# Patient Record
Sex: Male | Born: 1980 | Race: Black or African American | Hispanic: No | Marital: Single | Smoking: Never smoker
Health system: Southern US, Community
[De-identification: ages and names within clinical notes are randomized; demographics above are authoritative.]

## PROBLEM LIST (undated history)

## (undated) DIAGNOSIS — T8859XA Other complications of anesthesia, initial encounter: Secondary | ICD-10-CM

## (undated) DIAGNOSIS — I10 Essential (primary) hypertension: Secondary | ICD-10-CM

## (undated) DIAGNOSIS — T4145XA Adverse effect of unspecified anesthetic, initial encounter: Secondary | ICD-10-CM

## (undated) DIAGNOSIS — I8390 Asymptomatic varicose veins of unspecified lower extremity: Secondary | ICD-10-CM

## (undated) HISTORY — DX: Asymptomatic varicose veins of unspecified lower extremity: I83.90

## (undated) HISTORY — PX: OTHER SURGICAL HISTORY: SHX169

---

## 2012-09-01 ENCOUNTER — Encounter (HOSPITAL_COMMUNITY): Payer: Self-pay | Admitting: Pharmacy Technician

## 2012-09-01 ENCOUNTER — Encounter (HOSPITAL_COMMUNITY): Payer: Self-pay

## 2012-09-01 ENCOUNTER — Encounter (HOSPITAL_COMMUNITY)
Admission: RE | Admit: 2012-09-01 | Discharge: 2012-09-01 | Disposition: A | Discharge status: Home / Self Care | Payer: Self-pay | Source: Ambulatory Visit | Attending: Orthopaedic Surgery | Admitting: Orthopaedic Surgery

## 2012-09-01 HISTORY — DX: Adverse effect of unspecified anesthetic, initial encounter: T41.45XA

## 2012-09-01 HISTORY — DX: Other complications of anesthesia, initial encounter: T88.59XA

## 2012-09-01 LAB — URINALYSIS, ROUTINE W REFLEX MICROSCOPIC
Ketones, ur: NEGATIVE mg/dL
Leukocytes, UA: NEGATIVE
Nitrite: NEGATIVE
Urobilinogen, UA: 1 mg/dL (ref 0.0–1.0)
pH: 6 (ref 5.0–8.0)

## 2012-09-01 LAB — CBC
MCH: 29 pg (ref 26.0–34.0)
MCV: 89.1 fL (ref 78.0–100.0)
Platelets: 254 10*3/uL (ref 150–400)
RDW: 12.6 % (ref 11.5–15.5)

## 2012-09-01 LAB — COMPREHENSIVE METABOLIC PANEL
AST: 22 U/L (ref 0–37)
Albumin: 3.4 g/dL — ABNORMAL LOW (ref 3.5–5.2)
CO2: 25 mEq/L (ref 19–32)
Calcium: 9.4 mg/dL (ref 8.4–10.5)
Creatinine, Ser: 0.89 mg/dL (ref 0.50–1.35)
GFR calc non Af Amer: >90 mL/min (ref >90)

## 2012-09-01 LAB — PROTIME-INR: INR: 0.98 (ref 0.00–1.49)

## 2012-09-01 NOTE — Progress Notes (Signed)
Pt doesn't have a cardiologist   Denies ever having a stress test/echo/heart cath  [facility] is Medical MD facility and sees Mady Gemma PA  Denies ekg or cxr within past

## 2012-09-01 NOTE — Pre-Procedure Instructions (Signed)
KOLBIE LEPKOWSKI  09/01/2012   Your procedure is scheduled on:  Thurs, Feb 6 @ 1:00 PM  Report to [facility] at 11:00 AM.  Call this number if you have problems the morning of surgery: [phone]   Remember:   Do not eat food or drink liquids after midnight.   Take these medicines the morning of surgery with A SIP OF WATER: Pain Pill(if needed)   Do not wear jewelry  Do not wear lotions, powders, or colognes. You may wear deodorant.  Men may shave face and neck.  Do not bring valuables to the hospital.  Contacts, dentures or bridgework may not be worn into surgery.  Leave suitcase in the car. After surgery it may be brought to your room.  For patients admitted to the hospital, checkout time is 11:00 AM the day of  discharge.   Patients discharged the day of surgery will not be allowed to drive  home.    Special Instructions: Shower using CHG 2 nights before surgery and the night before surgery.  If you shower the day of surgery use CHG.  Use special wash - you have one bottle of CHG for all showers.  You should use approximately 1/3 of the bottle for each shower.   Please read over the following fact sheets that you were given: Pain Booklet, Coughing and Deep Breathing, MRSA Information and Surgical Site Infection Prevention

## 2012-09-01 NOTE — Progress Notes (Signed)
09/01/12 1506  OBSTRUCTIVE SLEEP APNEA  Have you ever been diagnosed with sleep apnea through a sleep study? No  Do you snore loudly (loud enough to be heard through closed doors)?  1  Do you often feel tired, fatigued, or sleepy during the daytime? 0  Has anyone observed you stop breathing during your sleep? 0  Do you have, or are you being treated for high blood pressure? 0  BMI more than 35 kg/m2? 1  Age over 31 years old? 0  Neck circumference greater than 40 cm/18 inches? 1 (20)  Gender: 1  Obstructive Sleep Apnea Score 4   Score 4 or greater  Results sent to PCP

## 2012-09-01 NOTE — Progress Notes (Signed)
Dr.Singer notified of pt hx with anesthesia at age 32;he spoke with pt and no new orders obtained;ok to proceed with planned surgery

## 2012-09-02 ENCOUNTER — Encounter (HOSPITAL_COMMUNITY): Payer: Self-pay | Admitting: Anesthesiology

## 2012-09-02 ENCOUNTER — Encounter (HOSPITAL_COMMUNITY)
Admission: RE | Disposition: A | Discharge status: Home / Self Care | Payer: Self-pay | Source: Ambulatory Visit | Attending: Orthopaedic Surgery

## 2012-09-02 ENCOUNTER — Ambulatory Visit (HOSPITAL_COMMUNITY): Payer: No Typology Code available for payment source | Admitting: Anesthesiology

## 2012-09-02 ENCOUNTER — Ambulatory Visit (HOSPITAL_COMMUNITY)
Admission: RE | Admit: 2012-09-02 | Discharge: 2012-09-02 | Disposition: A | Discharge status: Home / Self Care | Payer: No Typology Code available for payment source | Source: Ambulatory Visit | Attending: Orthopaedic Surgery | Admitting: Orthopaedic Surgery

## 2012-09-02 ENCOUNTER — Encounter (HOSPITAL_COMMUNITY): Payer: Self-pay | Admitting: *Deleted

## 2012-09-02 DIAGNOSIS — S82899A Other fracture of unspecified lower leg, initial encounter for closed fracture: Secondary | ICD-10-CM | POA: Insufficient documentation

## 2012-09-02 DIAGNOSIS — W19XXXA Unspecified fall, initial encounter: Secondary | ICD-10-CM | POA: Insufficient documentation

## 2012-09-02 DIAGNOSIS — Y9229 Other specified public building as the place of occurrence of the external cause: Secondary | ICD-10-CM | POA: Insufficient documentation

## 2012-09-02 DIAGNOSIS — S82843A Displaced bimalleolar fracture of unspecified lower leg, initial encounter for closed fracture: Secondary | ICD-10-CM

## 2012-09-02 DIAGNOSIS — Y998 Other external cause status: Secondary | ICD-10-CM | POA: Insufficient documentation

## 2012-09-02 DIAGNOSIS — Z6841 Body Mass Index (BMI) 40.0 and over, adult: Secondary | ICD-10-CM | POA: Insufficient documentation

## 2012-09-02 HISTORY — PX: ORIF ANKLE FRACTURE: SHX5408

## 2012-09-02 SURGERY — OPEN REDUCTION INTERNAL FIXATION (ORIF) ANKLE FRACTURE
Anesthesia: General | Site: Leg Lower | Laterality: Left | Wound class: Clean

## 2012-09-02 MED ORDER — GLYCOPYRROLATE 0.2 MG/ML IJ SOLN
INTRAMUSCULAR | Status: DC | PRN
  Administered 2012-09-02: .6 mg via INTRAVENOUS

## 2012-09-02 MED ORDER — ROCURONIUM BROMIDE 100 MG/10ML IV SOLN
INTRAVENOUS | Status: DC | PRN
  Administered 2012-09-02: 50 mg via INTRAVENOUS

## 2012-09-02 MED ORDER — PROPOFOL 10 MG/ML IV BOLUS
INTRAVENOUS | Status: DC | PRN
  Administered 2012-09-02: 350 mg via INTRAVENOUS
  Administered 2012-09-02: 50 mg via INTRAVENOUS

## 2012-09-02 MED ORDER — HYDROMORPHONE HCL PF 1 MG/ML IJ SOLN
INTRAMUSCULAR | Status: AC
  Filled 2012-09-02: qty 1

## 2012-09-02 MED ORDER — BUPIVACAINE-EPINEPHRINE 0.5% -1:200000 IJ SOLN
INTRAMUSCULAR | Status: DC | PRN
  Administered 2012-09-02: 25 mL

## 2012-09-02 MED ORDER — ONDANSETRON HCL 4 MG/2ML IJ SOLN: 4.0000 mg | Freq: Once | INTRAMUSCULAR | Status: DC | PRN

## 2012-09-02 MED ORDER — ARTIFICIAL TEARS OP OINT
TOPICAL_OINTMENT | OPHTHALMIC | Status: DC | PRN
  Administered 2012-09-02: 1 via OPHTHALMIC

## 2012-09-02 MED ORDER — ONDANSETRON HCL 4 MG/2ML IJ SOLN
INTRAMUSCULAR | Status: DC | PRN
  Administered 2012-09-02: 4 mg via INTRAVENOUS

## 2012-09-02 MED ORDER — OXYCODONE-ACETAMINOPHEN 5-325 MG PO TABS: 1.0000 | ORAL_TABLET | ORAL | Status: DC | PRN

## 2012-09-02 MED ORDER — OXYCODONE HCL 5 MG PO TABS: 5.0000 mg | ORAL_TABLET | Freq: Once | ORAL | Status: DC | PRN

## 2012-09-02 MED ORDER — MIDAZOLAM HCL 5 MG/5ML IJ SOLN
INTRAMUSCULAR | Status: DC | PRN
  Administered 2012-09-02: 2 mg via INTRAVENOUS

## 2012-09-02 MED ORDER — VANCOMYCIN HCL 10 G IV SOLR
1500.0000 mg | INTRAVENOUS | Status: AC
  Administered 2012-09-02: 1500 mg via INTRAVENOUS
  Filled 2012-09-02: qty 1500

## 2012-09-02 MED ORDER — SUCCINYLCHOLINE CHLORIDE 20 MG/ML IJ SOLN
INTRAMUSCULAR | Status: DC | PRN
  Administered 2012-09-02: 100 mg via INTRAVENOUS

## 2012-09-02 MED ORDER — HYDROMORPHONE HCL PF 1 MG/ML IJ SOLN
0.2500 mg | INTRAMUSCULAR | Status: DC | PRN
  Administered 2012-09-02 (×2): 0.5 mg via INTRAVENOUS

## 2012-09-02 MED ORDER — FENTANYL CITRATE 0.05 MG/ML IJ SOLN
INTRAMUSCULAR | Status: DC | PRN
  Administered 2012-09-02: 100 ug via INTRAVENOUS
  Administered 2012-09-02: 150 ug via INTRAVENOUS
  Administered 2012-09-02: 100 ug via INTRAVENOUS
  Administered 2012-09-02 (×3): 50 ug via INTRAVENOUS

## 2012-09-02 MED ORDER — 0.9 % SODIUM CHLORIDE (POUR BTL) OPTIME
TOPICAL | Status: DC | PRN
  Administered 2012-09-02: 1000 mL

## 2012-09-02 MED ORDER — NEOSTIGMINE METHYLSULFATE 1 MG/ML IJ SOLN
INTRAMUSCULAR | Status: DC | PRN
  Administered 2012-09-02: 5 mg via INTRAVENOUS

## 2012-09-02 MED ORDER — SODIUM CHLORIDE 0.9 % IV SOLN: INTRAVENOUS | Status: DC

## 2012-09-02 MED ORDER — OXYCODONE HCL 5 MG/5ML PO SOLN: 5.0000 mg | Freq: Once | ORAL | Status: DC | PRN

## 2012-09-02 MED ORDER — LACTATED RINGERS IV SOLN
INTRAVENOUS | Status: DC
  Administered 2012-09-02 (×2): via INTRAVENOUS

## 2012-09-02 MED ORDER — SODIUM CHLORIDE 0.9 % IV SOLN
INTRAVENOUS | Status: DC | PRN
  Administered 2012-09-02: 14:00:00 via INTRAVENOUS

## 2012-09-02 MED ORDER — ACETAMINOPHEN 10 MG/ML IV SOLN
INTRAVENOUS | Status: AC
  Filled 2012-09-02: qty 100

## 2012-09-02 MED ORDER — ACETAMINOPHEN 10 MG/ML IV SOLN
1000.0000 mg | Freq: Once | INTRAVENOUS | Status: AC
  Administered 2012-09-02: 1000 mg via INTRAVENOUS
  Filled 2012-09-02: qty 100

## 2012-09-02 MED ORDER — BUPIVACAINE-EPINEPHRINE PF 0.5-1:200000 % IJ SOLN
INTRAMUSCULAR | Status: AC
  Filled 2012-09-02: qty 30

## 2012-09-02 SURGICAL SUPPLY — 79 items
BANDAGE ELASTIC 4 VELCRO ST LF (GAUZE/BANDAGES/DRESSINGS) IMPLANT
BANDAGE ELASTIC 6 VELCRO ST LF (GAUZE/BANDAGES/DRESSINGS) IMPLANT
BANDAGE ESMARK 6X9 LF (GAUZE/BANDAGES/DRESSINGS) ×1 IMPLANT
BANDAGE GAUZE ELAST BULKY 4 IN (GAUZE/BANDAGES/DRESSINGS) ×4 IMPLANT
BIT DRILL 2.5X2.75 QC CALB (BIT) ×1 IMPLANT
BIT DRILL 3.5X5.5 QC CALB (BIT) ×1 IMPLANT
BIT DRILL CALIBRATED 2.7 (BIT) ×1 IMPLANT
BLADE SURG 10 STRL SS (BLADE) ×3 IMPLANT
BLADE SURG 15 STRL LF DISP TIS (BLADE) IMPLANT
BLADE SURG 15 STRL SS (BLADE) ×4
BNDG CMPR 9X6 STRL LF SNTH (GAUZE/BANDAGES/DRESSINGS) ×1
BNDG COHESIVE 6X5 TAN STRL LF (GAUZE/BANDAGES/DRESSINGS) ×1 IMPLANT
BNDG ESMARK 6X9 LF (GAUZE/BANDAGES/DRESSINGS) ×2
CLOTH BEACON ORANGE TIMEOUT ST (SAFETY) ×2 IMPLANT
COVER SURGICAL LIGHT HANDLE (MISCELLANEOUS) ×2 IMPLANT
CUFF TOURNIQUET SINGLE 34IN LL (TOURNIQUET CUFF) IMPLANT
CUFF TOURNIQUET SINGLE 44IN (TOURNIQUET CUFF) ×1 IMPLANT
DRAPE OEC MINIVIEW 54X84 (DRAPES) ×2 IMPLANT
DRAPE ORTHO SPLIT 77X108 STRL (DRAPES) ×2
DRAPE SURG ORHT 6 SPLT 77X108 (DRAPES) IMPLANT
DRSG ADAPTIC 3X8 NADH LF (GAUZE/BANDAGES/DRESSINGS) IMPLANT
DRSG PAD ABDOMINAL 8X10 ST (GAUZE/BANDAGES/DRESSINGS) IMPLANT
DURAPREP 26ML APPLICATOR (WOUND CARE) ×2 IMPLANT
ELECT REM PT RETURN 9FT ADLT (ELECTROSURGICAL) ×2
ELECTRODE REM PT RTRN 9FT ADLT (ELECTROSURGICAL) ×1 IMPLANT
FACESHIELD LNG OPTICON STERILE (SAFETY) ×1 IMPLANT
FIXATION ZIPTIGHT ANKLE SNDSMS (Ankle) IMPLANT
GAUZE XEROFORM 5X9 LF (GAUZE/BANDAGES/DRESSINGS) ×1 IMPLANT
GLOVE BIOGEL PI IND STRL 6.5 (GLOVE) IMPLANT
GLOVE BIOGEL PI IND STRL 7.0 (GLOVE) IMPLANT
GLOVE BIOGEL PI IND STRL 8 (GLOVE) ×1 IMPLANT
GLOVE BIOGEL PI IND STRL 8.5 (GLOVE) IMPLANT
GLOVE BIOGEL PI INDICATOR 6.5 (GLOVE) ×1
GLOVE BIOGEL PI INDICATOR 7.0 (GLOVE) ×1
GLOVE BIOGEL PI INDICATOR 8 (GLOVE) ×1
GLOVE BIOGEL PI INDICATOR 8.5 (GLOVE) ×2
GLOVE ECLIPSE 8.0 STRL XLNG CF (GLOVE) ×2 IMPLANT
GLOVE ECLIPSE 8.5 STRL (GLOVE) ×1 IMPLANT
GOWN PREVENTION PLUS XLARGE (GOWN DISPOSABLE) ×4 IMPLANT
GOWN STRL NON-REIN LRG LVL3 (GOWN DISPOSABLE) ×2 IMPLANT
KIT BASIN OR (CUSTOM PROCEDURE TRAY) ×2 IMPLANT
KIT ROOM TURNOVER OR (KITS) ×2 IMPLANT
MANIFOLD NEPTUNE II (INSTRUMENTS) ×1 IMPLANT
NDL KEITH (NEEDLE) IMPLANT
NEEDLE 22X1 1/2 (OR ONLY) (NEEDLE) IMPLANT
NEEDLE KEITH (NEEDLE) IMPLANT
NS IRRIG 1000ML POUR BTL (IV SOLUTION) ×2 IMPLANT
PACK ORTHO EXTREMITY (CUSTOM PROCEDURE TRAY) ×2 IMPLANT
PAD ARMBOARD 7.5X6 YLW CONV (MISCELLANEOUS) ×4 IMPLANT
PAD CAST 4YDX4 CTTN HI CHSV (CAST SUPPLIES) IMPLANT
PADDING CAST COTTON 4X4 STRL (CAST SUPPLIES) ×4
PLATE LOCK 7H 92 BILAT FIB (Plate) ×1 IMPLANT
SCREW CORTICAL 3.5MM  20MM (Screw) ×1 IMPLANT
SCREW CORTICAL 3.5MM 20MM (Screw) IMPLANT
SCREW LOCK CORT STAR 3.5X14 (Screw) ×1 IMPLANT
SCREW LOW PROFILE 18MMX3.5MM (Screw) ×1 IMPLANT
SPONGE GAUZE 4X4 12PLY (GAUZE/BANDAGES/DRESSINGS) IMPLANT
SPONGE LAP 4X18 X RAY DECT (DISPOSABLE) ×3 IMPLANT
STAPLER VISISTAT 35W (STAPLE) IMPLANT
STRIP CLOSURE SKIN 1/2X4 (GAUZE/BANDAGES/DRESSINGS) IMPLANT
SUCTION FRAZIER TIP 10 FR DISP (SUCTIONS) ×2 IMPLANT
SUT ETHIBOND CT1 BRD 2-0 30IN (SUTURE) IMPLANT
SUT ETHIBOND NAB BRD #0 18IN (SUTURE) IMPLANT
SUT MNCRL AB 3-0 PS2 18 (SUTURE) ×1 IMPLANT
SUT PROLENE 3 0 PS 2 (SUTURE) IMPLANT
SUT VIC AB 0 CT1 18XCR BRD 8 (SUTURE) IMPLANT
SUT VIC AB 0 CT1 8-18 (SUTURE) ×2
SUT VIC AB 2-0 CT1 27 (SUTURE)
SUT VIC AB 2-0 CT1 TAPERPNT 27 (SUTURE) IMPLANT
SUT VIC AB 2-0 CTB1 (SUTURE) IMPLANT
SUT VIC AB 3-0 FS2 27 (SUTURE) IMPLANT
SUT VIC AB 3-0 PS2 18 (SUTURE)
SUT VIC AB 3-0 PS2 18XBRD (SUTURE) IMPLANT
SYR CONTROL 10ML LL (SYRINGE) IMPLANT
TOWEL OR 17X24 6PK STRL BLUE (TOWEL DISPOSABLE) ×2 IMPLANT
TOWEL OR 17X26 10 PK STRL BLUE (TOWEL DISPOSABLE) ×2 IMPLANT
TUBE CONNECTING 12X1/4 (SUCTIONS) ×2 IMPLANT
WATER STERILE IRR 1000ML POUR (IV SOLUTION) ×2 IMPLANT
ZIPTIGHT ANKLE SYNODESMOSS FIX (Ankle) ×2 IMPLANT

## 2012-09-02 NOTE — Op Note (Signed)
NAMECHALMERS, IDDINGS NO.:  [id number]  MEDICAL RECORD NO.:  [id number]  LOCATION:  [facility]                         FACILITY:  [facility]  PHYSICIAN:  Claude Manges. Mckenna Gamm, M.D.DATE OF BIRTH:  07/23/1981  DATE OF PROCEDURE:  09/02/2012 DATE OF DISCHARGE:  09/02/2012                              OPERATIVE REPORT   PREOPERATIVE DIAGNOSIS:  Displaced left distal fibular fracture associated with diastasis with distal tib-fib articulation and morbid obesity with BMI 62.  POSTOPERATIVE DIAGNOSIS:  Displaced left distal fibular fracture associated with diastasis with distal tib-fib articulation and morbid obesity with BMI 62.  PROCEDURE:  Open reduction and internal fixation, left distal tib-fib fracture with fixation of tib-fib diastasis.  SURGEON:  Claude Manges. Cleophas Dunker, M.D.  ASSISTANT:  Arlys John D. Petrarca, P.A.-C.  ANESTHESIA:  General.  COMPLICATIONS:  None.  HISTORY:  This morbidly obese 32 year old young man sustained injury to his left ankle at a convenience store one week ago.  He apparently fell with acute onset of pain, was seen at the [facility] emergency room.  X-rays revealed a displaced distal fibular fracture associated with diastasis.  Because of his size he was referred to our office for treatment at [facility].  He is here for open reduction and internal fixation.  PROCEDURE IN DETAIL:  Mr. Garciagarcia was met in the holding area, identified the left leg as appropriate operative site.  He did have a splint in place.  I removed the splint.  Skin was intact.  The patient was then transported to room #10 and placed under general anesthesia without complication. A bolster was placed beneath his left hip.  We applied a tourniquet to his left leg proximally.  The left leg was then prepped with chlorhexidine scrub and then DuraPrep from the tourniquet to the tips of the toes.  Sterile draping was performed.  The extremity was still elevated,  Esmarch exsanguinated with a proximal tourniquet at 325 mmHg.  Longitudinal incision was made along the distal fibula via sharp dissection, carried down to subcutaneous tissue.  There were multiple small varicosities that were identified and Bovie coagulated.  The deep fascia was incised.  Peroneal tendons were identified and retracted posteriorly.  At that point, the fascia overlying the fibula was identified and carefully incised.  Retractors were placed above the fibula.  Fracture was visualized and reduced under direct visualization, and maintained with a bone clamp.  We elected to use the Ace/DePuy 7-hole titanium side plate, this was affixed to the fibula with the bone clamps.  X-rays reveal excellent position.  Because of the diastasis, we elected to use the very last hole in the plate to fuse the tight rope device.  Three holes were placed proximal to the fracture and 3 distally.  The very first hole of the distal 4 was placed directly over the fracture. We did insert a trans-reduction screw obliquely across the fracture, over drilling the proximal cortex using a 3.5 nonlocking screw.  We checked the position of the plate, thought it was in perfect position.  The 3 holes were then drilled, measured, and filled with screws proximally and then distally.  The 2 holes were drilled, measured,  and filled with the locking screws.  X-rays reveal excellent position of the fracture.  The 1 screw hole was left open at the level of the fracture.  The very distal screw hole was used for tight rope device.  The tight rope guide pin was then inserted through the distal hole across the fibula across the tibia and checked with image intensification, was in perfect position parallel to the ankle joint.  We then delivered the tight rope device through the fibula and the tibia.  We made a small hole medially to view the distal tibia and place the tightrope device on the periosteum and the foot  was dorsiflexed and everted.  We rechecked x-rays and thought that the diastasis was perfectly reduced and then we tightened the tight rope in place over the screw hole.  At that point, we again checked the films in excellent position.  Fracture was stable.  Wound was irrigated with saline solution.  I closed the small half-inch medial incision with 3-0 Monocryl and 2 clips.  We closed the lateral incision anatomically with 0 Vicryl and deep fascia with 3-0 Monocryl, and then skin clips.  The tourniquet was deflated prior to wound closure.  The small bleeders were Bovie coagulated.  There was immediate capillary refill to the toes.  I infiltrated the wounds with 2.5% Marcaine with epinephrine.  A sterile bulky dressing was applied followed by fluff dressing and then U-splints and posterior splint.  The patient tolerated the procedure without complications.  If he is stable in the recovery room, I will plan to send him home on oxycodone for pain.  He has crutches and we will plan to see him in the office in 4-5 days.     Claude Manges. Cleophas Dunker, M.D.     PWW/MEDQ  D:  09/02/2012  T:  09/02/2012  Job:  [id number]

## 2012-09-02 NOTE — Op Note (Signed)
PATIENT ID:      Bryan [name]  MRN:     [id number] DOB/AGE:    May 23, 1981 / 31 y.o.       OPERATIVE REPORT    DATE OF PROCEDURE:  09/02/2012       PREOPERATIVE DIAGNOSIS:   displaced left distal tibia/fibula with diastasis distal tib-fib                                                        Morbid obesity with BMI 62                                                       There is no height or weight on file to calculate BMI.     POSTOPERATIVE DIAGNOSIS:   displaced left distal tibia/fibula with diastasis                                                                      There is no height or weight on file to calculate BMI.                                                            Same   PROCEDURE:  Procedure(s): OPEN REDUCTION INTERNAL FIXATION (ORIF) ANKLE FRACTURE DISTAL FIBULA FRACTURE WITH FIXATION OF TIB-FIB DIASTASIS     SURGEON:  Norlene Campbell, MD    ASSISTANT:   Jacqualine Code, PA-C   (Present and scrubbed throughout the case, critical for assistance with exposure, retraction, instrumentation, and closure.)          ANESTHESIA: general     DRAINS: none :      TOURNIQUET TIME:  Total Tourniquet Time Documented: Calf (Left) - 71 minutes    COMPLICATIONS:  None   CONDITION:  stable  PROCEDURE IN DETAIL: [id number]   Bryan [name] 09/02/2012, 3:44 PM

## 2012-09-02 NOTE — Transfer of Care (Signed)
Immediate Anesthesia Transfer of Care Note  Patient: Bryan [name]  Procedure(s) Performed: Procedure(s) (LRB) with comments: OPEN REDUCTION INTERNAL FIXATION (ORIF) ANKLE FRACTURE (Left) - OPEN REDUCTION INTERNAL FIXATION LEFT DISTAL FIBULA FRACTURE WITH REDUCTION FIXATION DISTAL TIBIA/FIBULAR DIASTASIS   Patient Location: PACU  Anesthesia Type:General  Level of Consciousness: awake, alert , oriented and patient cooperative  Airway & Oxygen Therapy: Patient Spontanous Breathing and Patient connected to face mask oxygen  Post-op Assessment: Report given to PACU RN and Post -op Vital signs reviewed and stable  Post vital signs: Reviewed and stable  Complications: No apparent anesthesia complications

## 2012-09-02 NOTE — Anesthesia Preprocedure Evaluation (Addendum)
Anesthesia Evaluation  Patient identified by MRN, date of birth, ID band Patient awake    Reviewed: Allergy & Precautions, H&P , NPO status , Patient's Chart, lab work & pertinent test results  Airway Mallampati: I TM Distance: >3 FB     Dental  (+) Teeth Intact and Dental Advisory Given   Pulmonary neg pulmonary ROS,  breath sounds clear to auscultation        Cardiovascular negative cardio ROS  Rhythm:Regular Rate:Normal     Neuro/Psych negative neurological ROS  negative psych ROS   GI/Hepatic negative GI ROS, Neg liver ROS,   Endo/Other  Morbid obesity  Renal/GU negative Renal ROS     Musculoskeletal negative musculoskeletal ROS (+)   Abdominal   Peds  Hematology negative hematology ROS (+)   Anesthesia Other Findings   Reproductive/Obstetrics                          Anesthesia Physical Anesthesia Plan  ASA: III  Anesthesia Plan: General   Post-op Pain Management:    Induction: Intravenous  Airway Management Planned: Oral ETT and Video Laryngoscope Planned  Additional Equipment:   Intra-op Plan:   Post-operative Plan: Extubation in OR  Informed Consent: I have reviewed the patients History and Physical, chart, labs and discussed the procedure including the risks, benefits and alternatives for the proposed anesthesia with the patient or authorized representative who has indicated his/her understanding and acceptance.   Dental advisory given  Plan Discussed with: CRNA, Anesthesiologist and Surgeon  Anesthesia Plan Comments:         Anesthesia Quick Evaluation

## 2012-09-02 NOTE — Anesthesia Postprocedure Evaluation (Signed)
Anesthesia Post Note  Patient: Bryan [name]  Procedure(s) Performed: Procedure(s) (LRB): OPEN REDUCTION INTERNAL FIXATION (ORIF) ANKLE FRACTURE (Left)  Anesthesia type: General  Patient location: PACU  Post pain: Pain level controlled  Post assessment: Patient's Cardiovascular Status Stable  Last Vitals:  Filed Vitals:   09/02/12 1630  BP: 180/81  Pulse: 97  Temp:   Resp:     Post vital signs: Reviewed and stable  Level of consciousness: alert  Complications: No apparent anesthesia complications

## 2012-09-02 NOTE — Anesthesia Procedure Notes (Signed)
Procedure Name: Intubation Date/Time: 09/02/2012 1:53 PM Performed by: Leona Singleton A Pre-anesthesia Checklist: Patient identified Patient Re-evaluated:Patient Re-evaluated prior to inductionOxygen Delivery Method: Circle system utilized Preoxygenation: Pre-oxygenation with 100% oxygen Intubation Type: IV induction and Rapid sequence Tube type: Oral Tube size: 8.0 mm Number of attempts: 1 Airway Equipment and Method: Patient positioned with wedge pillow,  Stylet and Video-laryngoscopy Placement Confirmation: ETT inserted through vocal cords under direct vision,  positive ETCO2,  CO2 detector and breath sounds checked- equal and bilateral Secured at: 24 cm Tube secured with: Tape Dental Injury: Teeth and Oropharynx as per pre-operative assessment  Difficulty Due To: Difficulty was anticipated, Difficult Airway- due to large tongue and Difficult Airway- due to reduced neck mobility Future Recommendations: Recommend- induction with short-acting agent, and alternative techniques readily available

## 2012-09-02 NOTE — H&P (Signed)
Norlene Campbell, MD   Jacqualine Code, PA-C [address], [place], [place]  [address]                             [phone]   ORTHOPAEDIC HISTORY & PHYSICAL  Bryan [name] MRN:  [id number] DOB/SEX:  Apr 04, 1981/male  CHIEF COMPLAINT:  Painful left ankle  HISTORY: Patient is a 32 y.o. male presented with a history of pain in the left ankle since last Thursday. Was at a convenience store and apparently fell and injured his left ankle and was noted to have a distal 1/3 fibula fracture with syndesmosis injury.  Was seen by another Orthopedist but the hospital would not provide anesthesia because of his obesity.  Now scheduled for ORIF left distal fibula and possible tight rope placement.  PAST MEDICAL HISTORY: There are no active problems to display for this patient.  Past Medical History  Diagnosis Date  . Complication of anesthesia     mom states they almost lost him but not sure if he was given to much medication   Past Surgical History  Procedure Date  . Lipo around heart at age 53    pt states its bc they could hear his  heart beat     MEDICATIONS:   Prescriptions prior to admission  Medication Sig Dispense Refill  . oxyCODONE-acetaminophen (PERCOCET/ROXICET) 5-325 MG per tablet Take 1-2 tablets by mouth every 4 (four) hours as needed. For pain        ALLERGIES:   Allergies  Allergen Reactions  . Penicillins Swelling    REVIEW OF SYSTEMS:  A comprehensive review of systems was negative.   FAMILY HISTORY:  History reviewed. No pertinent family history.  SOCIAL HISTORY:   History  Substance Use Topics  . Smoking status: Never Smoker   . Smokeless tobacco: Current User    Types: Chew  . Alcohol Use: Yes     Comment: couple of times a week      EXAMINATION: Vital signs in last 24 hours: Temp:  [98 F (36.7 C)-98.5 F (36.9 C)] 98 F (36.7 C) (02/06 1048) Pulse Rate:  [88-92] 88  (02/06 1048) Resp:  [18-20] 18  (02/06 1048) BP:  (151-165)/(98-112) 160/98 mmHg (02/06 1052) SpO2:  [96 %] 96 % (02/06 1048) Weight:  [213.191 kg (470 lb)] 213.191 kg (470 lb) (02/05 1457) BMI 62.0  Head is normocephalic.   Eyes:  Pupils equal, round and reactive to light and accommodation.  Extraocular intact. ENT: Ears, nose, and throat were benign.   Neck: supple, no bruits were noted.   Chest: good expansion.   Lungs: essentially clear.   Cardiac: regular rhythm and rate, normal S1, S2.  No murmurs appreciated. Pulses :  1+ bilateral and symmetric in lower extremities. Abdomen is scaphoid, soft, nontender, no masses palpable, normal bowel sounds                  present. CNS:  He is oriented x3 and cranial nerves II-XII grossly intact. Breast, rectal, and genital exams: not performed and not indicated for an orthopedic evaluation. Musculoskeletal: Tender distal fibula and medial malleolus.  EHL, FHL intact.  Sensate to Light Touch   Imaging Review See HPI  ASSESSMENT: Distal 1/3 fibula fracture with probable syndesmosis injury Past Medical History  Diagnosis Date  . Complication of anesthesia     mom states they almost lost him but not sure if he was  given to much medication    PLAN: Plan for left ORIF distal fibula fracture with possible tight rope placement  The procedure,  risks, and benefits of total knee arthroplasty were presented and reviewed. The risks including but not limited to infection, blood clots, vascular and nerve injury, stiffness,  among others were discussed. The patient acknowledged the explanation, agreed to proceed.   PETRARCA,BRIAN 09/02/2012, 11:00 AM

## 2012-09-02 NOTE — Preoperative (Signed)
Beta Blockers   Reason not to administer Beta Blockers:Not Applicable 

## 2012-09-02 NOTE — Progress Notes (Signed)
Patient ID: Bryan [name], male   DOB: September 01, 1980, 32 y.o.   MRN: [id number] The recent History & Physical has been reviewed. I have personally examined the patient today. There is no interval change to the documented History & Physical. The patient would like to proceed with the procedure.  Kenyata Napier W 09/02/2012,  1:10 PM

## 2012-09-06 ENCOUNTER — Encounter (HOSPITAL_COMMUNITY): Payer: Self-pay | Admitting: Orthopaedic Surgery

## 2015-01-04 ENCOUNTER — Other Ambulatory Visit: Payer: Self-pay | Admitting: *Deleted

## 2015-01-04 DIAGNOSIS — I83893 Varicose veins of bilateral lower extremities with other complications: Secondary | ICD-10-CM

## 2015-01-09 ENCOUNTER — Encounter: Payer: Self-pay | Admitting: Surgery

## 2015-01-12 ENCOUNTER — Ambulatory Visit (INDEPENDENT_AMBULATORY_CARE_PROVIDER_SITE_OTHER): Payer: 59 | Admitting: Surgery

## 2015-01-12 ENCOUNTER — Encounter: Payer: Self-pay | Admitting: Surgery

## 2015-01-12 ENCOUNTER — Ambulatory Visit (HOSPITAL_COMMUNITY)
Admission: RE | Admit: 2015-01-12 | Discharge: 2015-01-12 | Disposition: A | Discharge status: Home / Self Care | Payer: 59 | Source: Ambulatory Visit | Attending: Surgery | Admitting: Surgery

## 2015-01-12 VITALS — BP 136/88 | HR 72 | Resp 18 | Ht 69.5 in | Wt >= 6400 oz

## 2015-01-12 DIAGNOSIS — I872 Venous insufficiency (chronic) (peripheral): Secondary | ICD-10-CM | POA: Diagnosis not present

## 2015-01-12 DIAGNOSIS — I83893 Varicose veins of bilateral lower extremities with other complications: Secondary | ICD-10-CM | POA: Insufficient documentation

## 2015-01-12 NOTE — Progress Notes (Signed)
Patient name: Bryan [name] MRN: [id number] DOB: 1981-04-01 Sex: male   Referred by: ER  Reason for referral:  Chief Complaint  Patient presents with  . Varicose Veins    c/o bleeding episodes (second episode 10 days ago right ankle seen at [facility] ER, stitches placed,  first episode 10 days prior to second episode left ankle, treated at [facility] ER, stitches placed)   . New Evaluation    HISTORY OF PRESENT ILLNESS: This is a 34 year old gentleman who comes in today for evaluation of bleeding varicose veins.  The patient has been in the emergency department most recently 1 week ago for bleeding from a right-sided varicose vein which required a suture.  He also has a history of bleeding in the left leg from a varicosity approximately 2 months ago.  This also required an ER visit.  The patient suffers from morbid obesity.  Past Medical History  Diagnosis Date  . Complication of anesthesia     mom states they almost lost him but not sure if he was given to much medication  . Varicose veins     mutilple bleeding episodes, bilateral lower extremities)    Past Surgical History  Procedure Laterality Date  . Lipo around heart  at age 8    pt states its bc they could hear his  heart beat  . Orif ankle fracture Left 09/02/2012    Procedure: OPEN REDUCTION INTERNAL FIXATION (ORIF) ANKLE FRACTURE;  Surgeon: Valeria Batman, MD;  Location: [facility] OR;  Service: Orthopedics;  Laterality: Left;  OPEN REDUCTION INTERNAL FIXATION LEFT DISTAL FIBULA FRACTURE WITH REDUCTION FIXATION DISTAL TIBIA/FIBULAR DIASTASIS     History   Social History  . Marital Status: Single    Spouse Name: N/A  . Number of Children: N/A  . Years of Education: N/A   Occupational History  . Not on file.   Social History Main Topics  . Smoking status: Never Smoker   . Smokeless tobacco: Current User    Types: Chew  . Alcohol Use: Yes     Comment: couple of times a week  . Drug Use: Not on  file  . Sexual Activity: No   Other Topics Concern  . Not on file   Social History Narrative    History reviewed. No pertinent family history.  Allergies as of 01/12/2015 - Review Complete 01/12/2015  Allergen Reaction Noted  . Penicillins Swelling 09/01/2012    Current Outpatient Prescriptions on File Prior to Visit  Medication Sig Dispense Refill  . oxyCODONE-acetaminophen (PERCOCET/ROXICET) 5-325 MG per tablet Take 1-2 tablets by mouth every 4 (four) hours as needed. For pain    . oxyCODONE-acetaminophen (PERCOCET/ROXICET) 5-325 MG per tablet Take 1-2 tablets by mouth every 4 (four) hours as needed. (Patient not taking: Reported on 01/12/2015) 60 tablet 0   No current facility-administered medications on file prior to visit.     REVIEW OF SYSTEMS: The history of present illness, otherwise negative   PHYSICAL EXAMINATION:  Filed Vitals:   01/12/15 1131  BP: 136/88  Pulse: 72  Resp: 18  Height: 5' 9.5" (1.765 m)  Weight: 431 lb 4.8 oz (195.636 kg)   Body mass index is 62.8 kg/(m^2). General: The patient appears their stated age.   HEENT:  No gross abnormalities Pulmonary: Respirations are non-labored Musculoskeletal: There are no major deformities.   Neurologic: No focal weakness or paresthesias are detected, Skin: There are no ulcer or rashes noted. Psychiatric: The patient has  normal affect. Cardiovascular: Pitting edema bilaterally.  There are numerous bulging varicosities which are extremely superficial embedded within the epidermis.  He has hyperpigmentation bilaterally around ankles.  Diagnostic Studies: Venous reflux evaluation has been performed.  On the right there is no deep vein reflux.  There is reflux in the right great saphenous vein near the saphenofemoral junction with a maximum diameter 1.03 cm.  There is also reflux within the small saphenous vein with maximum diameter of 0.69 cm.  On the left there is reflux within the common femoral vein as well  as a portion of the great saphenous vein in the midportion of the leg with maximum diameter of 0.95 cm.  Assessment:  Bilateral venous insufficiency with spontaneous bleeding from varicosities Plan: After examining the patient's legs, I feel that he has a very high risk for a recurrent bleed, given how superficial these veins are.  He does have evidence of bilateral superficial venous insufficiency.  Because of his body habitus, I am not sure how well he will do with compression stockings however, I did give him 20-30 thigh-high compression stocking.  He is going to follow up in the immediate future to discuss laser ablation of the incompetent superficial venous system as well as stab phlebectomy and sclerotherapy of the prominent varix on bilateral lower extremities.     Jorge Ny, M.D. [facility] Office: [phone] Pager:  [phone]

## 2015-01-15 ENCOUNTER — Encounter: Payer: Self-pay | Admitting: Vascular Surgery

## 2015-01-15 ENCOUNTER — Ambulatory Visit (INDEPENDENT_AMBULATORY_CARE_PROVIDER_SITE_OTHER): Payer: 59 | Admitting: Vascular Surgery

## 2015-01-15 VITALS — BP 154/97 | HR 80 | Resp 18 | Ht 70.0 in | Wt >= 6400 oz

## 2015-01-15 DIAGNOSIS — I83893 Varicose veins of bilateral lower extremities with other complications: Secondary | ICD-10-CM

## 2015-01-15 NOTE — Progress Notes (Signed)
Subjective:     Patient ID: Bryan [name], male   DOB: 12-Nov-1980, 34 y.o.   MRN: [id number]  HPI This 34 year old morbidly obese male was seen by Dr. Myra Gianotti last week. The patient has had bleeding varicosities in both legs over the last 3 weeks requiring trips to the emergency department with sutures of the bleeding sites. He has a long history of bulging varicosities and chronic edema with aching throbbing and burning discomfort and progressive swelling as the day progresses. Because of his size-460 pounds today jhe is unable to wear last compression stockings.  Past Medical History  Diagnosis Date  . Complication of anesthesia     mom states they almost lost him but not sure if he was given to much medication  . Varicose veins     mutilple bleeding episodes, bilateral lower extremities)    History  Substance Use Topics  . Smoking status: Never Smoker   . Smokeless tobacco: Current User    Types: Chew  . Alcohol Use: Yes     Comment: couple of times a week    History reviewed. No pertinent family history.  Allergies  Allergen Reactions  . Penicillins Swelling     Current outpatient prescriptions:  .  oxyCODONE-acetaminophen (PERCOCET/ROXICET) 5-325 MG per tablet, Take 1-2 tablets by mouth every 4 (four) hours as needed. For pain, Disp: , Rfl:  .  oxyCODONE-acetaminophen (PERCOCET/ROXICET) 5-325 MG per tablet, Take 1-2 tablets by mouth every 4 (four) hours as needed. (Patient not taking: Reported on 01/12/2015), Disp: 60 tablet, Rfl: 0  Filed Vitals:   01/15/15 1443 01/15/15 1444  BP: 150/104 154/97  Pulse: 87 80  Resp: 18 18  Height: 5\' 10"  (1.778 m)   Weight: 438 lb (198.675 kg)     Body mass index is 62.85 kg/(m^2).           Review of Systems  Denies active chest pain, dyspnea on exertion, PND, orthopnea.     Objective:   Physical Exam BP 154/97 mmHg  Pulse 80  Resp 18  Ht 5\' 10"  (1.778 m)  Wt 438 lb (198.675 kg)  BMI 62.85 kg/m2   general morbidly  obese male in no apparent stress alert and oriented 3 Lungs rhonchi or wheezing  Right leg with extensive bulging varicosities in the anterior thigh medial calf and posterior calf area with area of bleeding near medial malleolus and 1+ chronic edema with hyperpigmentation. 3+ dorsalis pedis pulse palpable. Left leg with bulging varicositiesmedial calf lateral thigh and bleeding site and lateral distal pretibial region with 1+ chronic edema.    formal duplex scan performed last week revealed gross reflux right small saphenous vein with this supplying the area where the bleeding occurred in the lower right leg   also demonstrated was gross reflux and a short segment of the right great saphenous vein and in certain areas of the distal left great saphenous vein.     Assessment:      history of bleeding varicosities both legs over the past 3 weeks requiring trips to the emergency department and suturing of bleeding sites. Patient has gross reflux right small saphenous vein with painful varicosities and history of bleeding and also has history of bleeding left leg. Patient cannot wear long leg elastic compression stockings because of size of his legs and needs to be done soon because of too recent bleeding sites and trips to emergency department     Plan:      patient needs number-one laser  ablation right small saphenous vein plus greater than 20 stab phlebectomy of painful varicosities and sclerotherapy of bleeding site followed by #2 sclerotherapy of bleeding site left leg  We'll procee with precertification perform this in the near future

## 2015-02-15 ENCOUNTER — Encounter: Payer: Self-pay | Admitting: Vascular Surgery

## 2015-02-19 ENCOUNTER — Ambulatory Visit (INDEPENDENT_AMBULATORY_CARE_PROVIDER_SITE_OTHER): Payer: 59 | Admitting: Vascular Surgery

## 2015-02-19 ENCOUNTER — Encounter: Payer: Self-pay | Admitting: Vascular Surgery

## 2015-02-19 VITALS — BP 146/92 | HR 97 | Temp 97.9°F | Resp 16 | Ht 72.0 in | Wt >= 6400 oz

## 2015-02-19 DIAGNOSIS — I83893 Varicose veins of bilateral lower extremities with other complications: Secondary | ICD-10-CM

## 2015-02-19 NOTE — Progress Notes (Signed)
Subjective:     Patient ID: Bryan [name], male   DOB: Apr 26, 1981, 34 y.o.   MRN: [id number]  HPI this 34 year old male had laser ablation of the right small saphenous vein from mid calf to near the saphenous popliteal junction +10-20 stab phlebectomy of painful varicosities all performed under local tumescent anesthesia. He had a history of a bleeding varix in the right ankle area recently.   Review of Systems     Objective:   Physical Exam BP 146/92 mmHg  Pulse 97  Temp(Src) 97.9 F (36.6 C)  Resp 16  Ht 6' (1.829 m)  Wt 438 lb (198.675 kg)  BMI 59.39 kg/m2  SpO2 100%       Assessment:     Laser ablation right small saphenous vein +10-20 stab phlebectomy of painful varicosities performed under local tumescent anesthesia    Plan:     Return in 1 week for venous duplex exam of right small saphenous vein confirmed closure Patient will also need sclerotherapy of bleeding site in right ankle and in left leg.

## 2015-02-19 NOTE — Progress Notes (Signed)
Laser Ablation Procedure    Date: 02/19/2015   Bryan [name] DOB:[birth year]  Consent signed: Yes    Surgeon:  Dr. Quita Skye. Hart [place]  Procedure: Laser Ablation: right Small Saphenous Vein  BP 146/92 mmHg  Pulse 97  Temp(Src) 97.9 F (36.6 C)  Resp 16  Ht 6' (1.829 m)  Wt 438 lb (198.675 kg)  BMI 59.39 kg/m2  SpO2 100%  Tumescent Anesthesia: 300 cc 0.9% NaCl with 50 cc Lidocaine HCL with 1% Epi and 15 cc 8.4% NaHCO3  Local Anesthesia: 7 cc Lidocaine HCL and NaHCO3 (ratio 2:1)  Pulsed Mode: 15 watts, delay, 1.0 duration  Total Energy: 913.15             Total Pulses:  61              Total Time: 1:01    Stab Phlebectomy: 10-20 Sites: Calf  Patient tolerated procedure well  Notes: Took 30 minutes for bleeding to stop from one of the stab sites.  Description of Procedure:  After marking the course of the secondary varicosities, the patient was placed on the operating table in the prone position, and the right leg was prepped and draped in sterile fashion.   Local anesthetic was administered and under ultrasound guidance the saphenous vein was accessed with a micro needle and guide wire; then the mirco puncture sheath was placed.  A guide wire was inserted saphenopopliteal junction , followed by a 5 french sheath.  The position of the sheath and then the laser fiber below the junction was confirmed using the ultrasound.  Tumescent anesthesia was administered along the course of the saphenous vein using ultrasound guidance. The patient was placed in Trendelenburg position and protective laser glasses were placed on patient and staff, and the laser was fired at 15 watts continuous mode advancing 1-75mm/second for a total of 913.15 joules.   For stab phlebectomies, local anesthetic was administered at the previously marked varicosities, and tumescent anesthesia was administered around the vessels.  Ten to 20 stab wounds were made using the tip of an 11 blade. And using the vein hook,  the phlebectomies were performed using a hemostat to avulse the varicosities.  Adequate hemostasis was achieved.   Decided to wait on doing sclerotherapy  Steri strips were applied to the stab wounds and ABD pads and thigh high compression stockings were applied.  Ace wrap bandages were applied over the phlebectomy sites and at the top of the saphenopopliteal junction. Blood loss was less than 15 cc.  The patient ambulated out of the operating room having tolerated the procedure well.

## 2015-02-20 ENCOUNTER — Telehealth: Payer: Self-pay | Admitting: *Deleted

## 2015-02-20 NOTE — Telephone Encounter (Signed)
Left a vm asking him to call me today to update me on how he is doing.

## 2015-02-21 ENCOUNTER — Other Ambulatory Visit: Payer: Self-pay | Admitting: *Deleted

## 2015-02-21 ENCOUNTER — Encounter: Payer: Self-pay | Admitting: Vascular Surgery

## 2015-02-21 DIAGNOSIS — I83893 Varicose veins of bilateral lower extremities with other complications: Secondary | ICD-10-CM

## 2015-02-26 ENCOUNTER — Ambulatory Visit (INDEPENDENT_AMBULATORY_CARE_PROVIDER_SITE_OTHER): Payer: Self-pay | Admitting: Vascular Surgery

## 2015-02-26 ENCOUNTER — Encounter: Payer: Self-pay | Admitting: Vascular Surgery

## 2015-02-26 ENCOUNTER — Ambulatory Visit (HOSPITAL_COMMUNITY)
Admission: RE | Admit: 2015-02-26 | Discharge: 2015-02-26 | Disposition: A | Discharge status: Home / Self Care | Payer: 59 | Source: Ambulatory Visit | Attending: Vascular Surgery | Admitting: Vascular Surgery

## 2015-02-26 VITALS — BP 140/88 | HR 74 | Temp 97.0°F | Resp 18 | Ht 73.0 in | Wt >= 6400 oz

## 2015-02-26 DIAGNOSIS — I83893 Varicose veins of bilateral lower extremities with other complications: Secondary | ICD-10-CM | POA: Diagnosis not present

## 2015-02-26 NOTE — Progress Notes (Signed)
Subjective:     Patient ID: Bryan [name], male   DOB: 03-07-1981, 34 y.o.   MRN: [id number]  HPI this 34 year old male had laser ablation of the right small saphenous vein plus multiple scope stab phlebectomy of painful varicosities and sclerotherapy of a bleeding site in the right ankle performed last week. He is in the office today for initial follow-up. He has had no chest pain, dyspnea on exertion, PND, orthopnea, or hemoptysis. He has had some mild to moderate discomfort in the posterior calf which is resolving. He has had no change in his distal edema. He did not take the ibuprofen as instructed.   Review of Systems     Objective:   Physical Exam BP 140/88 mmHg  Pulse 74  Temp(Src) 97 F (36.1 C)  Resp 18  Ht  (1.854 m)  Wt 438 lb (198.675 kg)  BMI 57.80 kg/m2  SpO2 99%  General obese male in no apparent distress alert and oriented 3 Lungs no rhonchi or wheezing Right leg with mild tenderness to palpation over small saphenous vein. Stab phlebectomy sites healing well. 1+ chronic edema distally unchanged.  Today I ordered a venous duplex exam of the right leg which I reviewed and interpreted. The small saphenous vein is closed up to the junction of the popliteal vein. There is no DVT. There is some reflux in the right great saphenous vein as previously noted.     Assessment:     Successful laser ablation right small saphenous vein with multiple stab phlebectomy performed under local tumescent anesthesia plus sclerotherapy of bleeding site    Plan:     Patient to return in the near future for sclerotherapy of bleeding site and contralateral left leg and this will complete his treatment regimen

## 2015-03-13 ENCOUNTER — Encounter: Payer: Self-pay | Admitting: *Deleted

## 2015-03-14 ENCOUNTER — Ambulatory Visit (INDEPENDENT_AMBULATORY_CARE_PROVIDER_SITE_OTHER): Payer: 59 | Admitting: *Deleted

## 2015-03-14 ENCOUNTER — Encounter: Payer: Self-pay | Admitting: Vascular Surgery

## 2015-03-14 DIAGNOSIS — I83893 Varicose veins of bilateral lower extremities with other complications: Secondary | ICD-10-CM

## 2015-03-14 NOTE — Progress Notes (Signed)
X=.5% Sotradecol administered with a 27g butterfly.  Patient received a total of 18cc.  Treated the majority of his veins. Did not do the thigh vessels on the side since the stockings won't cover those areas. Tol well. Follow prn. This was his last ins covered sclero tx.  Photos: No.  Compression stockings applied: Yes.  and aces.

## 2016-12-01 ENCOUNTER — Encounter (HOSPITAL_COMMUNITY): Payer: Self-pay

## 2016-12-01 ENCOUNTER — Emergency Department (HOSPITAL_COMMUNITY): Payer: BLUE CROSS/BLUE SHIELD

## 2016-12-01 ENCOUNTER — Inpatient Hospital Stay (HOSPITAL_COMMUNITY)
Admission: EM | Admit: 2016-12-01 | Discharge: 2016-12-26 | DRG: 003 | Disposition: A | Discharge status: Rehabilitation Facility | Payer: BLUE CROSS/BLUE SHIELD | Attending: Neurosurgery | Admitting: Neurosurgery

## 2016-12-01 DIAGNOSIS — J9811 Atelectasis: Secondary | ICD-10-CM | POA: Diagnosis not present

## 2016-12-01 DIAGNOSIS — T85598D Other mechanical complication of other gastrointestinal prosthetic devices, implants and grafts, subsequent encounter: Secondary | ICD-10-CM

## 2016-12-01 DIAGNOSIS — E876 Hypokalemia: Secondary | ICD-10-CM | POA: Diagnosis not present

## 2016-12-01 DIAGNOSIS — T83511A Infection and inflammatory reaction due to indwelling urethral catheter, initial encounter: Secondary | ICD-10-CM | POA: Diagnosis not present

## 2016-12-01 DIAGNOSIS — Z4659 Encounter for fitting and adjustment of other gastrointestinal appliance and device: Secondary | ICD-10-CM | POA: Diagnosis not present

## 2016-12-01 DIAGNOSIS — Z9289 Personal history of other medical treatment: Secondary | ICD-10-CM

## 2016-12-01 DIAGNOSIS — I6032 Nontraumatic subarachnoid hemorrhage from left posterior communicating artery: Principal | ICD-10-CM | POA: Diagnosis present

## 2016-12-01 DIAGNOSIS — I6789 Other cerebrovascular disease: Secondary | ICD-10-CM | POA: Diagnosis not present

## 2016-12-01 DIAGNOSIS — J9601 Acute respiratory failure with hypoxia: Secondary | ICD-10-CM

## 2016-12-01 DIAGNOSIS — R51 Headache: Secondary | ICD-10-CM

## 2016-12-01 DIAGNOSIS — G92 Toxic encephalopathy: Secondary | ICD-10-CM | POA: Diagnosis not present

## 2016-12-01 DIAGNOSIS — Z9911 Dependence on respirator [ventilator] status: Secondary | ICD-10-CM

## 2016-12-01 DIAGNOSIS — I10 Essential (primary) hypertension: Secondary | ICD-10-CM | POA: Diagnosis not present

## 2016-12-01 DIAGNOSIS — N390 Urinary tract infection, site not specified: Secondary | ICD-10-CM | POA: Diagnosis not present

## 2016-12-01 DIAGNOSIS — R519 Headache, unspecified: Secondary | ICD-10-CM

## 2016-12-01 DIAGNOSIS — R739 Hyperglycemia, unspecified: Secondary | ICD-10-CM | POA: Diagnosis present

## 2016-12-01 DIAGNOSIS — I82442 Acute embolism and thrombosis of left tibial vein: Secondary | ICD-10-CM | POA: Diagnosis not present

## 2016-12-01 DIAGNOSIS — Y846 Urinary catheterization as the cause of abnormal reaction of the patient, or of later complication, without mention of misadventure at the time of the procedure: Secondary | ICD-10-CM | POA: Diagnosis not present

## 2016-12-01 DIAGNOSIS — Y92230 Patient room in hospital as the place of occurrence of the external cause: Secondary | ICD-10-CM | POA: Diagnosis not present

## 2016-12-01 DIAGNOSIS — R509 Fever, unspecified: Secondary | ICD-10-CM

## 2016-12-01 DIAGNOSIS — L89893 Pressure ulcer of other site, stage 3: Secondary | ICD-10-CM | POA: Diagnosis not present

## 2016-12-01 DIAGNOSIS — E870 Hyperosmolality and hypernatremia: Secondary | ICD-10-CM | POA: Diagnosis not present

## 2016-12-01 DIAGNOSIS — R1312 Dysphagia, oropharyngeal phase: Secondary | ICD-10-CM | POA: Diagnosis not present

## 2016-12-01 DIAGNOSIS — Z0189 Encounter for other specified special examinations: Secondary | ICD-10-CM

## 2016-12-01 DIAGNOSIS — N179 Acute kidney failure, unspecified: Secondary | ICD-10-CM | POA: Diagnosis present

## 2016-12-01 DIAGNOSIS — Z95828 Presence of other vascular implants and grafts: Secondary | ICD-10-CM

## 2016-12-01 DIAGNOSIS — O2230 Deep phlebothrombosis in pregnancy, unspecified trimester: Secondary | ICD-10-CM

## 2016-12-01 DIAGNOSIS — Z720 Tobacco use: Secondary | ICD-10-CM

## 2016-12-01 DIAGNOSIS — I824Y9 Acute embolism and thrombosis of unspecified deep veins of unspecified proximal lower extremity: Secondary | ICD-10-CM | POA: Diagnosis not present

## 2016-12-01 DIAGNOSIS — L8990 Pressure ulcer of unspecified site, unspecified stage: Secondary | ICD-10-CM | POA: Insufficient documentation

## 2016-12-01 DIAGNOSIS — J398 Other specified diseases of upper respiratory tract: Secondary | ICD-10-CM

## 2016-12-01 DIAGNOSIS — Z978 Presence of other specified devices: Secondary | ICD-10-CM | POA: Diagnosis not present

## 2016-12-01 DIAGNOSIS — Z6841 Body Mass Index (BMI) 40.0 and over, adult: Secondary | ICD-10-CM | POA: Diagnosis not present

## 2016-12-01 DIAGNOSIS — T4275XA Adverse effect of unspecified antiepileptic and sedative-hypnotic drugs, initial encounter: Secondary | ICD-10-CM | POA: Diagnosis not present

## 2016-12-01 DIAGNOSIS — I5033 Acute on chronic diastolic (congestive) heart failure: Secondary | ICD-10-CM | POA: Diagnosis not present

## 2016-12-01 DIAGNOSIS — R55 Syncope and collapse: Secondary | ICD-10-CM

## 2016-12-01 DIAGNOSIS — Z880 Allergy status to penicillin: Secondary | ICD-10-CM

## 2016-12-01 DIAGNOSIS — I110 Hypertensive heart disease with heart failure: Secondary | ICD-10-CM | POA: Diagnosis present

## 2016-12-01 DIAGNOSIS — F419 Anxiety disorder, unspecified: Secondary | ICD-10-CM | POA: Diagnosis not present

## 2016-12-01 DIAGNOSIS — J810 Acute pulmonary edema: Secondary | ICD-10-CM | POA: Diagnosis not present

## 2016-12-01 DIAGNOSIS — J9600 Acute respiratory failure, unspecified whether with hypoxia or hypercapnia: Secondary | ICD-10-CM

## 2016-12-01 DIAGNOSIS — Z930 Tracheostomy status: Secondary | ICD-10-CM | POA: Diagnosis not present

## 2016-12-01 DIAGNOSIS — I609 Nontraumatic subarachnoid hemorrhage, unspecified: Secondary | ICD-10-CM

## 2016-12-01 DIAGNOSIS — I82432 Acute embolism and thrombosis of left popliteal vein: Secondary | ICD-10-CM | POA: Diagnosis not present

## 2016-12-01 DIAGNOSIS — I959 Hypotension, unspecified: Secondary | ICD-10-CM | POA: Diagnosis not present

## 2016-12-01 DIAGNOSIS — J9691 Respiratory failure, unspecified with hypoxia: Secondary | ICD-10-CM

## 2016-12-01 DIAGNOSIS — R269 Unspecified abnormalities of gait and mobility: Secondary | ICD-10-CM | POA: Diagnosis not present

## 2016-12-01 DIAGNOSIS — G9340 Encephalopathy, unspecified: Secondary | ICD-10-CM | POA: Diagnosis not present

## 2016-12-01 MED ORDER — NIMODIPINE 60 MG/20ML PO SOLN
60.0000 mg | ORAL | Status: AC
  Administered 2016-12-02 – 2016-12-22 (×118): 60 mg
  Filled 2016-12-01 (×130): qty 20

## 2016-12-01 MED ORDER — NIMODIPINE 30 MG PO CAPS
60.0000 mg | ORAL_CAPSULE | ORAL | Status: AC
  Administered 2016-12-20 – 2016-12-21 (×2): 60 mg via ORAL
  Filled 2016-12-01 (×12): qty 2

## 2016-12-01 MED ORDER — ACETAMINOPHEN 160 MG/5ML PO SOLN
650.0000 mg | ORAL | Status: DC | PRN
  Administered 2016-12-04 – 2016-12-18 (×23): 650 mg
  Filled 2016-12-01 (×24): qty 20.3

## 2016-12-01 MED ORDER — ACETAMINOPHEN 650 MG RE SUPP: 650.0000 mg | RECTAL | Status: DC | PRN

## 2016-12-01 MED ORDER — SODIUM CHLORIDE 0.9 % IV SOLN
INTRAVENOUS | Status: DC
  Administered 2016-12-02 – 2016-12-03 (×4): via INTRAVENOUS

## 2016-12-01 MED ORDER — ACETAMINOPHEN 325 MG PO TABS
650.0000 mg | ORAL_TABLET | ORAL | Status: DC | PRN
  Administered 2016-12-16 – 2016-12-23 (×3): 650 mg via ORAL
  Filled 2016-12-01 (×3): qty 2

## 2016-12-01 MED ORDER — PANTOPRAZOLE SODIUM 40 MG PO PACK
40.0000 mg | PACK | Freq: Every day | ORAL | Status: DC
  Administered 2016-12-03 – 2016-12-26 (×22): 40 mg
  Filled 2016-12-01 (×22): qty 20

## 2016-12-01 MED ORDER — IOPAMIDOL (ISOVUE-370) INJECTION 76%
INTRAVENOUS | Status: AC
  Filled 2016-12-01: qty 100

## 2016-12-01 MED ORDER — HYDROMORPHONE HCL 1 MG/ML IJ SOLN
0.5000 mg | INTRAMUSCULAR | Status: DC | PRN
  Administered 2016-12-02 (×2): 0.5 mg via INTRAVENOUS
  Filled 2016-12-01 (×2): qty 1

## 2016-12-01 MED ORDER — ONDANSETRON HCL 4 MG/2ML IJ SOLN
4.0000 mg | Freq: Four times a day (QID) | INTRAMUSCULAR | Status: DC | PRN
  Filled 2016-12-01: qty 2

## 2016-12-01 MED ORDER — IOPAMIDOL (ISOVUE-370) INJECTION 76%
100.0000 mL | Freq: Once | INTRAVENOUS | Status: AC | PRN
  Administered 2016-12-01: 100 mL via INTRAVENOUS

## 2016-12-01 MED ORDER — PANTOPRAZOLE SODIUM 40 MG PO TBEC: 40.0000 mg | DELAYED_RELEASE_TABLET | Freq: Every day | ORAL | Status: DC

## 2016-12-01 MED ORDER — LABETALOL HCL 5 MG/ML IV SOLN
20.0000 mg | Freq: Once | INTRAVENOUS | Status: DC
  Filled 2016-12-01: qty 4

## 2016-12-01 MED ORDER — ONDANSETRON 4 MG PO TBDP
4.0000 mg | ORAL_TABLET | Freq: Four times a day (QID) | ORAL | Status: DC | PRN
  Filled 2016-12-01: qty 1

## 2016-12-01 MED ORDER — STROKE: EARLY STAGES OF RECOVERY BOOK
Freq: Once | Status: AC
  Administered 2016-12-01: 22:00:00
  Filled 2016-12-01: qty 1

## 2016-12-01 MED ORDER — ACETAMINOPHEN-CODEINE #3 300-30 MG PO TABS: 1.0000 | ORAL_TABLET | ORAL | Status: DC | PRN

## 2016-12-01 MED ORDER — CLEVIDIPINE BUTYRATE 0.5 MG/ML IV EMUL
0.0000 mg/h | INTRAVENOUS | Status: DC
  Administered 2016-12-01: 1 mg/h via INTRAVENOUS
  Filled 2016-12-01: qty 50

## 2016-12-01 MED ORDER — NICARDIPINE HCL IN NACL 20-0.86 MG/200ML-% IV SOLN
0.0000 mg/h | INTRAVENOUS | Status: DC
  Administered 2016-12-01: 5 mg/h via INTRAVENOUS
  Administered 2016-12-02: 12.5 mg/h via INTRAVENOUS
  Administered 2016-12-02: 15 mg/h via INTRAVENOUS
  Administered 2016-12-02: 5 mg/h via INTRAVENOUS
  Administered 2016-12-02: 10 mg/h via INTRAVENOUS
  Administered 2016-12-02: 15 mg/h via INTRAVENOUS
  Administered 2016-12-02: 10 mg/h via INTRAVENOUS
  Administered 2016-12-02: 12.5 mg/h via INTRAVENOUS
  Administered 2016-12-02 (×3): 15 mg/h via INTRAVENOUS
  Administered 2016-12-02: 7 mg/h via INTRAVENOUS
  Administered 2016-12-03: 13 mg/h via INTRAVENOUS
  Administered 2016-12-03 (×2): 7.5 mg/h via INTRAVENOUS
  Administered 2016-12-03 (×2): 5 mg/h via INTRAVENOUS
  Administered 2016-12-03: 15 mg/h via INTRAVENOUS
  Administered 2016-12-03: 7.5 mg/h via INTRAVENOUS
  Administered 2016-12-03: 15 mg/h via INTRAVENOUS
  Administered 2016-12-03: 10 mg/h via INTRAVENOUS
  Administered 2016-12-04: 15 mg/h via INTRAVENOUS
  Administered 2016-12-04: 10 mg/h via INTRAVENOUS
  Administered 2016-12-04: 12 mg/h via INTRAVENOUS
  Administered 2016-12-04: 12.5 mg/h via INTRAVENOUS
  Filled 2016-12-01 (×6): qty 200
  Filled 2016-12-01: qty 400
  Filled 2016-12-01 (×7): qty 200
  Filled 2016-12-01: qty 400
  Filled 2016-12-01 (×7): qty 200
  Filled 2016-12-01: qty 400
  Filled 2016-12-01: qty 200

## 2016-12-01 MED ORDER — DOCUSATE SODIUM 100 MG PO CAPS: 100.0000 mg | ORAL_CAPSULE | Freq: Two times a day (BID) | ORAL | Status: DC

## 2016-12-01 NOTE — ED Provider Notes (Signed)
[facility]-EMERGENCY DEPT Provider Note   CSN: [id number] Arrival date & time: 12/01/16  1731     History   Chief Complaint Chief Complaint  Patient presents with  . Headache    HPI Bryan [name] is a 36 y.o. male.  HPI   Pt hx hx morbid obesity p/w frontal headache and hypertension, sent from [facility] for [facility] head.  Family reports patient was fine this morning, took his mother to work around 6:15am, then went back to bed.  He later woke up around 11:00am and then started having a headache.  Headache is frontal, constant. He complained to his step-father he wasn't feeling well and was walking down the hall when he passed out and fell.   Pt denies fevers, recent illness, CP, SOB, lightheadedness/dizziness, leg swelling, leg pain.     Pt was given labetolol, fentanyl, zofran at prior ED.  Transferred for CT due to weight restrictions of machine.       Past Medical History:  Diagnosis Date  . Complication of anesthesia    mom states they almost lost him but not sure if he was given to much medication  . Varicose veins    mutilple bleeding episodes, bilateral lower extremities)    There are no active problems to display for this patient.   Past Surgical History:  Procedure Laterality Date  . lipo around heart  at age 36   pt states its bc they could hear his  heart beat  . ORIF ANKLE FRACTURE Left 09/02/2012   Procedure: OPEN REDUCTION INTERNAL FIXATION (ORIF) ANKLE FRACTURE;  Surgeon: Valeria BatmanPeter W Whitfield, MD;  Location: [facility] OR;  Service: Orthopedics;  Laterality: Left;  OPEN REDUCTION INTERNAL FIXATION LEFT DISTAL FIBULA FRACTURE WITH REDUCTION FIXATION DISTAL TIBIA/FIBULAR DIASTASIS        Home Medications    Prior to Admission medications   Medication Sig Start Date End Date Taking? Authorizing Provider  oxyCODONE-acetaminophen (PERCOCET/ROXICET) 5-325 MG per tablet Take 1-2 tablets by mouth every 4 (four) hours as needed. For pain    [provider]   oxyCODONE-acetaminophen (PERCOCET/ROXICET) 5-325 MG per tablet Take 1-2 tablets by mouth every 4 (four) hours as needed. Patient not taking: Reported on 01/12/2015 09/02/12   Jetty PeeksPetrarca, Brian D, PA-C    Family History No family history on file.  Social History Social History  Substance Use Topics  . Smoking status: Never Smoker  . Smokeless tobacco: Current User    Types: Chew  . Alcohol use 0.0 oz/week     Comment: couple of times a week     Allergies   Penicillins   Review of Systems Review of Systems  All other systems reviewed and are negative.    Physical Exam Updated Vital Signs BP (!) 171/110 (BP Location: Right Arm)   Pulse 71   Temp 98.3 F (36.8 C) (Oral)   Resp 13   SpO2 97%   Physical Exam  Constitutional: He is oriented to person, place, and time. He appears well-developed and well-nourished. No distress.  Morbidly obese.  Resting with eyes closed.  Wakes up with verbal stimulation only.    HENT:  Head: Normocephalic and atraumatic.  Neck: Neck supple.  Cardiovascular: Normal rate and regular rhythm.   Pulmonary/Chest: Effort normal and breath sounds normal. No respiratory distress. He has no wheezes. He has no rales.  Abdominal: Soft. He exhibits no distension and no mass. There is no tenderness. There is no rebound and no guarding.  Musculoskeletal:  Large lower extremities are symmetric.  No tenderness of calves.    Neurological: He is alert and oriented to person, place, and time. No cranial nerve deficit. He exhibits normal muscle tone.  Lifts all extremities off stretcher without difficulty, sensation and strength is symmetric.    Skin: He is not diaphoretic.  Nursing note and vitals reviewed.    ED Treatments / Results  Labs (all labs ordered are listed, but only abnormal results are displayed) Labs Reviewed - No data to display  EKG  EKG Interpretation None       Radiology No results found.  Procedures Procedures (including  critical care time)  Medications Ordered in ED Medications  labetalol (NORMODYNE,TRANDATE) injection 20 mg (not administered)  iopamidol (ISOVUE-370) 76 % injection (not administered)  iopamidol (ISOVUE-370) 76 % injection 100 mL (100 mLs Intravenous Contrast Given 12/01/16 1950)     Initial Impression / Assessment and Plan / ED Course  I have reviewed the triage vital signs and the nursing notes.  Pertinent labs & imaging results that were available during my care of the patient were reviewed by me and considered in my medical decision making (see chart for details).  Clinical Course as of Dec 02 2003  Mon Dec 01, 2016  1810 CBC, BMP performed 1326hrs at prior hospital CBC WBC 6.6, HGB 11.6, Plt 280 NA 137, K 3.7, Chl 100, CO2 25.5, Glu 130, BUN 14, Creat 0.89, Ca 8.5   [EW]  1927 171/110 currently.  Pt sleeping.   [EW]    Clinical Course User Index [EW] Trixie Dredge, [place]    Patient with hx morbid obesity sent from outside hospital for headache, syncope, hypertension.  CT and CT angio pending at change of shift.  Full labs drawn earlier this afternoon, partially written in ED course- available in patient's folder in his room.  Pt also discussed with and seen by Dr Clayborne Dana.  Labetolol ordered but not yet given as pt at CT.  Signed out to Sabino Dick, NP, at change of shift pending CT and further treatment, reassessment.    Final Clinical Impressions(s) / ED Diagnoses   Final diagnoses:  Hypertension, unspecified type  Bad headache  Syncope, unspecified syncope type    New Prescriptions New Prescriptions   No medications on file     Trixie Dredge, Cordelia Poche 12/01/16 2005    Jacqulyne Gladue, Cordelia Poche 12/01/16 2007    Mesner, Barbara Cower, MD 12/02/16 [phone]

## 2016-12-01 NOTE — ED Triage Notes (Signed)
Pt presents via [facility] from [facility] for CT scan of head. States pt needed transfer due to not fitting in scanner. Pt reports complaint of headache and HTN x 1 day.

## 2016-12-01 NOTE — H&P (Signed)
Bryan KaysDarren D [name] is an 36 y.o. male.   Chief Complaint: Headache HPI: 36 year old male with sudden onset of headache earlier today around 10 AM. No precipitating trauma or other event. Patient reports the sudden onset headache. No prior history of headache. No history of seizure. No history of numbness, paresthesias or weakness. No known family history of aneurysm.  Past Medical History:  Diagnosis Date  . Complication of anesthesia    mom states they almost lost him but not sure if he was given to much medication  . Varicose veins    mutilple bleeding episodes, bilateral lower extremities)    Past Surgical History:  Procedure Laterality Date  . lipo around heart  at age 36   pt states its bc they could hear his  heart beat  . ORIF ANKLE FRACTURE Left 09/02/2012   Procedure: OPEN REDUCTION INTERNAL FIXATION (ORIF) ANKLE FRACTURE;  Surgeon: Valeria BatmanPeter W Whitfield, MD;  Location: [facility] OR;  Service: Orthopedics;  Laterality: Left;  OPEN REDUCTION INTERNAL FIXATION LEFT DISTAL FIBULA FRACTURE WITH REDUCTION FIXATION DISTAL TIBIA/FIBULAR DIASTASIS     No family history on file. Social History:  reports that he has never smoked. His smokeless tobacco use includes Chew. He reports that he drinks alcohol. He reports that he does not use drugs.  Allergies:  Allergies  Allergen Reactions  . Penicillins Swelling     (Not in a hospital admission)  No results found for this or any previous visit (from the past 48 hour(s)). Ct Angio Head W Or Wo Contrast  Result Date: 12/01/2016 CLINICAL DATA:  Hypertension, syncope and headache. EXAM: CT ANGIOGRAPHY HEAD TECHNIQUE: Multidetector CT imaging of the head was performed using the standard protocol during bolus administration of intravenous contrast. Multiplanar CT image reconstructions and MIPs were obtained to evaluate the vascular anatomy. CONTRAST:  100 mL Isovue 370 COMPARISON:  None. FINDINGS: CT HEAD Brain: There is diffuse subarachnoid hemorrhage over  both convexities and within the sylvian fissures. Additionally, there is blood within the interpeduncular cistern and both quadrigeminal plate cisterns, as well as within the lateral ventricles. The basal cisterns are largely effaced. There is no cerebellar tonsillar herniation. No midline shift. Skull: Normal visualized skull base, calvarium and extracranial soft tissues. Sinuses/Orbits: No sinus fluid levels or advanced mucosal thickening. No mastoid effusion. Normal orbits. CTA HEAD FINDINGS Contrast bolus timing is suboptimal because the aortic arch could not be used for bolus tracking because of patient body habitus. Anterior circulation: There is an inferiorly and laterally directed outpouching from the communicating segment of the left internal carotid artery, at the origin of the left p-comm, that measures 3 mm at its neck and 5 mm base to apex. This is best seen on series 22, image 91 and series 23 images 116 and 117. The anterior circulation is patent without high-grade stenosis. Posterior circulation: There is poor opacification of the basilar artery, which may be congenital variation due to the presence of bilateral posterior communicating arteries. This also could be an artifact bolus timing. The posterior circulation is otherwise normal. Venous sinuses: As permitted by contrast timing, patent. Anatomic variants: None Delayed phase: No parenchymal contrast enhancement. Review of the MIP images confirms the above findings IMPRESSION: 1. Diffuse subarachnoid hemorrhage over both convexities and extending into the basal cisterns, which are markedly narrowed. Critical Value/emergent results were called by telephone at the time of interpretation on 12/01/2016 at 8:05 pm to Dr. Erin HearingMESSNER, who verbally acknowledged these results. 2. CTA images are degraded by poor bolus  timing, as patient body habitus prevented use of the aortic arch for bolus tracking. 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 Critical Value/emergent results were called by telephone at the time of interpretation on 12/01/2016 at 8:47 pm to Manus Rudd, NP who verbally acknowledged these results. 3. 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 Electronically Signed   By: Deatra Robinson M.D.   On: 12/01/2016 20:49   Ct Cervical Spine Wo Contrast  Result Date: 12/01/2016 CLINICAL DATA:  Hypertension and syncope EXAM: CT CERVICAL SPINE WITHOUT CONTRAST TECHNIQUE: Multidetector CT imaging of the cervical spine was performed without intravenous contrast. Multiplanar CT image reconstructions were also generated. COMPARISON:  None. FINDINGS: Assessment of the cervical spine is severely degraded by beam attenuation secondary to patient body habitus. Within that limitation, the alignment of the cervical spine is normal. There is no visible fracture. There is an azygos lobe on the right, that extends above the thoracic inlet. IMPRESSION: 1. Markedly degraded examination of the cervical spine due to beam attenuation. Within that limitation, no acute fracture or static subluxation is visualized. 2. 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 This is likely normal anatomic variation. Correlation with chest radiographs could be considered. Electronically Signed   By: Deatra Robinson M.D.   On: 12/01/2016 20:31    Pertinent items noted in HPI and remainder of comprehensive ROS otherwise negative.  Blood pressure (!) 171/79, pulse 70, temperature 98.3 F (36.8 C), temperature source Oral, resp. rate 19, SpO2 98 %.  Patient is morbidly obese. He is minimally somnolent but will awaken easily. He is oriented 4. Speech is  fluent. Judgment and insight appear intact. Cranial nerve function finds his pupils to be 3 mm and reactive bilaterally. Gaze is conjugate. Extraocular movements are full. Facial movement and sensation normal bilaterally. Tongue protrudes in midline. Palate elevates to midline. Motor examination of his extremities reveal 5/5 strength in both upper and lower extremities. No pronator drift. No sensory abnormalities. Reflexes hypoactive. Examination head ears eyes nose and throat is unremarkable. Chest and abdomen are benign. Extremities are free from injury or deformity. Neck minimally stiff. Assessment/Plan Grade 2 subarachnoid hemorrhage. 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 Plan admit to ICU for observation and hypertension control. Plan for arteriogram in morning for considerations of endovascular versus open treatment. Patient will remain nothing by mouth.  Cynthia Cogle A 12/01/2016, 9:24 PM

## 2016-12-01 NOTE — ED Provider Notes (Signed)
Medical screening examination/treatment/procedure(s) were conducted as a shared visit with non-physician practitioner(s) and myself.  I personally evaluated the patient during the encounter.  [age] year old male with acute onset of worsening headache of his life earlier today associated with syncope and hypertension. [facility] in [place] but could not fit on their CT scanner so sent here for further evaluation. On my evaluation patient is sleepy and has some disconjugate gaze but corrects itself easily. His blood pressure still 170 systolic over 110 diastolic. He is moving all extremities and is on his way to CT scan this point. The highest level concern is for subarachnoid hemorrhage versus other type of intracranial bleed. We'll give a dose of labetalol pending CT results. CT with diffuse subarachnoid hemorrhage. Has not received the labetalol yet, we'll start clevidipine as neurology usually prefers this. Neurosurgery consult put in.  Dr. Jordan LikesPool evaluated and will admit.    CRITICAL CARE Performed by: Marily MemosMesner, Ramiz Turpin Total critical care time: 35 minutes Critical care time was exclusive of separately billable procedures and treating other patients. Critical care was necessary to treat or prevent imminent or life-threatening deterioration. Critical care was time spent personally by me on the following activities: development of treatment plan with patient and/or surrogate as well as nursing, discussions with consultants, evaluation of patient's response to treatment, examination of patient, obtaining history from patient or surrogate, ordering and performing treatments and interventions, ordering and review of laboratory studies, ordering and review of radiographic studies, pulse oximetry and re-evaluation of patient's condition.    Juanito Gonyer, Barbara CowerJason, MD 12/02/16 [phone]

## 2016-12-01 NOTE — ED Notes (Signed)
Patient transported to CT 

## 2016-12-01 NOTE — ED Notes (Signed)
Attempted to place a second PIV x2. Unsuccessful due to rolling veins and adipose tissue.

## 2016-12-02 ENCOUNTER — Encounter (HOSPITAL_COMMUNITY)
Admission: EM | Disposition: A | Discharge status: Rehabilitation Facility | Payer: Self-pay | Source: Home / Self Care | Attending: Neurosurgery

## 2016-12-02 ENCOUNTER — Inpatient Hospital Stay (HOSPITAL_COMMUNITY): Payer: BLUE CROSS/BLUE SHIELD

## 2016-12-02 ENCOUNTER — Inpatient Hospital Stay (HOSPITAL_COMMUNITY): Payer: BLUE CROSS/BLUE SHIELD | Admitting: Certified Registered Nurse Anesthetist

## 2016-12-02 ENCOUNTER — Inpatient Hospital Stay (HOSPITAL_COMMUNITY)
Admission: EM | Disposition: A | Discharge status: Rehabilitation Facility | Payer: Self-pay | Source: Home / Self Care | Attending: Neurosurgery

## 2016-12-02 DIAGNOSIS — I10 Essential (primary) hypertension: Secondary | ICD-10-CM

## 2016-12-02 DIAGNOSIS — Z978 Presence of other specified devices: Secondary | ICD-10-CM

## 2016-12-02 DIAGNOSIS — J9691 Respiratory failure, unspecified with hypoxia: Secondary | ICD-10-CM

## 2016-12-02 HISTORY — PX: IR US GUIDE VASC ACCESS RIGHT: IMG2390

## 2016-12-02 HISTORY — PX: RADIOLOGY WITH ANESTHESIA: SHX6223

## 2016-12-02 HISTORY — PX: IR FLUORO RM 30-60 MIN: IMG2384

## 2016-12-02 HISTORY — PX: CRANIOTOMY: SHX93

## 2016-12-02 LAB — PROTIME-INR
INR: 1.02
PROTHROMBIN TIME: 13.4 s (ref 11.4–15.2)

## 2016-12-02 LAB — POCT I-STAT 7, (LYTES, BLD GAS, ICA,H+H)
ACID-BASE DEFICIT: 1 mmol/L (ref 0.0–2.0)
Bicarbonate: 25.7 mmol/L (ref 20.0–28.0)
CALCIUM ION: 1.13 mmol/L — AB (ref 1.15–1.40)
HCT: 30 % — ABNORMAL LOW (ref 39.0–52.0)
HEMOGLOBIN: 10.2 g/dL — AB (ref 13.0–17.0)
O2 Saturation: 99 %
PH ART: 7.344 — AB (ref 7.350–7.450)
Potassium: 3.8 mmol/L (ref 3.5–5.1)
SODIUM: 137 mmol/L (ref 135–145)
TCO2: 27 mmol/L (ref 0–100)
pCO2 arterial: 46.6 mmHg (ref 32.0–48.0)
pO2, Arterial: 175 mmHg — ABNORMAL HIGH (ref 83.0–108.0)

## 2016-12-02 LAB — POCT I-STAT 3, VENOUS BLOOD GAS (G3P V)
ACID-BASE DEFICIT: 3 mmol/L — AB (ref 0.0–2.0)
BICARBONATE: 21.7 mmol/L (ref 20.0–28.0)
O2 SAT: 75 %
PCO2 VEN: 34.2 mmHg — AB (ref 44.0–60.0)
PH VEN: 7.408 (ref 7.250–7.430)
PO2 VEN: 38 mmHg (ref 32.0–45.0)
Patient temperature: 97.9
TCO2: 23 mmol/L (ref 0–100)

## 2016-12-02 LAB — PREPARE RBC (CROSSMATCH)

## 2016-12-02 LAB — RAPID URINE DRUG SCREEN, HOSP PERFORMED
Amphetamines: NOT DETECTED
Barbiturates: NOT DETECTED
Benzodiazepines: POSITIVE — AB
Cocaine: NOT DETECTED
Opiates: NOT DETECTED
Tetrahydrocannabinol: NOT DETECTED

## 2016-12-02 LAB — TRIGLYCERIDES: Triglycerides: 117 mg/dL (ref <150)

## 2016-12-02 LAB — CBC
HEMATOCRIT: 39.4 % (ref 39.0–52.0)
HEMOGLOBIN: 12.3 g/dL — AB (ref 13.0–17.0)
MCH: 26.3 pg (ref 26.0–34.0)
MCHC: 31.2 g/dL (ref 30.0–36.0)
MCV: 84.2 fL (ref 78.0–100.0)
Platelets: 280 10*3/uL (ref 150–400)
RBC: 4.68 MIL/uL (ref 4.22–5.81)
RDW: 13.7 % (ref 11.5–15.5)
WBC: 7.9 10*3/uL (ref 4.0–10.5)

## 2016-12-02 LAB — POCT I-STAT 3, ART BLOOD GAS (G3+)
Acid-base deficit: 5 mmol/L — ABNORMAL HIGH (ref 0.0–2.0)
Bicarbonate: 24.1 mmol/L (ref 20.0–28.0)
O2 Saturation: 86 %
PCO2 ART: 61 mmHg — AB (ref 32.0–48.0)
PH ART: 7.206 — AB (ref 7.350–7.450)
Patient temperature: 98.9
TCO2: 26 mmol/L (ref 0–100)
pO2, Arterial: 65 mmHg — ABNORMAL LOW (ref 83.0–108.0)

## 2016-12-02 LAB — ABO/RH: ABO/RH(D): O POS

## 2016-12-02 LAB — GLUCOSE, CAPILLARY
GLUCOSE-CAPILLARY: 127 mg/dL — AB (ref 65–99)
GLUCOSE-CAPILLARY: 132 mg/dL — AB (ref 65–99)

## 2016-12-02 LAB — HIV ANTIBODY (ROUTINE TESTING W REFLEX): HIV SCREEN 4TH GENERATION: NONREACTIVE

## 2016-12-02 LAB — APTT: aPTT: 29 s (ref 24–36)

## 2016-12-02 SURGERY — CRANIOTOMY INTRACRANIAL ANEURYSM FOR CAROTID
Anesthesia: General | Site: Head | Laterality: Left

## 2016-12-02 SURGERY — Surgical Case
Anesthesia: *Unknown

## 2016-12-02 SURGERY — RADIOLOGY WITH ANESTHESIA
Anesthesia: Monitor Anesthesia Care

## 2016-12-02 MED ORDER — FENTANYL CITRATE (PF) 100 MCG/2ML IJ SOLN
100.0000 ug | INTRAMUSCULAR | Status: DC | PRN
  Administered 2016-12-02: 100 ug via INTRAVENOUS

## 2016-12-02 MED ORDER — CHLORHEXIDINE GLUCONATE 0.12% ORAL RINSE (MEDLINE KIT)
15.0000 mL | Freq: Two times a day (BID) | OROMUCOSAL | Status: DC
  Administered 2016-12-02 – 2016-12-15 (×28): 15 mL via OROMUCOSAL

## 2016-12-02 MED ORDER — BACITRACIN ZINC 500 UNIT/GM EX OINT
TOPICAL_OINTMENT | CUTANEOUS | Status: AC
  Filled 2016-12-02: qty 28.35

## 2016-12-02 MED ORDER — INDOCYANINE GREEN 25 MG IV SOLR
25.0000 mg | Freq: Once | INTRAVENOUS | Status: DC
  Filled 2016-12-02: qty 25

## 2016-12-02 MED ORDER — FENTANYL CITRATE (PF) 250 MCG/5ML IJ SOLN
INTRAMUSCULAR | Status: DC | PRN
  Administered 2016-12-02: 100 ug via INTRAVENOUS
  Administered 2016-12-02: 150 ug via INTRAVENOUS
  Administered 2016-12-02 (×2): 100 ug via INTRAVENOUS
  Administered 2016-12-02 (×2): 50 ug via INTRAVENOUS
  Administered 2016-12-02: 100 ug via INTRAVENOUS
  Administered 2016-12-02: 50 ug via INTRAVENOUS
  Administered 2016-12-02: 100 ug via INTRAVENOUS

## 2016-12-02 MED ORDER — BACITRACIN ZINC 500 UNIT/GM EX OINT
TOPICAL_OINTMENT | CUTANEOUS | Status: DC | PRN
  Administered 2016-12-02 (×2): 1 via TOPICAL

## 2016-12-02 MED ORDER — LIDOCAINE 2% (20 MG/ML) 5 ML SYRINGE
INTRAMUSCULAR | Status: AC
  Filled 2016-12-02: qty 5

## 2016-12-02 MED ORDER — MIDAZOLAM HCL 2 MG/2ML IJ SOLN
2.0000 mg | Freq: Once | INTRAMUSCULAR | Status: AC
  Administered 2016-12-02: 2 mg via INTRAVENOUS

## 2016-12-02 MED ORDER — HEMOSTATIC AGENTS (NO CHARGE) OPTIME
TOPICAL | Status: DC | PRN
  Administered 2016-12-02: 1 via TOPICAL

## 2016-12-02 MED ORDER — FENTANYL CITRATE (PF) 250 MCG/5ML IJ SOLN
INTRAMUSCULAR | Status: AC
  Filled 2016-12-02: qty 5

## 2016-12-02 MED ORDER — THROMBIN 5000 UNITS EX SOLR
CUTANEOUS | Status: AC
  Filled 2016-12-02: qty 15000

## 2016-12-02 MED ORDER — DEXAMETHASONE SODIUM PHOSPHATE 10 MG/ML IJ SOLN
INTRAMUSCULAR | Status: DC | PRN
  Administered 2016-12-02: 10 mg via INTRAVENOUS

## 2016-12-02 MED ORDER — BUPIVACAINE HCL 0.5 % IJ SOLN
INTRAMUSCULAR | Status: DC | PRN
  Administered 2016-12-02: 5 mL

## 2016-12-02 MED ORDER — ROCURONIUM BROMIDE 10 MG/ML (PF) SYRINGE
PREFILLED_SYRINGE | INTRAVENOUS | Status: AC
  Filled 2016-12-02: qty 15

## 2016-12-02 MED ORDER — 0.9 % SODIUM CHLORIDE (POUR BTL) OPTIME
TOPICAL | Status: DC | PRN
  Administered 2016-12-02 (×3): 1000 mL

## 2016-12-02 MED ORDER — VANCOMYCIN HCL IN DEXTROSE 1-5 GM/200ML-% IV SOLN
INTRAVENOUS | Status: AC
  Filled 2016-12-02: qty 200

## 2016-12-02 MED ORDER — THROMBIN 20000 UNITS EX SOLR
CUTANEOUS | Status: DC | PRN
  Administered 2016-12-02: 17:00:00 via TOPICAL

## 2016-12-02 MED ORDER — PHENYLEPHRINE 40 MCG/ML (10ML) SYRINGE FOR IV PUSH (FOR BLOOD PRESSURE SUPPORT)
PREFILLED_SYRINGE | INTRAVENOUS | Status: AC
  Filled 2016-12-02: qty 10

## 2016-12-02 MED ORDER — SODIUM CHLORIDE 0.9 % IV SOLN
INTRAVENOUS | Status: DC | PRN
  Administered 2016-12-02: 1000 mg via INTRAVENOUS

## 2016-12-02 MED ORDER — FENTANYL CITRATE (PF) 100 MCG/2ML IJ SOLN
INTRAMUSCULAR | Status: AC
  Filled 2016-12-02: qty 2

## 2016-12-02 MED ORDER — ROCURONIUM BROMIDE 100 MG/10ML IV SOLN
INTRAVENOUS | Status: DC | PRN
  Administered 2016-12-02: 50 mg via INTRAVENOUS
  Administered 2016-12-02: 20 mg via INTRAVENOUS
  Administered 2016-12-02 (×2): 50 mg via INTRAVENOUS
  Administered 2016-12-02: 60 mg via INTRAVENOUS

## 2016-12-02 MED ORDER — ROCURONIUM BROMIDE 50 MG/5ML IV SOLN
50.0000 mg | Freq: Once | INTRAVENOUS | Status: AC
  Administered 2016-12-02: 50 mg via INTRAVENOUS

## 2016-12-02 MED ORDER — PROPOFOL 1000 MG/100ML IV EMUL
INTRAVENOUS | Status: AC
  Administered 2016-12-02: 10:00:00
  Filled 2016-12-02: qty 100

## 2016-12-02 MED ORDER — PROPOFOL 10 MG/ML IV BOLUS
INTRAVENOUS | Status: DC | PRN
  Administered 2016-12-02: 80 mg via INTRAVENOUS

## 2016-12-02 MED ORDER — BUPIVACAINE HCL (PF) 0.5 % IJ SOLN
INTRAMUSCULAR | Status: AC
  Filled 2016-12-02: qty 30

## 2016-12-02 MED ORDER — LIDOCAINE HCL (PF) 1 % IJ SOLN
INTRAMUSCULAR | Status: AC
  Filled 2016-12-02: qty 30

## 2016-12-02 MED ORDER — DEXAMETHASONE SODIUM PHOSPHATE 10 MG/ML IJ SOLN
INTRAMUSCULAR | Status: AC
  Filled 2016-12-02: qty 1

## 2016-12-02 MED ORDER — MIDAZOLAM HCL 2 MG/2ML IJ SOLN
INTRAMUSCULAR | Status: AC
  Filled 2016-12-02: qty 2

## 2016-12-02 MED ORDER — VANCOMYCIN HCL 1000 MG IV SOLR
INTRAVENOUS | Status: DC | PRN
  Administered 2016-12-02: 1000 mg via INTRAVENOUS

## 2016-12-02 MED ORDER — PHENYLEPHRINE HCL 10 MG/ML IJ SOLN
INTRAVENOUS | Status: DC | PRN
  Administered 2016-12-02: 25 ug/min via INTRAVENOUS

## 2016-12-02 MED ORDER — THROMBIN 20000 UNITS EX SOLR
CUTANEOUS | Status: AC
  Filled 2016-12-02: qty 20000

## 2016-12-02 MED ORDER — SODIUM CHLORIDE 0.9 % IR SOLN
Status: DC | PRN
  Administered 2016-12-02: 17:00:00

## 2016-12-02 MED ORDER — FENTANYL 2500MCG IN NS 250ML (10MCG/ML) PREMIX INFUSION
25.0000 ug/h | INTRAVENOUS | Status: DC
  Administered 2016-12-02: 300 ug/h via INTRAVENOUS
  Administered 2016-12-03: 400 ug/h via INTRAVENOUS
  Administered 2016-12-03: 125 ug/h via INTRAVENOUS
  Administered 2016-12-04: 250 ug/h via INTRAVENOUS
  Administered 2016-12-04 (×3): 400 ug/h via INTRAVENOUS
  Administered 2016-12-05: 300 ug/h via INTRAVENOUS
  Administered 2016-12-05 (×2): 400 ug/h via INTRAVENOUS
  Administered 2016-12-05: 300 ug/h via INTRAVENOUS
  Administered 2016-12-06 – 2016-12-07 (×5): 400 ug/h via INTRAVENOUS
  Administered 2016-12-07: 350 ug/h via INTRAVENOUS
  Administered 2016-12-08: 400 ug/h via INTRAVENOUS
  Administered 2016-12-08 (×2): 300 ug/h via INTRAVENOUS
  Administered 2016-12-09 – 2016-12-10 (×3): 400 ug/h via INTRAVENOUS
  Administered 2016-12-13: 50 ug/h via INTRAVENOUS
  Administered 2016-12-13: 100 ug/h via INTRAVENOUS
  Administered 2016-12-14: 125 ug/h via INTRAVENOUS
  Administered 2016-12-15: 100 ug/h via INTRAVENOUS
  Filled 2016-12-02 (×33): qty 250

## 2016-12-02 MED ORDER — FENTANYL CITRATE (PF) 100 MCG/2ML IJ SOLN
100.0000 ug | Freq: Once | INTRAMUSCULAR | Status: AC
  Administered 2016-12-02: 100 ug via INTRAVENOUS

## 2016-12-02 MED ORDER — THROMBIN 5000 UNITS EX SOLR
OROMUCOSAL | Status: DC | PRN
  Administered 2016-12-02 (×2): via TOPICAL

## 2016-12-02 MED ORDER — ORAL CARE MOUTH RINSE
15.0000 mL | OROMUCOSAL | Status: DC
  Administered 2016-12-02 – 2016-12-15 (×130): 15 mL via OROMUCOSAL

## 2016-12-02 MED ORDER — PROPOFOL 10 MG/ML IV BOLUS
INTRAVENOUS | Status: AC
  Filled 2016-12-02: qty 20

## 2016-12-02 MED ORDER — PROPOFOL 1000 MG/100ML IV EMUL
5.0000 ug/kg/min | INTRAVENOUS | Status: DC
  Administered 2016-12-02: 50 ug/kg/min via INTRAVENOUS
  Administered 2016-12-02: 20 ug/kg/min via INTRAVENOUS
  Administered 2016-12-02: 35 ug/kg/min via INTRAVENOUS
  Administered 2016-12-02: 40 ug/kg/min via INTRAVENOUS
  Administered 2016-12-03: 35 ug/kg/min via INTRAVENOUS
  Administered 2016-12-03: 40 ug/kg/min via INTRAVENOUS
  Administered 2016-12-03: 30 ug/kg/min via INTRAVENOUS
  Filled 2016-12-02 (×8): qty 100

## 2016-12-02 MED ORDER — LIDOCAINE HCL (PF) 1 % IJ SOLN
INTRAMUSCULAR | Status: DC | PRN
  Administered 2016-12-02: 5 mL

## 2016-12-02 MED ORDER — ORAL CARE MOUTH RINSE
15.0000 mL | Freq: Two times a day (BID) | OROMUCOSAL | Status: DC
  Administered 2016-12-02: 15 mL via OROMUCOSAL

## 2016-12-02 MED ORDER — IOPAMIDOL (ISOVUE-300) INJECTION 61%
INTRAVENOUS | Status: AC
  Filled 2016-12-02: qty 150

## 2016-12-02 MED ORDER — MANNITOL 20 % IV SOLN
50.0000 g | INTRAVENOUS | Status: AC
  Administered 2016-12-02: 250 mL via INTRAVENOUS
  Filled 2016-12-02: qty 250

## 2016-12-02 MED ORDER — FENTANYL CITRATE (PF) 100 MCG/2ML IJ SOLN
100.0000 ug | INTRAMUSCULAR | Status: DC | PRN
  Administered 2016-12-11 – 2016-12-16 (×4): 100 ug via INTRAVENOUS
  Filled 2016-12-02 (×7): qty 2

## 2016-12-02 MED ORDER — SODIUM CHLORIDE 0.9 % IV SOLN
1000.0000 mg | INTRAVENOUS | Status: AC
  Filled 2016-12-02: qty 10

## 2016-12-02 MED ORDER — ALBUTEROL SULFATE (2.5 MG/3ML) 0.083% IN NEBU
2.5000 mg | INHALATION_SOLUTION | RESPIRATORY_TRACT | Status: DC
  Administered 2016-12-02 – 2016-12-06 (×26): 2.5 mg via RESPIRATORY_TRACT
  Filled 2016-12-02 (×28): qty 3

## 2016-12-02 MED ORDER — ETOMIDATE 2 MG/ML IV SOLN
20.0000 mg | Freq: Once | INTRAVENOUS | Status: AC
  Administered 2016-12-02: 20 mg via INTRAVENOUS

## 2016-12-02 MED ORDER — LABETALOL HCL 5 MG/ML IV SOLN
INTRAVENOUS | Status: DC | PRN
  Administered 2016-12-02 (×2): 10 mg via INTRAVENOUS
  Administered 2016-12-02 (×2): 5 mg via INTRAVENOUS

## 2016-12-02 MED ORDER — FUROSEMIDE 10 MG/ML IJ SOLN
20.0000 mg | Freq: Once | INTRAMUSCULAR | Status: DC
  Filled 2016-12-02: qty 2

## 2016-12-02 MED ORDER — FUROSEMIDE 10 MG/ML IJ SOLN
20.0000 mg | Freq: Once | INTRAMUSCULAR | Status: AC
  Administered 2016-12-02: 20 mg via INTRAVENOUS
  Filled 2016-12-02: qty 2

## 2016-12-02 MED ORDER — LABETALOL HCL 5 MG/ML IV SOLN
INTRAVENOUS | Status: AC
Start: 2016-12-02 — End: 2016-12-02
  Filled 2016-12-02: qty 8

## 2016-12-02 MED ORDER — SODIUM CHLORIDE 0.9 % IV SOLN: 10.0000 mL/h | Freq: Once | INTRAVENOUS | Status: DC

## 2016-12-02 SURGICAL SUPPLY — 102 items
APL SKNCLS STERI-STRIP NONHPOA (GAUZE/BANDAGES/DRESSINGS) ×1
BAG DECANTER FOR FLEXI CONT (MISCELLANEOUS) ×3 IMPLANT
BANDAGE GAUZE 4  KLING STR (GAUZE/BANDAGES/DRESSINGS) ×2 IMPLANT
BATTERY IQ STERILE (MISCELLANEOUS) ×2 IMPLANT
BENZOIN TINCTURE PRP APPL 2/3 (GAUZE/BANDAGES/DRESSINGS) ×2 IMPLANT
BIT DRILL WIRE PASS 1.3MM (BIT) IMPLANT
BLADE SAW GIGLI 16 STRL (MISCELLANEOUS) IMPLANT
BLADE SURG 15 STRL LF DISP TIS (BLADE) ×1 IMPLANT
BLADE SURG 15 STRL SS (BLADE)
BLADE ULTRA TIP 2M (BLADE) IMPLANT
BNDG GAUZE ELAST 4 BULKY (GAUZE/BANDAGES/DRESSINGS) ×4 IMPLANT
BTRY SRG DRVR 1.5 IQ (MISCELLANEOUS) ×1
BUR ACORN 6.0 PRECISION (BURR) ×1 IMPLANT
BUR ACORN 6.0MM PRECISION (BURR) ×1
BUR MATCHSTICK NEURO 3.0 LAGG (BURR) IMPLANT
BUR ROUND FLUTED 4 SOFT TCH (BURR) ×1 IMPLANT
BUR ROUND FLUTED 4MM SOFT TCH (BURR) ×1
BUR SPIRAL ROUTER 2.3 (BUR) IMPLANT
BUR SPIRAL ROUTER 2.3MM (BUR)
BUR TAPERED ROUTER 3.0 (BURR) ×2 IMPLANT
CANISTER SUCT 3000ML PPV (MISCELLANEOUS) ×7 IMPLANT
CARTRIDGE OIL MAESTRO DRILL (MISCELLANEOUS) ×1 IMPLANT
CLIP ANEURY TI PERM MINI 6.6M (Clip) ×2 IMPLANT
CLIP TI MEDIUM 6 (CLIP) IMPLANT
COVER MAYO STAND STRL (DRAPES) IMPLANT
DECANTER SPIKE VIAL GLASS SM (MISCELLANEOUS) ×3 IMPLANT
DIFFUSER DRILL AIR PNEUMATIC (MISCELLANEOUS) ×3 IMPLANT
DRAPE MICROSCOPE LEICA (MISCELLANEOUS) ×3 IMPLANT
DRAPE NEUROLOGICAL W/INCISE (DRAPES) ×3 IMPLANT
DRAPE WARM FLUID 44X44 (DRAPE) ×3 IMPLANT
DRILL WIRE PASS 1.3MM (BIT)
DRSG ADAPTIC 3X8 NADH LF (GAUZE/BANDAGES/DRESSINGS) ×3 IMPLANT
DRSG TELFA 3X8 NADH (GAUZE/BANDAGES/DRESSINGS) ×3 IMPLANT
DURAMATRIX ONLAY 3X3 (Plate) ×2 IMPLANT
DURAPREP 6ML APPLICATOR 50/CS (WOUND CARE) ×3 IMPLANT
ELECT REM PT RETURN 9FT ADLT (ELECTROSURGICAL) ×3
ELECTRODE REM PT RTRN 9FT ADLT (ELECTROSURGICAL) ×1 IMPLANT
EVACUATOR SILICONE 100CC (DRAIN) IMPLANT
FORCEPS BIPOLAR SPETZLER 8 1.0 (NEUROSURGERY SUPPLIES) ×2 IMPLANT
GAUZE SPONGE 4X4 12PLY STRL (GAUZE/BANDAGES/DRESSINGS) IMPLANT
GAUZE SPONGE 4X4 16PLY XRAY LF (GAUZE/BANDAGES/DRESSINGS) ×6 IMPLANT
GLOVE BIO SURGEON STRL SZ7 (GLOVE) IMPLANT
GLOVE BIOGEL PI IND STRL 7.0 (GLOVE) IMPLANT
GLOVE BIOGEL PI IND STRL 7.5 (GLOVE) ×1 IMPLANT
GLOVE BIOGEL PI INDICATOR 7.0 (GLOVE)
GLOVE BIOGEL PI INDICATOR 7.5 (GLOVE) ×6
GLOVE ECLIPSE 7.0 STRL STRAW (GLOVE) ×6 IMPLANT
GLOVE SURG SS PI 7.0 STRL IVOR (GLOVE) ×8 IMPLANT
GOWN STRL REUS W/ TWL LRG LVL3 (GOWN DISPOSABLE) ×2 IMPLANT
GOWN STRL REUS W/ TWL XL LVL3 (GOWN DISPOSABLE) IMPLANT
GOWN STRL REUS W/TWL 2XL LVL3 (GOWN DISPOSABLE) IMPLANT
GOWN STRL REUS W/TWL LRG LVL3 (GOWN DISPOSABLE) ×9
GOWN STRL REUS W/TWL XL LVL3 (GOWN DISPOSABLE)
HEMOSTAT POWDER KIT SURGIFOAM (HEMOSTASIS) ×4 IMPLANT
HEMOSTAT SURGICEL 2X14 (HEMOSTASIS) ×3 IMPLANT
HOOK DURA (MISCELLANEOUS) ×1 IMPLANT
HOOK DURA 1/2IN (MISCELLANEOUS) ×3 IMPLANT
KIT BASIN OR (CUSTOM PROCEDURE TRAY) ×3 IMPLANT
KIT DRAIN CSF ACCUDRAIN (MISCELLANEOUS) IMPLANT
KIT ROOM TURNOVER OR (KITS) ×3 IMPLANT
KNIFE ARACHNOID DISP AM-24-S (MISCELLANEOUS) ×2 IMPLANT
NDL HYPO 25GX1X1/2 BEV (NEEDLE) IMPLANT
NDL HYPO 25X1 1.5 SAFETY (NEEDLE) ×1 IMPLANT
NDL SPNL 25GX3.5 QUINCKE BL (NEEDLE) IMPLANT
NEEDLE HYPO 25GX1X1/2 BEV (NEEDLE) IMPLANT
NEEDLE HYPO 25X1 1.5 SAFETY (NEEDLE) ×3 IMPLANT
NEEDLE SPNL 25GX3.5 QUINCKE BL (NEEDLE) IMPLANT
NS IRRIG 1000ML POUR BTL (IV SOLUTION) ×7 IMPLANT
OIL CARTRIDGE MAESTRO DRILL (MISCELLANEOUS) ×3
PACK CRANIOTOMY (CUSTOM PROCEDURE TRAY) ×3 IMPLANT
PAD ARMBOARD 7.5X6 YLW CONV (MISCELLANEOUS) ×7 IMPLANT
PAD DRESSING TELFA 3X8 NADH (GAUZE/BANDAGES/DRESSINGS) ×1 IMPLANT
PATTIES SURGICAL .25X.25 (GAUZE/BANDAGES/DRESSINGS) IMPLANT
PATTIES SURGICAL .5 X.5 (GAUZE/BANDAGES/DRESSINGS) ×2 IMPLANT
PATTIES SURGICAL .5 X3 (DISPOSABLE) ×2 IMPLANT
PATTIES SURGICAL 1/4 X 3 (GAUZE/BANDAGES/DRESSINGS) IMPLANT
PATTIES SURGICAL 1X1 (DISPOSABLE) IMPLANT
PERFORATOR LRG  14-11MM (BIT)
PERFORATOR LRG 14-11MM (BIT) IMPLANT
PIN MAYFIELD SKULL DISP (PIN) ×2 IMPLANT
PLATE 1.5  2HOLE LNG NEURO (Plate) ×4 IMPLANT
PLATE 1.5 2HOLE LNG NEURO (Plate) IMPLANT
PLATE 1.5 4HOLE LONG STRAIGHT (Plate) ×2 IMPLANT
RUBBERBAND STERILE (MISCELLANEOUS) ×6 IMPLANT
SCREW SELF DRILL HT 1.5/4MM (Screw) ×12 IMPLANT
SPONGE NEURO XRAY DETECT 1X3 (DISPOSABLE) IMPLANT
SPONGE SURGIFOAM ABS GEL 100 (HEMOSTASIS) ×3 IMPLANT
STAPLER VISISTAT 35W (STAPLE) ×5 IMPLANT
STOCKINETTE 6  STRL (DRAPES) ×2
STOCKINETTE 6 STRL (DRAPES) ×1 IMPLANT
SUT ETHILON 3 0 FSL (SUTURE) IMPLANT
SUT NURALON 4 0 TR CR/8 (SUTURE) ×6 IMPLANT
SUT VIC AB 0 CT1 18XCR BRD8 (SUTURE) ×2 IMPLANT
SUT VIC AB 0 CT1 8-18 (SUTURE) ×6
SUT VIC AB 3-0 SH 8-18 (SUTURE) ×3 IMPLANT
TIP NONSTICK .5MMX23CM (INSTRUMENTS)
TIP NONSTICK .5X23 (INSTRUMENTS) IMPLANT
TOWEL GREEN STERILE (TOWEL DISPOSABLE) ×3 IMPLANT
TOWEL GREEN STERILE FF (TOWEL DISPOSABLE) ×2 IMPLANT
TRAY FOLEY W/METER SILVER 16FR (SET/KITS/TRAYS/PACK) IMPLANT
UNDERPAD 30X30 (UNDERPADS AND DIAPERS) IMPLANT
WATER STERILE IRR 1000ML POUR (IV SOLUTION) ×3 IMPLANT

## 2016-12-02 NOTE — Anesthesia Postprocedure Evaluation (Signed)
Anesthesia Post Note  Patient: Bryan [name]  Procedure(s) Performed: Procedure(s) (LRB): CRANIOTOMY FOR CLIPPING OF INTRACRANIAL ANEURYSM (Left)  Patient location during evaluation: ICU Anesthesia Type: General Level of consciousness: patient remains intubated per anesthesia plan Vital Signs Assessment: post-procedure vital signs reviewed and stable Respiratory status: patient remains intubated per anesthesia plan Cardiovascular status: stable Anesthetic complications: no       Last Vitals:  Vitals:   12/02/16 2030 12/02/16 2100  BP: (!) 146/57 (!) 138/57  Pulse: 73 72  Resp: (!) 28 (!) 28  Temp:      Last Pain:  Vitals:   12/02/16 2000  TempSrc: Axillary                 Bryan [name]

## 2016-12-02 NOTE — Op Note (Signed)
PREOP DIAGNOSIS:  1. Subarachnoid Hemorrhage 2. Left Pcom aneurysm  POSTOP DIAGNOSIS:  Same  PROCEDURE: 1. Left pterional craniotomy for clipping of posterior communicating artery aneurysm 2. Use of operating microscope for microdissection   SURGEON: Dr. Lisbeth RenshawNeelesh Grayton Lobo, MD  ASSISTANT: Dr. Donalee CitrinGary Cram, MD  ANESTHESIA: General Endotracheal  EBL: 100cc  SPECIMENS: None  DRAINS: None  COMPLICATIONS: None immediate  CONDITION: Hemodynamically stable to ICU  HISTORY: Kem KaysDarren D Lucado is a 36 y.o. male presenting to the ED yesterday with sudden onset severe headache. CT scan did demonstrate diffuse subarachnoid hemorrhage. 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 The patient started to develop some shortness of breath and difficulty breathing likely due to body habitus and required preoperative intubation. He subsequently underwent attempt at diagnostic cerebral angiogram however his Oddi habitus precluded standard placement of right femoral arterial sheath. He therefore was brought to the operating room for surgical clip ligation of the previously diagnosed left posterior creating artery aneurysm by CTA. The risks and benefits of the surgical procedure were explained in detail to both the patient and his family prior to intubation. After all questions were answered, informed consent was obtained.  PROCEDURE IN DETAIL: The patient was positioned on the operative table in the supine position in the [place] head holder. All pressure points were meticulously padded. Skin incision was then marked out and prepped and draped in the usual sterile fashion.  After time-out was conducted, skin incision was made sharply and Bovie electrocautery was used to dissect the subcutaneous tissue and galea. Raney clips were then used to secure hemostasis on the skin edges. The superficial temporal artery was dissected free and retracted with the skin flap. Bovie  electrocautery was used to dissect through the pericranium as well as the temporalis fascia and muscle. A combination of electrocautery and periosteal elevators was used to elevate a frontotemporal flap, after the temporalis fascia was elevated in the interfascial plane. Fishhooks were then used for retraction. Temporalis muscle was then incised and reflected inferiorly. Bur holes were then created in the pterion, above the root of the zygoma, and the superior temporal line. These are then connected with the craniotome and a standard pterional craniotomy flap was elevated. Hemostasis was achieved on the bone edges, and a high-speed drill was used to drill down the lesser wing of the sphenoid.  The dura was then opened in curvilinear fashion and good hemostasis was achieved on the dural edges. At this point the microscope was draped and brought into the field and the remainder of the case was done under the microscope using microdissection.  The brain was noted to be significantly edematous and red with diffuse subarachnoid blood. Mannitol was given intravenously with Lasix in an attempt to reduce brain swelling. The optic nerve and the carotid artery were identified, as was the oculomotor nerve. The optical carotid cistern was opened sharply for egress of CSF. Due to significant tension of the brain, retractor was placed to elevate the frontal lobe to allow exposure of the internal carotid artery. Dissection along the lateral edge of the internal carotid artery in the medial edge of the oculomotor nerve appeared to identify the neck of the aneurysm just distal to the tip of the anterior clinoid process. Dissection was then carried out to allow placement of proximal and distal clip blades. The posterior communicating artery was identified. A mini curved Yasargil titanium clip was selected and placed across the neck of the aneurysm. Further dissection was carried out, and  the carotid artery appeared widely patent  as was the posterior communicating artery. I was unable to fully mobilize the internal carotid artery due to the proximal nature of the aneurysm. I did not feel that anterior clinoidectomy would be warranted in order to further mobilize the carotid, and visualize the aneurysm neck and dome.  At this point the wound is irrigated with copious amounts of normal saline irrigation. Good hemostasis was confirmed on the brain surface. The dura was then covered with dura matrix. The bone flap was replaced and plated with standard titanium plates and screws. The temporalis muscle and fascia was reapproximated with interrupted 0 Vicryl stitches, and the galea was closed with interrupted 0 and 3-0 Vicryl stitches. Skin was closed with standard surgical skin staples. Sterile dressing was then applied, and headwrap was applied.  At the end of the case all sponge, needle, cottonoid, and instrument counts were correct. The patient was then transferred to the bed, and taken to the intensive care unit in stable hemodynamic condition.

## 2016-12-02 NOTE — Progress Notes (Signed)
PT Cancellation Note  Patient Details Name: Bryan [name] MRN: [id number] DOB: [birth year]   Cancelled Treatment:    Reason Eval/Treat Not Completed: Patient not medically ready Pt on bedrest. Will await increase in activity orders prior to PT evaluation.   Blake DivineShauna A Steele Ledonne 12/02/2016, 8:26 AM Mylo RedShauna Gilda Abboud, PT, DPT [phone]

## 2016-12-02 NOTE — Procedures (Signed)
Intubation Procedure Note Bryan [name] [id number] May 03, 1981  Procedure: Intubation Indications: Airway protection and maintenance  Procedure Details Consent: Risks of procedure as well as the alternatives and risks of each were explained to the (patient/caregiver).  Consent for procedure obtained. Time Out: Verified patient identification, verified procedure, site/side was marked, verified correct patient position, special equipment/implants available, medications/allergies/relevent history reviewed, required imaging and test results available.  Performed  Drugs:  100 mcg Fentanyl, 2 mg Versed, 20 mg Etomidate, 50 mg Rocuronium. VL x 1 with # 4 blade. Grade 2 view. 7.5 tube visualized passing through vocal cords. Following intubation:  positive color change on ETCO2, condensation seen in endotracheal tube, equal breath sounds bilaterally.  Evaluation Hemodynamic Status: BP stable throughout; O2 sats: stable throughout Patient's Current Condition: stable Complications: No apparent complications Patient did tolerate procedure well. Chest X-ray ordered to verify placement.  CXR: pending.  I was present and supervised the entire procedure.  Alyson ReedyWesam G. Yacoub, M.D. [facility]. Pager: [phone]. After hours pager: [phone]. Rutherford Guysahul Desai, [place] - [facility] Pager: [phone]  or [phone] 12/02/2016, 10:25 AM

## 2016-12-02 NOTE — Transfer of Care (Signed)
Immediate Anesthesia Transfer of Care Note  Patient: Bryan KaysDarren D Emminger  Procedure(s) Performed: Procedure(s): CRANIOTOMY FOR CLIPPING OF INTRACRANIAL ANEURYSM (Left)  Patient Location: PACU and NICU  Anesthesia Type:General  Level of Consciousness: sedated, unresponsive and Patient remains intubated per anesthesia plan  Airway & Oxygen Therapy: Patient remains intubated per anesthesia plan and Patient placed on Ventilator (see vital sign flow sheet for setting)  Post-op Assessment: Report given to RN and Post -op Vital signs reviewed and stable  Post vital signs: Reviewed and stable  Last Vitals:  Vitals:   12/02/16 1330 12/02/16 1350  BP: 121/70 126/67  Pulse: 84 91  Resp: (!) 28 (!) 25  Temp:      Last Pain:  Vitals:   12/02/16 1200  TempSrc: Axillary         Complications: No apparent anesthesia complications

## 2016-12-02 NOTE — Progress Notes (Signed)
Transported from IR to OR without complication.

## 2016-12-02 NOTE — Anesthesia Postprocedure Evaluation (Signed)
Anesthesia Post Note  Patient: Bryan [name]  Procedure(s) Performed: Procedure(s) (LRB): RADIOLOGY WITH ANESTHESIA (N/A)  Patient location during evaluation: Other Anesthesia Type: MAC Level of consciousness: patient remains intubated per anesthesia plan Pain management: pain level controlled Vital Signs Assessment: post-procedure vital signs reviewed and stable Respiratory status: respiratory function stable and patient remains intubated per anesthesia plan Cardiovascular status: stable and blood pressure returned to baseline Anesthetic complications: no Comments: To OR for surgery       Last Vitals:  Vitals:   12/02/16 2015 12/02/16 2030  BP: 118/68 (!) 146/57  Pulse: 76 73  Resp: (!) 21 (!) 28  Temp:      Last Pain:  Vitals:   12/02/16 2000  TempSrc: Axillary                 Ahmar Pickrell EDWARD

## 2016-12-02 NOTE — Care Management Note (Signed)
Case Management Note  Patient Details  Name: Kem KaysDarren D Blayney MRN: [id number] Date of Birth: 10/01/1980  Subjective/Objective:  Pt admitted on 12/01/16 with Grade 2 subarachnoid hemorrhage, possible small Lt PCOM segment aneurysm.  PTA, pt independent, lives with mother and step-father.                    Action/Plan: Pt currently intubated; will follow for discharge planning as pt progresses.   Expected Discharge Date:                  Expected Discharge Plan:  IP Rehab Facility  In-House Referral:     Discharge planning Services  CM Consult  Post Acute Care Choice:    Choice offered to:     DME Arranged:    DME Agency:     HH Arranged:    HH Agency:     Status of Service:  In process, will continue to follow  If discussed at Long Length of Stay Meetings, dates discussed:    Additional Comments:  Quintella BatonJulie W. Jaeceon Michelin, RN, BSN  Trauma/Neuro ICU Case Manager [phone]

## 2016-12-02 NOTE — Procedures (Signed)
OGT Placement By MD  Under direct laryngoscopy, OGT placed and advanced, confirmed via auscultation.  CXR ordered and pending.  Alyson ReedyWesam G. Yacoub, M.D. [facility]. Pager: [phone]. After hours pager: [phone].

## 2016-12-02 NOTE — Anesthesia Preprocedure Evaluation (Signed)
Anesthesia Evaluation  Patient identified by MRN, date of birth, ID band Patient awake    Reviewed: Allergy & Precautions, H&P , Patient's Chart, lab work & pertinent test results, reviewed documented beta blocker date and time   Airway Mallampati: Intubated  TM Distance: >3 FB Neck ROM: full    Dental no notable dental hx.    Pulmonary    breath sounds clear to auscultation   + intubated    Cardiovascular  Rhythm:regular Rate:Normal     Neuro/Psych    GI/Hepatic   Endo/Other    Renal/GU      Musculoskeletal   Abdominal   Peds  Hematology   Anesthesia Other Findings   Reproductive/Obstetrics                             Anesthesia Physical Anesthesia Plan  ASA: III  Anesthesia Plan: MAC   Post-op Pain Management:    Induction:   Airway Management Planned: Oral ETT  Additional Equipment:   Intra-op Plan: Delibrate Circulatory arrest per surgeon request  Post-operative Plan: Post-operative intubation/ventilation  Informed Consent: I have reviewed the patients History and Physical, chart, labs and discussed the procedure including the risks, benefits and alternatives for the proposed anesthesia with the patient or authorized representative who has indicated his/her understanding and acceptance.   Dental Advisory Given  Plan Discussed with: CRNA and Surgeon  Anesthesia Plan Comments:         Anesthesia Quick Evaluation

## 2016-12-02 NOTE — Progress Notes (Signed)
OT Cancellation Note  Patient Details Name: [name] MRN: [id number] DOB: 02/03/1981   Cancelled Treatment:    Reason Eval/Treat Not Completed: Patient not medically ready. Pt on bedrest. Please update activity orders when appropriate for therapy. Thanks.  [facility]  Aidah Forquer, OT/L  [phone] 12/02/2016 12/02/2016, 6:47 AM

## 2016-12-02 NOTE — Progress Notes (Signed)
Initial Nutrition Assessment  DOCUMENTATION CODES:   Morbid obesity  INTERVENTION:   If patient remains intubated recommend initiating the Adult Tube Feeding Protocol.    NUTRITION DIAGNOSIS:   Inadequate oral intake related to inability to eat as evidenced by NPO status.  GOAL:   Provide needs based on ASPEN/SCCM guidelines  MONITOR:   Vent status, I & O's  REASON FOR ASSESSMENT:   Ventilator    ASSESSMENT:   Pt with PMH of morbid obesity and varicose veins admitted with sudden HA. CTA demonstrated diffuse SAH over both convexities and extending into the basal cisterns. 5/8 pt with worsening hypoxemia and increased WOB and intubated.    Pt discussed during ICU rounds and with RN.  Intubated this am, plan for angiogram today.  Patient is currently intubated on ventilator support Propofol: 89.2 ml/hr (70 mcg) provides 2354 kcal per day   Diet Order:  Diet NPO time specified  Skin:  Reviewed, no issues  Last BM:  unknown  Height:   Ht Readings from Last 1 Encounters:  12/02/16 5\' 11"  (1.803 m)    Weight:   Wt Readings from Last 1 Encounters:  12/02/16 (!) 468 lb (212.3 kg)    Ideal Body Weight:  78.1 kg  BMI:  Body mass index is 65.27 kg/m.  Estimated Nutritional Needs:   Kcal:  [id number]  Protein:  195 grams  Fluid:  >1.7 L/day  EDUCATION NEEDS:   No education needs identified at this time  Kendell BaneHeather Lukisha Procida RD, LDN, CNSC [phone] Pager [phone] After Hours Pager

## 2016-12-02 NOTE — Progress Notes (Addendum)
Pt was tenuously positioned on the angio table and secured with multiple layers of tape. Due to his size, we could not use the lateral plane fluoro. 018 wire access to the femoral artery was ultimately obtained with ultrasound guidance and a 20G spinal needle which required depression of the subcutaneous tissue and was hubbed. Despite multiple attempts in a similar fashion with varying transitional dilators, I was unable to obtain 035 wire access without kinking the sheath. At this point given the difficulty with access, lack of biplane fluoro, and his body habitus, I did not feel that endovascular coiling of the aneurysm would be feasible. The question therefore became whether there was a need for diagnostic images which would require more aggressive attempts at access such as femoral cut-down or transradial access, or whether the CTA previously done provided enough information to proceed with clipping. After review of the CTA, I believe there is sufficient quality to proceed with surgery and I therefore elected to abort further attempts at angiography. I updated the patient's family regarding the above and the need to proceed with surgical clipping. Risks of surgery were again reviewed. All questions were answered.

## 2016-12-02 NOTE — OR Nursing (Signed)
Consent form for Craniotomy reviewed during time-out, due to emergent nature of case, consent obtained by Dr. Conchita ParisNundkumar and Mother of patient.

## 2016-12-02 NOTE — Progress Notes (Signed)
Dr. Jordan LikesPool contacted in regards to patient's blood pressure and respiratory status. Notified MD that patient's blood pressure is above 140 while at maximum rate of Cardene. Per MD maintain blood pressure less than 160 systolic. MD aware of tachypnea, snoring respirations, increased drowsiness, and oxygen saturation in low 90s. No new orders at this time per MD. Will continue to monitor patient.

## 2016-12-02 NOTE — Progress Notes (Signed)
SLP Cancellation Note  Patient Details Name: Bryan KaysDarren D Herrada MRN: [id number] DOB: March 08, 1981   Cancelled treatment:       Reason Eval/Treat Not Completed: Medical issues which prohibited therapy. Pt now intubated - will f/u for cognitive evaluation as able.   Maxcine Hamaiewonsky, Shanikka Wonders 12/02/2016, 1:28 PM  Maxcine HamLaura Paiewonsky, M.A. CCC-SLP [phone]

## 2016-12-02 NOTE — Consult Note (Signed)
PULMONARY / CRITICAL CARE MEDICINE   Name: Bryan [name] MRN: [id number] DOB: [birth year]    ADMISSION DATE:  12/01/2016 CONSULTATION DATE:  12/02/16  REFERRING MD:  Patric Dykes  CHIEF COMPLAINT:  Headache  HISTORY OF PRESENT ILLNESS:  Pt is encephalopathic; therefore, this HPI is obtained from chart review. Bryan [name] is a 36 y.o. male with PMH of morbid obesity, varicose veins who presented to [facility] ED 12/01/16 with sudden onset of headache that started earlier that morning.  He had CTA that demonstrated diffuse SAH over both convexities and extending into the basal cisterns.  He was admitted by neurosurgery with plans for angiogram 5/8.  That morning, he had worsening hypoxemia and increased WOB; therefore, PCCM was asked to see.  Given body habitus and high potential for clinical worsening, decision was made to intubate now versus wait till emergency situation.  PAST MEDICAL HISTORY :  He  has a past medical history of Complication of anesthesia and Varicose veins.  PAST SURGICAL HISTORY: He  has a past surgical history that includes lipo around heart (at age 55) and ORIF ankle fracture (Left, 09/02/2012).  Allergies  Allergen Reactions  . Penicillins Hives and Other (See Comments)    Mouth blisters - childhood allergy Has patient had a PCN reaction causing immediate rash, facial/tongue/throat swelling, SOB or lightheadedness with hypotension: Yes Has patient had a PCN reaction causing severe rash involving mucus membranes or skin necrosis: No Has patient had a PCN reaction that required hospitalization Yes Has patient had a PCN reaction occurring within the last 10 years: No If all of the above answers are "NO", then may proceed with Cephalosporin use.    No current facility-administered medications on file prior to encounter.    Current Outpatient Prescriptions on File Prior to Encounter  Medication Sig  . oxyCODONE-acetaminophen (PERCOCET/ROXICET) 5-325 MG per tablet Take 1-2  tablets by mouth every 4 (four) hours as needed. (Patient not taking: Reported on 01/12/2015)    FAMILY HISTORY:  His indicated that his mother is alive. He indicated that his father is alive.    SOCIAL HISTORY: He  reports that he has never smoked. His smokeless tobacco use includes Chew. He reports that he drinks alcohol. He reports that he does not use drugs.  REVIEW OF SYSTEMS:   Unable to obtain as pt is encephalopathic.  SUBJECTIVE:  On vent, sedated.  VITAL SIGNS: BP 126/63   Pulse 83   Temp 98.9 F (37.2 C) (Oral)   Resp (!) 28   Ht 5\' 11"  (1.803 m)   Wt (!) 212.3 kg (468 lb)   SpO2 97%   BMI 65.27 kg/m   HEMODYNAMICS:    VENTILATOR SETTINGS: FiO2 (%):  [0 %-100 %] 100 %  INTAKE / OUTPUT: I/O last 3 completed shifts: In: 1656.4 [I.V.:1656.4] Out: 325 [Urine:325]   PHYSICAL EXAMINATION: General: Young AA male, morbidly obese, in NAD. Neuro: Sedated, non-responsive. HEENT: [place]/AT. PERRL, sclerae anicteric. Cardiovascular: RRR, no M/R/G.  Lungs: Respirations even and unlabored.  CTA bilaterally, No W/R/R.  Abdomen: Morbidly obese, BS distant, soft, NT/ND.  Musculoskeletal: No gross deformities, no edema appreciated.  Skin: Intact, warm, no rashes.  LABS:  BMET No results for input(s): NA, K, CL, CO2, BUN, CREATININE, GLUCOSE in the last 168 hours.  Electrolytes No results for input(s): CALCIUM, MG, PHOS in the last 168 hours.  CBC  Recent Labs Lab 12/01/16 2343  WBC 7.9  HGB 12.3*  HCT 39.4  PLT 280  Coag's  Recent Labs Lab 12/01/16 2343  APTT 29  INR 1.02    Sepsis Markers No results for input(s): LATICACIDVEN, PROCALCITON, O2SATVEN in the last 168 hours.  ABG No results for input(s): PHART, PCO2ART, PO2ART in the last 168 hours.  Liver Enzymes No results for input(s): AST, ALT, ALKPHOS, BILITOT, ALBUMIN in the last 168 hours.  Cardiac Enzymes No results for input(s): TROPONINI, PROBNP in the last 168 hours.  Glucose No  results for input(s): GLUCAP in the last 168 hours.  Imaging Ct Angio Head W Or Wo Contrast  Result Date: 12/01/2016 CLINICAL DATA:  Hypertension, syncope and headache. EXAM: CT ANGIOGRAPHY HEAD TECHNIQUE: Multidetector CT imaging of the head was performed using the standard protocol during bolus administration of intravenous contrast. Multiplanar CT image reconstructions and MIPs were obtained to evaluate the vascular anatomy. CONTRAST:  100 mL Isovue 370 COMPARISON:  None. FINDINGS: CT HEAD Brain: There is diffuse subarachnoid hemorrhage over both convexities and within the sylvian fissures. Additionally, there is blood within the interpeduncular cistern and both quadrigeminal plate cisterns, as well as within the lateral ventricles. The basal cisterns are largely effaced. There is no cerebellar tonsillar herniation. No midline shift. Skull: Normal visualized skull base, calvarium and extracranial soft tissues. Sinuses/Orbits: No sinus fluid levels or advanced mucosal thickening. No mastoid effusion. Normal orbits. CTA HEAD FINDINGS Contrast bolus timing is suboptimal because the aortic arch could not be used for bolus tracking because of patient body habitus. Anterior circulation: There is an inferiorly and laterally directed outpouching from the communicating segment of the left internal carotid artery, at the origin of the left p-comm, that measures 3 mm at its neck and 5 mm base to apex. This is best seen on series 22, image 91 and series 23 images 116 and 117. The anterior circulation is patent without high-grade stenosis. Posterior circulation: There is poor opacification of the basilar artery, which may be congenital variation due to the presence of bilateral posterior communicating arteries. This also could be an artifact bolus timing. The posterior circulation is otherwise normal. Venous sinuses: As permitted by contrast timing, patent. Anatomic variants: None Delayed phase: No parenchymal contrast  enhancement. Review of the MIP images confirms the above findings IMPRESSION: 1. Diffuse subarachnoid hemorrhage over both convexities and extending into the basal cisterns, which are markedly narrowed. Critical Value/emergent results were called by telephone at the time of interpretation on 12/01/2016 at 8:05 pm to Dr. Erin HearingMESSNER, who verbally acknowledged these results. 2. CTA images are degraded by poor bolus timing, as patient body habitus prevented use of the aortic arch for bolus tracking. 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 Critical Value/emergent results were called by telephone at the time of interpretation on 12/01/2016 at 8:47 pm to Manus RuddSchulz, NP who verbally acknowledged these results. 3. 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 Electronically Signed   By: Deatra RobinsonKevin  Herman M.D.   On: 12/01/2016 20:49   Ct Cervical Spine Wo Contrast  Result Date: 12/01/2016 CLINICAL DATA:  Hypertension and syncope EXAM: CT CERVICAL SPINE WITHOUT CONTRAST TECHNIQUE: Multidetector CT imaging of the cervical spine was performed without intravenous contrast. Multiplanar CT image reconstructions were also generated. COMPARISON:  None. FINDINGS:  Assessment of the cervical spine is severely degraded by beam attenuation secondary to patient body habitus. Within that limitation, the alignment of the cervical spine is normal. There is no visible fracture. There is an azygos lobe on the right, that extends above the thoracic inlet. IMPRESSION: 1. Markedly degraded examination of the cervical spine due to beam attenuation. Within that limitation, no acute fracture or static subluxation is visualized. 2.  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 This is likely normal anatomic variation. Correlation with chest radiographs could be considered. Electronically Signed   By: Deatra Robinson M.D.   On: 12/01/2016 20:31     STUDIES:  CTA head 5/7 > diffuse SAH over both convexities and extending into the basal cisterns.   CULTURES: None.  ANTIBIOTICS: None.  SIGNIFICANT EVENTS: 5/7 > admit. 5/8 > PCCM consult > intubation.  LINES/TUBES: ETT 5/8 >  DISCUSSION: 36 y.o. male admitted 5/7 with SAH.  Required intubation 5/8 due to hypoxemia and increased WOB.  ASSESSMENT / PLAN:  NEUROLOGIC A:   Acute encephalopathy - due to sedation. 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 P:   Sedation:  Propofol gtt / Fentanyl gtt. RASS goal: -1 to -2. Daily WUA. Neurosurgery following, planning for angiogram today with possible coiling. Continue empiric nimodipine x 21 days.  PULMONARY A: Acute hypoxic respiratory failure - requiring intubation 5/8. P:   Full vent support. Wean as able. VAP prevention measures. SBT in AM if able. CXR in AM.  CARDIOVASCULAR A:  HTN. P:  Continue cardene gtt, goal SBP < 180. Likely needs to be started on oral regimen prior to d/c - see how BP does following extubation.  RENAL A:   No acute issues. P:   NS @ 75. BMP in AM.  GASTROINTESTINAL A:   GI prophylaxis. Nutrition. Morbid Obesity. P:   SUP: Pantoprazole. NPO. Importance of weight loss encouraged to mother.  HEMATOLOGIC A:   VTE Prophylaxis. P:  SCD's. CBC in AM.  INFECTIOUS A:   No indication of infection. P:   Monitor clinically.  ENDOCRINE A:   No acute issues.   P:   No interventions required.   Family updated: Mother updated by Dr. Molli Knock.  Interdisciplinary Family Meeting v Palliative Care Meeting:  Due by: 12/09/16.  CC time: 30 min.   Rutherford Guys, [place] - [facility] [place] [facility] Pager: [phone]  or [phone] 12/02/2016, 10:27 AM  Attending Note:  36 year old male who is super morbidly obese presenting with a SAH and respiratory failure due to body habitus and position.  Patient was been desaturating.  On exam, he is awake and interactive but visibly struggling to breath.  I reviewed head CT myself and SAH noted.  Spoke with mother.  Informed of condition and how important it is to loose weight.  Discussed with Dr. Conchita Paris, will intubate and proceed with full vent support.  ABG and adjust vent for ABG.  CXR ordered.  PCCM will continue to follow.  The patient is critically ill with multiple organ systems failure and requires high complexity decision making for assessment and support, frequent evaluation and titration of therapies, application of advanced monitoring technologies and extensive interpretation of multiple databases.   Critical Care Time devoted to patient care services described in this note is  35  Minutes. This time reflects time of care of this signee Dr Koren Bound. This critical care time does not reflect  procedure time, or teaching time or supervisory time of PA/NP/Med student/Med Resident etc but could involve care discussion time.  Rush Farmer, M.D. [facility]. Pager: [phone]. After hours pager: [phone].

## 2016-12-02 NOTE — Progress Notes (Signed)
No issues overnight. Pt has SOB when laying supine, improved somewhat upright with NRB.  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 I explained to them that intracranial aneurysm was the cause for [facility] and that the definitive diagnosis is made by diagnostic angiogram. I also explained to them the possible treatment options for intracranial aneurysms including endovascular coiling and open clip ligation. The risks of the angiogram, coiling, and surgical clipping were also reviewed to include stroke and aneurysm re-rupture leading to weakness/paralysis/coma/death, infection, SZ, hydrocephalus.   The patient and his family understood our discussion and the provided consent to proceed with diagnostic angiogram and the appropriate treatment for any identified aneurysm.

## 2016-12-02 NOTE — Anesthesia Preprocedure Evaluation (Addendum)
Anesthesia Evaluation  Patient identified by MRN, date of birth, ID band  Reviewed: Allergy & Precautions, H&P , NPO status , Patient's Chart, lab work & pertinent test results  Airway Mallampati: Intubated       Dental  (+) Dental Advisory Given   Pulmonary neg pulmonary ROS,    breath sounds clear to auscultation       Cardiovascular hypertension,  Rhythm:Regular Rate:Normal     Neuro/Psych negative neurological ROS  negative psych ROS   GI/Hepatic negative GI ROS, Neg liver ROS,   Endo/Other  Morbid obesity  Renal/GU negative Renal ROS  negative genitourinary   Musculoskeletal negative musculoskeletal ROS (+)   Abdominal (+) + obese,   Peds negative pediatric ROS (+)  Hematology negative hematology ROS (+)   Anesthesia Other Findings   Reproductive/Obstetrics negative OB ROS                             Anesthesia Physical  Anesthesia Plan  ASA: III  Anesthesia Plan: General   Post-op Pain Management:    Induction: Inhalational  Airway Management Planned: Oral ETT  Additional Equipment: Arterial line  Intra-op Plan:   Post-operative Plan: Post-operative intubation/ventilation  Informed Consent: I have reviewed the patients History and Physical, chart, labs and discussed the procedure including the risks, benefits and alternatives for the proposed anesthesia with the patient or authorized representative who has indicated his/her understanding and acceptance.     Plan Discussed with: CRNA, Anesthesiologist and Surgeon  Anesthesia Plan Comments:       Anesthesia Quick Evaluation

## 2016-12-02 NOTE — Sedation Documentation (Signed)
Pt brought to IR #2 in bed accompanied by CRNA, resp and RN. Pt wakes up, follows commands and answers simple questions by nodding yes and no. PERRL. Intubated, in no distress.

## 2016-12-02 NOTE — Anesthesia Procedure Notes (Signed)
Arterial Line Insertion Start/End5/02/2017 6:45 PM Performed by: Gwenyth AllegraADAMI, Nell Gales, CRNA  radial was placed Catheter size: 20 G Maximum sterile barriers used   Procedure performed without using ultrasound guided technique. Following insertion, dressing applied and Biopatch. Post procedure assessment: normal  Patient tolerated the procedure well with no immediate complications.

## 2016-12-02 NOTE — Progress Notes (Signed)
Transported to IR without complications and left with CRNA on anesthesia machine.

## 2016-12-02 NOTE — Transfer of Care (Signed)
Immediate Anesthesia Transfer of Care Note  Patient: Bryan [name]  Procedure(s) Performed: Procedure(s): RADIOLOGY WITH ANESTHESIA (N/A)  Patient Location: OR 21 for crani  Anesthesia Type:General  Level of Consciousness: Patient remains intubated per anesthesia plan  Airway & Oxygen Therapy: Patient remains intubated per anesthesia plan and Patient placed on Ventilator (see vital sign flow sheet for setting)  Post-op Assessment: Report given to RN and Post -op Vital signs reviewed and stable  Post vital signs: Reviewed and stable  Last Vitals:  Vitals:   12/02/16 1330 12/02/16 1350  BP: 121/70 126/67  Pulse: 84 91  Resp: (!) 28 (!) 25  Temp:      Last Pain:  Vitals:   12/02/16 1200  TempSrc: Axillary         Complications: No apparent anesthesia complications

## 2016-12-03 ENCOUNTER — Inpatient Hospital Stay (HOSPITAL_COMMUNITY): Payer: BLUE CROSS/BLUE SHIELD

## 2016-12-03 ENCOUNTER — Encounter (HOSPITAL_COMMUNITY): Payer: Self-pay | Admitting: Neurosurgery

## 2016-12-03 DIAGNOSIS — I609 Nontraumatic subarachnoid hemorrhage, unspecified: Secondary | ICD-10-CM

## 2016-12-03 DIAGNOSIS — J810 Acute pulmonary edema: Secondary | ICD-10-CM

## 2016-12-03 LAB — GLUCOSE, CAPILLARY
GLUCOSE-CAPILLARY: 122 mg/dL — AB (ref 65–99)
Glucose-Capillary: 125 mg/dL — ABNORMAL HIGH (ref 65–99)
Glucose-Capillary: 123 mg/dL — ABNORMAL HIGH (ref 65–99)
Glucose-Capillary: 117 mg/dL — ABNORMAL HIGH (ref 65–99)
Glucose-Capillary: 116 mg/dL — ABNORMAL HIGH (ref 65–99)
Glucose-Capillary: 119 mg/dL — ABNORMAL HIGH (ref 65–99)

## 2016-12-03 LAB — BASIC METABOLIC PANEL
ANION GAP: 9 (ref 5–15)
BUN: 20 mg/dL (ref 6–20)
CO2: 19 mmol/L — ABNORMAL LOW (ref 22–32)
Calcium: 8.2 mg/dL — ABNORMAL LOW (ref 8.9–10.3)
Chloride: 108 mmol/L (ref 101–111)
Creatinine, Ser: 1.37 mg/dL — ABNORMAL HIGH (ref 0.61–1.24)
GFR calc Af Amer: >60 mL/min (ref >60)
Glucose, Bld: 123 mg/dL — ABNORMAL HIGH (ref 65–99)
POTASSIUM: 3.6 mmol/L (ref 3.5–5.1)
SODIUM: 136 mmol/L (ref 135–145)

## 2016-12-03 LAB — CBC
HEMATOCRIT: 33.6 % — AB (ref 39.0–52.0)
HEMOGLOBIN: 10.6 g/dL — AB (ref 13.0–17.0)
MCH: 26.4 pg (ref 26.0–34.0)
MCHC: 31.5 g/dL (ref 30.0–36.0)
MCV: 83.6 fL (ref 78.0–100.0)
Platelets: 290 10*3/uL (ref 150–400)
RBC: 4.02 MIL/uL — ABNORMAL LOW (ref 4.22–5.81)
RDW: 13.9 % (ref 11.5–15.5)
WBC: 12.8 10*3/uL — AB (ref 4.0–10.5)

## 2016-12-03 LAB — MRSA CULTURE: Culture: NOT DETECTED

## 2016-12-03 LAB — PHOSPHORUS: PHOSPHORUS: 2.7 mg/dL (ref 2.5–4.6)

## 2016-12-03 LAB — MAGNESIUM: Magnesium: 1.9 mg/dL (ref 1.7–2.4)

## 2016-12-03 MED ORDER — VITAL HIGH PROTEIN PO LIQD
1000.0000 mL | ORAL | Status: DC
  Administered 2016-12-03: 1000 mL

## 2016-12-03 MED ORDER — MIDAZOLAM HCL 2 MG/2ML IJ SOLN
2.0000 mg | INTRAMUSCULAR | Status: DC | PRN
  Administered 2016-12-03 – 2016-12-04 (×8): 2 mg via INTRAVENOUS
  Filled 2016-12-03 (×8): qty 2

## 2016-12-03 MED ORDER — CHLORHEXIDINE GLUCONATE CLOTH 2 % EX PADS
6.0000 | MEDICATED_PAD | Freq: Every day | CUTANEOUS | Status: DC
  Administered 2016-12-03 – 2016-12-06 (×3): 6 via TOPICAL

## 2016-12-03 MED ORDER — PROPOFOL 1000 MG/100ML IV EMUL
INTRAVENOUS | Status: AC
  Filled 2016-12-03: qty 100

## 2016-12-03 MED ORDER — SODIUM CHLORIDE 0.9 % IV SOLN: INTRAVENOUS | Status: DC

## 2016-12-03 MED ORDER — SODIUM CHLORIDE 0.9% FLUSH
10.0000 mL | Freq: Two times a day (BID) | INTRAVENOUS | Status: DC
  Administered 2016-12-03 – 2016-12-04 (×3): 10 mL
  Administered 2016-12-04: 20 mL
  Administered 2016-12-06 – 2016-12-09 (×5): 10 mL
  Administered 2016-12-10 – 2016-12-11 (×2): 20 mL
  Administered 2016-12-11 – 2016-12-12 (×3): 10 mL
  Administered 2016-12-13: 20 mL
  Administered 2016-12-13 – 2016-12-14 (×2): 10 mL
  Administered 2016-12-14: 20 mL
  Administered 2016-12-15: 10 mL
  Administered 2016-12-16: 20 mL
  Administered 2016-12-16 – 2016-12-17 (×2): 10 mL
  Administered 2016-12-17 – 2016-12-18 (×2): 20 mL
  Administered 2016-12-18: 10 mL
  Administered 2016-12-19: 30 mL
  Administered 2016-12-20: 40 mL
  Administered 2016-12-20 – 2016-12-21 (×3): 10 mL
  Administered 2016-12-21: 40 mL
  Administered 2016-12-22 – 2016-12-26 (×8): 10 mL

## 2016-12-03 MED ORDER — ADULT MULTIVITAMIN LIQUID CH
15.0000 mL | Freq: Every day | ORAL | Status: DC
  Administered 2016-12-03 – 2016-12-23 (×20): 15 mL
  Filled 2016-12-03 (×22): qty 15

## 2016-12-03 MED ORDER — PRO-STAT SUGAR FREE PO LIQD
30.0000 mL | Freq: Two times a day (BID) | ORAL | Status: DC
  Administered 2016-12-03: 30 mL
  Filled 2016-12-03: qty 30

## 2016-12-03 MED ORDER — MIDAZOLAM HCL 2 MG/2ML IJ SOLN: 2.0000 mg | INTRAMUSCULAR | Status: DC | PRN

## 2016-12-03 MED ORDER — FUROSEMIDE 10 MG/ML IJ SOLN: 40.0000 mg | Freq: Once | INTRAMUSCULAR | Status: DC

## 2016-12-03 MED ORDER — VITAL HIGH PROTEIN PO LIQD
1000.0000 mL | ORAL | Status: DC
  Administered 2016-12-03: 1000 mL
  Administered 2016-12-03 – 2016-12-04 (×3)
  Administered 2016-12-04: 1000 mL
  Administered 2016-12-04: 04:00:00
  Administered 2016-12-05: 1000 mL
  Administered 2016-12-05 – 2016-12-06 (×8)
  Administered 2016-12-06: 1000 mL
  Administered 2016-12-06 – 2016-12-07 (×15)
  Administered 2016-12-07: 1000 mL
  Administered 2016-12-07 (×2)
  Administered 2016-12-08 – 2016-12-09 (×2): 1000 mL
  Administered 2016-12-10 – 2016-12-11 (×6)
  Administered 2016-12-11 (×2): 1000 mL
  Administered 2016-12-11: 14:00:00
  Administered 2016-12-12: 1000 mL
  Administered 2016-12-13 (×2)
  Administered 2016-12-13: 1000 mL
  Administered 2016-12-14 (×7)
  Administered 2016-12-14: 1000 mL
  Administered 2016-12-14 – 2016-12-15 (×2)
  Administered 2016-12-15 – 2016-12-16 (×2): 1000 mL
  Administered 2016-12-16
  Administered 2016-12-17: 1000 mL
  Administered 2016-12-17: 07:00:00
  Administered 2016-12-18 – 2016-12-21 (×4): 1000 mL
  Filled 2016-12-03 (×7): qty 1000

## 2016-12-03 MED ORDER — PRO-STAT SUGAR FREE PO LIQD
60.0000 mL | Freq: Three times a day (TID) | ORAL | Status: DC
  Administered 2016-12-03 – 2016-12-23 (×56): 60 mL
  Filled 2016-12-03 (×58): qty 60

## 2016-12-03 MED ORDER — SODIUM CHLORIDE 0.9% FLUSH
10.0000 mL | INTRAVENOUS | Status: DC | PRN
  Administered 2016-12-04 – 2016-12-23 (×2): 10 mL
  Filled 2016-12-03 (×2): qty 40

## 2016-12-03 MED FILL — Thrombin For Soln 5000 Unit: CUTANEOUS | Qty: 5000 | Status: AC

## 2016-12-03 NOTE — Progress Notes (Signed)
SLP Cancellation Note  Patient Details Name: Bryan [name] MRN: [id number] DOB: 1981-01-06   Cancelled treatment:       Reason Eval/Treat Not Completed: Medical issues which prohibited therapy   Maxcine Hamaiewonsky, Conny Moening 12/03/2016, 8:38 AM  Maxcine HamLaura Paiewonsky, M.A. CCC-SLP [phone]

## 2016-12-03 NOTE — Progress Notes (Signed)
Pt seen and examined. No issues overnight.   EXAM: Temp:  [97.5 F (36.4 C)-99.4 F (37.4 C)] 98.7 F (37.1 C) (05/09 1600) Pulse Rate:  [62-167] 81 (05/09 1600) Resp:  [19-28] 28 (05/09 1600) BP: (106-146)/(41-80) 135/74 (05/09 1600) SpO2:  [88 %-100 %] 94 % (05/09 1600) FiO2 (%):  [70 %-90 %] 70 % (05/09 1544) Intake/Output      05/09 0730 - 05/10 0729   I.V. (mL/kg) 1144 (5.4)   NG/GT 213.3   Total Intake(mL/kg) 1357.3 (6.4)   Urine (mL/kg/hr)    Emesis/NG output    Blood    Total Output     Net +1357.3        On propofol, Breathes over vent Pupils equal, reactive Moves extremities purposefully, not following commands  LABS: Lab Results  Component Value Date   CREATININE 1.37 (H) 12/03/2016   BUN 20 12/03/2016   NA 136 12/03/2016   K 3.6 12/03/2016   CL 108 12/03/2016   CO2 19 (L) 12/03/2016   Lab Results  Component Value Date   WBC 12.8 (H) 12/03/2016   HGB 10.6 (L) 12/03/2016   HCT 33.6 (L) 12/03/2016   MCV 83.6 12/03/2016   PLT 290 12/03/2016    IMPRESSION: - 36 y.o. male SAH d#2 s/p clipping left Pcom - VDRF, on sedation  PLAN: - Attempt to wean sedation - Cont supportive care - Nimotop/TCD monitoring

## 2016-12-03 NOTE — Progress Notes (Addendum)
PT Cancellation Note  Patient Details Name: Bryan KaysDarren D [name] MRN: [id number] DOB: 04-17-1981   Cancelled Treatment:    Reason Eval/Treat Not Completed: Patient not medically ready Pt on bedrest. Intubated and sedated post procedure yesterday. Will await increase in activity orders prior to PT evaluation.   Blake DivineShauna A Vayda Dungee 12/03/2016, 9:15 AM Mylo RedShauna Niana Martorana, PT, DPT [phone]

## 2016-12-03 NOTE — Progress Notes (Signed)
Nutrition Follow-up  DOCUMENTATION CODES:   Morbid obesity  INTERVENTION:   Increase Vital High Protein to 50 ml/hr (1200 ml/day) Increase Prostat to 60 ml TID MVI daily  Provides: 1800 kcal, 195 grams protein, and 1003 ml free water.   NUTRITION DIAGNOSIS:   Inadequate oral intake related to inability to eat as evidenced by NPO status. Ongoing.   GOAL:   Provide needs based on ASPEN/SCCM guidelines Progressing.   MONITOR:   TF tolerance, Vent status  REASON FOR ASSESSMENT:   Consult Enteral/tube feeding initiation and management  ASSESSMENT:   Pt with PMH of morbid obesity and varicose veins admitted with sudden HA. CTA demonstrated diffuse SAH over both convexities and extending into the basal cisterns. 5/8 pt with worsening hypoxemia and increased WOB and intubated.   Pt discussed during ICU rounds and with RN.  5/9 left crani for clipping  Remains intubated  Adult TF protocol started Labs and meds reviewed  Diet Order:  Diet NPO time specified  Skin:  Reviewed, no issues  Last BM:  unknown  Height:   Ht Readings from Last 1 Encounters:  12/02/16 5\' 11"  (1.803 m)    Weight:   Wt Readings from Last 1 Encounters:  12/02/16 (!) 468 lb (212.3 kg)    Ideal Body Weight:  78.1 kg  BMI:  Body mass index is 65.27 kg/m.  Estimated Nutritional Needs:   Kcal:  [id number]  Protein:  195 grams  Fluid:  >1.7 L/day  EDUCATION NEEDS:   No education needs identified at this time  Kendell BaneHeather Tylyn Stankovich RD, LDN, CNSC [phone] Pager [phone] After Hours Pager

## 2016-12-03 NOTE — Progress Notes (Signed)
PULMONARY / CRITICAL CARE MEDICINE   Name: Bryan [name] MRN: [id number] DOB: Jun 12, 1981    ADMISSION DATE:  12/01/2016 CONSULTATION DATE:  12/02/2016  REFERRING MD:  Dr. Patric Dykes   CHIEF COMPLAINT:  SAH  Brief:   36 y.o. male with PMH of morbid obesity, varicose veins who presented to [facility] ED 12/01/16 with sudden onset of headache that started earlier that morning. CTA demonstrated diffuse SAH over both convexities and extending into the basal cisterns. Admitted by neurosurgery with plans for angiogram 5/8 with possible clipping.   5/8 had worsening hypoxemia and increased WOB requiring intubation.   SUBJECTIVE:  On mechanical ventilation and sedated.   VITAL SIGNS: BP (!) 114/41   Pulse 75   Temp 99.4 F (37.4 C) (Axillary)   Resp (!) 28   Ht 5\' 11"  (1.803 m)   Wt (!) 212.3 kg (468 lb)   SpO2 96%   BMI 65.27 kg/m   HEMODYNAMICS:    VENTILATOR SETTINGS: Vent Mode: PRVC FiO2 (%):  [70 %-100 %] 70 % Set Rate:  [20 bmp-28 bmp] 28 bmp Vt Set:  [580 mL-600 mL] 600 mL PEEP:  [5 cmH20-15 cmH20] 15 cmH20 Plateau Pressure:  [19 cmH20-31 cmH20] 31 cmH20  INTAKE / OUTPUT: I/O last 3 completed shifts: In: 8146.5 [I.V.:8146.5] Out: 3425 [Urine:2365; Emesis/NG output:920; Blood:140]  PHYSICAL EXAMINATION: General:  Adult male, no distress  Neuro:  Sedated, does not follow commands HEENT:  S/P Crani, ETT in place  Cardiovascular:  RRR, no MRG, NI S1/S2 Lungs:  Bibasilar crackles, no wheeze, non-labored  Abdomen:  Obese, active bowel sounds  Musculoskeletal:  +2 edema throughout  Skin:  Warm, dry, intact   LABS:  BMET  Recent Labs Lab 12/02/16 1726  NA 137  K 3.8    Electrolytes No results for input(s): CALCIUM, MG, PHOS in the last 168 hours.  CBC  Recent Labs Lab 12/01/16 2343 12/02/16 1726  WBC 7.9  --   HGB 12.3* 10.2*  HCT 39.4 30.0*  PLT 280  --     Coag's  Recent Labs Lab 12/01/16 2343  APTT 29  INR 1.02    Sepsis Markers No results for  input(s): LATICACIDVEN, PROCALCITON, O2SATVEN in the last 168 hours.  ABG  Recent Labs Lab 12/02/16 1150 12/02/16 1726  PHART 7.206* 7.344*  PCO2ART 61.0* 46.6  PO2ART 65.0* 175.0*    Liver Enzymes No results for input(s): AST, ALT, ALKPHOS, BILITOT, ALBUMIN in the last 168 hours.  Cardiac Enzymes No results for input(s): TROPONINI, PROBNP in the last 168 hours.  Glucose  Recent Labs Lab 12/02/16 1944 12/02/16 2310 12/03/16 0332 12/03/16 0736  GLUCAP 127* 132* 125* 123*    Imaging Portable Chest Xray  Result Date: 12/03/2016 CLINICAL DATA:  Respiratory failure.  Hypoxia. EXAM: PORTABLE CHEST 1 VIEW COMPARISON:  12/02/2016 FINDINGS: Endotracheal tube in good position. Enteric catheter tip collimated off the image. The cardiac silhouette is enlarged. Mediastinal contours appear intact. Low lung volumes with prominence of the interstitium. More focal airspace consolidation the left lower lobe cannot be excluded. Osseous structures are without acute abnormality. Soft tissues are grossly normal. IMPRESSION: Low lung volumes with interstitial pulmonary edema. Left lower lobe airspace consolidation versus atelectasis/pleural effusion. Stably enlarged cardiac silhouette. Electronically Signed   By: Ted Mcalpine M.D.   On: 12/03/2016 07:27   Dg Chest Port 1 View  Result Date: 12/02/2016 CLINICAL DATA:  Intubation an orogastric tube placement. EXAM: PORTABLE CHEST 1 VIEW COMPARISON:  Chest x-ray report dated  November 22, 2015 FINDINGS: The endotracheal tube tip lies 3.6 cm above the carina. The esophagogastric tube tip lies in the gastric cardia having coiled first in the proximal gastric body. The lungs are hypoinflated. The pulmonary vascularity is engorged. The cardiac silhouette is enlarged. There is small right pleural effusion. IMPRESSION: The positioning of the endotracheal tube is good. The tip of the esophagogastric tube lies in the gastric cardia but there is ample tubing  within the stomach which may allow the tip to proceed more distally. Follow-up radiographs of the upper abdomen would be useful. Electronically Signed   By: David  [place]az[place]and M.D.   On: 12/02/2016 11:47   Dg Abd Portable 1v  Result Date: 12/02/2016 CLINICAL DATA:  Orogastric tube placement EXAM: PORTABLE ABDOMEN - 1 VIEW COMPARISON:  Portable exam 1106 hours without priors for comparison FINDINGS: Gastric tube coiled in proximal stomach. Visualized bowel gas pattern normal. No acute osseous findings. IMPRESSION: Gastric tube coiled in proximal stomach. Electronically Signed   By: Ulyses Southward M.D.   On: 12/02/2016 11:45    STUDIES:  CTA head 5/7 > diffuse SAH over both convexities and extending into the basal cisterns.  CULTURES: None.  ANTIBIOTICS: None.  SIGNIFICANT EVENTS: 5/7 > admit. 5/8 > PCCM consult > intubation > clipping of posterior communicating artery aneurysm   LINES/TUBES: ETT 5/8 >  DISCUSSION: 36 y.o. male admitted 5/7 with SAH.  Required intubation 5/8 due to hypoxemia and increased WOB.  ASSESSMENT / PLAN:  NEUROLOGIC A:   Acute encephalopathy - due to sedation. 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 Wean  Fentanyl gtt to Achieve RASS D/C Propofol  PRN versed  Daily WUA. Neurosurgery following  PT/OT when able  Continue empiric nimodipine x 21 days.  PULMONARY A: Acute Hypoxic Respiratory failure  P:   Vent Support  Wean as tolerated > on 70% FiO2 and 15 PEEP 5/9  VAP bundle  Trend CXR  CARDIOVASCULAR A:  HTN P:  Cardiac Monitoring  Continue Cardene for systolic goal <180  RENAL A:   Volume Overload  P:   D/C NS @ 75 ml/hr  Trend BMP Replace electrolytes as needed  Cautious with Dieresis as to prevent vasospasm   GASTROINTESTINAL A:   Morbid Obesity  Nutrition  GI prophylaxis  P:   PPI NPO Start TF   HEMATOLOGIC A:   VTE Prophylaxis  P:  SCDs Trend CBC   INFECTIOUS A:   No issues  P:    Trend WBC and Fever Curve   ENDOCRINE A:   No issues    P:   Trend Glucose    FAMILY  - Updates: no family at bedside   - Inter-disciplinary family meet or Palliative Care meeting due by: 12/09/2016   CC Time: 32 minutes   [name], AGAC-NP [place] [facility]  Pgr: [phone]  PCCM Pgr: [phone]  Attending Note:  36 year old male that is super morbidly obese presenting to [facility] with SAH.  Failed intervention on 5/8 due to inability to access artery and was converted to an open crani now sedated and intubated with clear lungs on exam.  I reviewed CXR myself, ETT in good position.  Will continue propfol for today but will need to change in AM to precedex or versed, continue fentanyl, place PICC line, consult nutrition for TF and continue tele monitoring.  Patient is very fluid overloaded and now with difficulty oxygenating, will d/c  IVF but no diureses given SAH and aneurysm, may need to consider a peripheral CCB for spasm, only done due to difficulty oxygenating.  Mother updated at length bedside.  The patient is critically ill with multiple organ systems failure and requires high complexity decision making for assessment and support, frequent evaluation and titration of therapies, application of advanced monitoring technologies and extensive interpretation of multiple databases.   Critical Care Time devoted to patient care services described in this note is  35  Minutes. This time reflects time of care of this signee Dr Koren BoundWesam Yacoub. This critical care time does not reflect procedure time, or teaching time or supervisory time of PA/NP/Med student/Med Resident etc but could involve care discussion time.  Alyson ReedyWesam G. Yacoub, M.D. [facility] Medicine. Pager: [phone]. After hours pager: [phone].

## 2016-12-03 NOTE — Progress Notes (Signed)
Peripherally Inserted Central Catheter/Midline Placement  The IV Nurse has discussed with the patient and/or persons authorized to consent for the patient, the purpose of this procedure and the potential benefits and risks involved with this procedure.  The benefits include less needle sticks, lab draws from the catheter, and the patient may be discharged home with the catheter. Risks include, but not limited to, infection, bleeding, blood clot (thrombus formation), and puncture of an artery; nerve damage and irregular heartbeat and possibility to perform a PICC exchange if needed/ordered by physician.  Alternatives to this procedure were also discussed.  Bard Power PICC patient education guide, fact sheet on infection prevention and patient information card has been provided to patient /or left at bedside.    PICC/Midline Placement Documentation        Lisabeth DevoidGibbs, Sanchez Hemmer Jeanette 12/03/2016, 12:53 PM Consent obtained by staff RN from mother at bedside

## 2016-12-03 NOTE — Progress Notes (Signed)
OT Cancellation Note  Patient Details Name: Bryan [name] MRN: [id number] DOB: Nov 02, 1980   Cancelled Treatment:    Reason Eval/Treat Not Completed: Patient not medically ready  [facility] Hospital[facility]ILLARY  Con Arganbright, OT/L  [phone] 12/03/2016 12/03/2016, 7:31 AM

## 2016-12-04 ENCOUNTER — Other Ambulatory Visit (HOSPITAL_COMMUNITY): Payer: 59

## 2016-12-04 ENCOUNTER — Inpatient Hospital Stay (HOSPITAL_COMMUNITY): Payer: BLUE CROSS/BLUE SHIELD

## 2016-12-04 DIAGNOSIS — G9340 Encephalopathy, unspecified: Secondary | ICD-10-CM

## 2016-12-04 DIAGNOSIS — I10 Essential (primary) hypertension: Secondary | ICD-10-CM

## 2016-12-04 DIAGNOSIS — J9601 Acute respiratory failure with hypoxia: Secondary | ICD-10-CM

## 2016-12-04 DIAGNOSIS — I609 Nontraumatic subarachnoid hemorrhage, unspecified: Secondary | ICD-10-CM

## 2016-12-04 LAB — BASIC METABOLIC PANEL
ANION GAP: 5 (ref 5–15)
Anion gap: 8 (ref 5–15)
Anion gap: 8 (ref 5–15)
Anion gap: 8 (ref 5–15)
BUN: 35 mg/dL — AB (ref 6–20)
BUN: 36 mg/dL — ABNORMAL HIGH (ref 6–20)
BUN: 40 mg/dL — ABNORMAL HIGH (ref 6–20)
BUN: 45 mg/dL — ABNORMAL HIGH (ref 6–20)
CALCIUM: 8.7 mg/dL — AB (ref 8.9–10.3)
CHLORIDE: 113 mmol/L — AB (ref 101–111)
CHLORIDE: 111 mmol/L (ref 101–111)
CHLORIDE: 111 mmol/L (ref 101–111)
CO2: 21 mmol/L — ABNORMAL LOW (ref 22–32)
CO2: 22 mmol/L (ref 22–32)
CO2: 20 mmol/L — ABNORMAL LOW (ref 22–32)
CO2: 22 mmol/L (ref 22–32)
CREATININE: 1.46 mg/dL — AB (ref 0.61–1.24)
CREATININE: 1.69 mg/dL — AB (ref 0.61–1.24)
CREATININE: 1.95 mg/dL — AB (ref 0.61–1.24)
Calcium: 8.2 mg/dL — ABNORMAL LOW (ref 8.9–10.3)
Calcium: 8.1 mg/dL — ABNORMAL LOW (ref 8.9–10.3)
Calcium: 8.3 mg/dL — ABNORMAL LOW (ref 8.9–10.3)
Chloride: 110 mmol/L (ref 101–111)
Creatinine, Ser: 1.47 mg/dL — ABNORMAL HIGH (ref 0.61–1.24)
GFR calc Af Amer: >60 mL/min (ref >60)
GFR calc non Af Amer: >60 mL/min (ref >60)
GFR calc non Af Amer: 51 mL/min — ABNORMAL LOW (ref >60)
GFR calc non Af Amer: 43 mL/min — ABNORMAL LOW (ref >60)
GFR, EST AFRICAN AMERICAN: 59 mL/min — AB (ref >60)
GFR, EST AFRICAN AMERICAN: 50 mL/min — AB (ref >60)
GLUCOSE: 132 mg/dL — AB (ref 65–99)
Glucose, Bld: 132 mg/dL — ABNORMAL HIGH (ref 65–99)
Glucose, Bld: 143 mg/dL — ABNORMAL HIGH (ref 65–99)
Glucose, Bld: 131 mg/dL — ABNORMAL HIGH (ref 65–99)
POTASSIUM: 3.8 mmol/L (ref 3.5–5.1)
Potassium: 3.8 mmol/L (ref 3.5–5.1)
Potassium: 3.6 mmol/L (ref 3.5–5.1)
Potassium: 3.6 mmol/L (ref 3.5–5.1)
SODIUM: 140 mmol/L (ref 135–145)
SODIUM: 139 mmol/L (ref 135–145)
SODIUM: 141 mmol/L (ref 135–145)
Sodium: 139 mmol/L (ref 135–145)

## 2016-12-04 LAB — POCT I-STAT 3, ART BLOOD GAS (G3+)
ACID-BASE DEFICIT: 4 mmol/L — AB (ref 0.0–2.0)
Bicarbonate: 22 mmol/L (ref 20.0–28.0)
O2 Saturation: 88 %
PCO2 ART: 45.6 mmHg (ref 32.0–48.0)
PH ART: 7.298 — AB (ref 7.350–7.450)
PO2 ART: 66 mmHg — AB (ref 83.0–108.0)
Patient temperature: 101.4
TCO2: 23 mmol/L (ref 0–100)

## 2016-12-04 LAB — CBC
HEMATOCRIT: 32.1 % — AB (ref 39.0–52.0)
Hemoglobin: 9.7 g/dL — ABNORMAL LOW (ref 13.0–17.0)
MCH: 25.9 pg — ABNORMAL LOW (ref 26.0–34.0)
MCHC: 30.2 g/dL (ref 30.0–36.0)
MCV: 85.6 fL (ref 78.0–100.0)
Platelets: 274 10*3/uL (ref 150–400)
RBC: 3.75 MIL/uL — ABNORMAL LOW (ref 4.22–5.81)
RDW: 14.3 % (ref 11.5–15.5)
WBC: 13.2 10*3/uL — ABNORMAL HIGH (ref 4.0–10.5)

## 2016-12-04 LAB — PHOSPHORUS
Phosphorus: 3.5 mg/dL (ref 2.5–4.6)
Phosphorus: 3.1 mg/dL (ref 2.5–4.6)

## 2016-12-04 LAB — GLUCOSE, CAPILLARY
GLUCOSE-CAPILLARY: 120 mg/dL — AB (ref 65–99)
Glucose-Capillary: 109 mg/dL — ABNORMAL HIGH (ref 65–99)
Glucose-Capillary: 118 mg/dL — ABNORMAL HIGH (ref 65–99)
Glucose-Capillary: 123 mg/dL — ABNORMAL HIGH (ref 65–99)
Glucose-Capillary: 142 mg/dL — ABNORMAL HIGH (ref 65–99)
Glucose-Capillary: 92 mg/dL (ref 65–99)

## 2016-12-04 LAB — MAGNESIUM
Magnesium: 2.1 mg/dL (ref 1.7–2.4)
Magnesium: 2.2 mg/dL (ref 1.7–2.4)

## 2016-12-04 MED ORDER — DEXMEDETOMIDINE HCL IN NACL 400 MCG/100ML IV SOLN
0.4000 ug/kg/h | INTRAVENOUS | Status: DC
  Administered 2016-12-04: 0.8 ug/kg/h via INTRAVENOUS
  Administered 2016-12-04 (×4): 0.6 ug/kg/h via INTRAVENOUS
  Filled 2016-12-04 (×6): qty 100

## 2016-12-04 MED ORDER — FUROSEMIDE 10 MG/ML IJ SOLN
40.0000 mg | Freq: Four times a day (QID) | INTRAMUSCULAR | Status: DC
  Administered 2016-12-04: 40 mg via INTRAVENOUS
  Filled 2016-12-04: qty 4

## 2016-12-04 MED ORDER — METOLAZONE 10 MG PO TABS
10.0000 mg | ORAL_TABLET | Freq: Once | ORAL | Status: AC
  Administered 2016-12-04: 10 mg via ORAL
  Filled 2016-12-04: qty 1

## 2016-12-04 MED ORDER — POTASSIUM CHLORIDE 20 MEQ/15ML (10%) PO SOLN
20.0000 meq | Freq: Once | ORAL | Status: AC
  Administered 2016-12-04: 20 meq via ORAL
  Filled 2016-12-04: qty 15

## 2016-12-04 MED ORDER — DEXMEDETOMIDINE HCL 200 MCG/2ML IV SOLN
0.4000 ug/kg/h | INTRAVENOUS | Status: DC
  Administered 2016-12-04: 0.4 ug/kg/h via INTRAVENOUS
  Filled 2016-12-04: qty 2

## 2016-12-04 MED ORDER — FUROSEMIDE 10 MG/ML IJ SOLN
8.0000 mg/h | INTRAVENOUS | Status: DC
  Administered 2016-12-04: 8 mg/h via INTRAVENOUS
  Filled 2016-12-04: qty 25

## 2016-12-04 NOTE — Progress Notes (Signed)
Pt seen and examined. No issues overnight. Pt requiring increased O2 sat.  EXAM: Temp:  [98.7 F (37.1 C)-101.8 F (38.8 C)] 101.8 F (38.8 C) (05/10 1147) Pulse Rate:  [62-98] 72 (05/10 1200) Resp:  [17-28] 25 (05/10 1200) BP: (102-158)/(46-79) 103/56 (05/10 1200) SpO2:  [88 %-100 %] 95 % (05/10 1214) FiO2 (%):  [60 %-90 %] 80 % (05/10 1214) Weight:  [478 lb (216.8 kg)-484 lb (219.5 kg)] 484 lb (219.5 kg) (05/10 0459) Intake/Output      05/10 0730 - 05/11 0729   I.V. (mL/kg)    NG/GT    Total Intake(mL/kg)    Urine (mL/kg/hr) 275 (0.3)   Total Output 275   Net -275         Per RN when precedex lowered, Opens eyes to voice Breathes spontaneously MAE to command  LABS: Lab Results  Component Value Date   CREATININE 1.46 (H) 12/04/2016   BUN 36 (H) 12/04/2016   NA 140 12/04/2016   K 3.8 12/04/2016   CL 113 (H) 12/04/2016   CO2 22 12/04/2016   Lab Results  Component Value Date   WBC 13.2 (H) 12/04/2016   HGB 9.7 (L) 12/04/2016   HCT 32.1 (L) 12/04/2016   MCV 85.6 12/04/2016   PLT 274 12/04/2016    IMPRESSION: - 36 y.o. male SAH d# 3 s/p clipping left Pcom, neurologically appears stable.  - VDRF  PLAN: - Vent mgmt per PCCM. Would need SBP between 100 and 160mmHg. If precedex and fentanyl are titrated down to lowest dose to prevent coughing/discomfort and vent synchrony and pressure remains low, would start pressor.

## 2016-12-04 NOTE — Progress Notes (Addendum)
PT Cancellation Note  Patient Details Name: Bryan KaysDarren D Gelber MRN: [id number] DOB: April 24, 1981   Cancelled Treatment:    Reason Eval/Treat Not Completed: Patient not medically ready; remains on vent with sedation.  RN reports likely to undergo trach/PEG at some point.  Will sign off for now and await new order when appropriate.   Elray McgregorCynthia Jahid Weida 12/04/2016, 4:46 PM  Sheran Lawlessyndi Amaryah Mallen, PT [phone] 12/04/2016

## 2016-12-04 NOTE — Progress Notes (Signed)
SLP Cancellation Note  Patient Details Name: Bryan [name] MRN: [id number] DOB: 1980-11-09   Cancelled treatment:       Reason Eval/Treat Not Completed: Medical issues which prohibited therapy   Maxcine Hamaiewonsky, Anett Ranker 12/04/2016, 8:46 AM   Maxcine HamLaura Paiewonsky, M.A. CCC-SLP [phone]

## 2016-12-04 NOTE — Progress Notes (Signed)
PULMONARY / CRITICAL CARE MEDICINE   Name: Bryan [name] MRN: [id number] DOB: 06/03/1981    ADMISSION DATE:  12/01/2016 CONSULTATION DATE:  12/02/2016  REFERRING MD:  Dr. Patric Dykes   CHIEF COMPLAINT:  SAH  Brief:   36 y.o.malewith PMH of morbid obesity, varicose veins who presented to [facility] ED 12/01/16 with sudden onset of headache that started earlier that morning. CTA demonstrated diffuse SAH over both convexities and extending into the basal cisterns. Admitted by neurosurgery with plans for angiogram 5/8 with possible clipping.   5/8 had worsening hypoxemia and increased WOB requiring intubation.   SUBJECTIVE:  Changed from Precedex to Propofol overnight due to bradycardia. Fi02 decreased to 50%, Tmax 103.1   VITAL SIGNS: BP 114/63   Pulse 62   Temp (!) 101.8 F (38.8 C) (Axillary)   Resp (!) 28   Ht 5\' 11"  (1.803 m)   Wt (!) 219.5 kg (484 lb)   SpO2 96%   BMI 67.50 kg/m   HEMODYNAMICS:    VENTILATOR SETTINGS: Vent Mode: PRVC FiO2 (%):  [60 %-90 %] 70 % Set Rate:  [28 bmp] 28 bmp Vt Set:  [600 mL] 600 mL PEEP:  [14 cmH20-16 cmH20] 14 cmH20 Plateau Pressure:  [32 cmH20-38 cmH20] 32 cmH20  INTAKE / OUTPUT: I/O last 3 completed shifts: In: 8924.4 [I.V.:8061.1; NG/GT:863.3] Out: 2995 [Urine:2725; Emesis/NG output:270]  PHYSICAL EXAMINATION: General:  Adult male, no distress Neuro:  Sedated, >per nursing when sedation off moved extremities purposefully, pupils intact  HEENT:  ETT in place, Surgical site incision noted to scalp  Cardiovascular:  RRR, no MRG, NI S1/S2 Lungs:  Crackles noted to bases, no wheeze, distant breath sounds, non-labored  Abdomen:  Obese, active bowel sounds  Musculoskeletal:  +1 edema to lower extremities  Skin:  Warm, dry   LABS:  BMET  Recent Labs Lab 12/03/16 1158 12/04/16 0521 12/04/16 1029  NA 136 139 140  K 3.6 3.8 3.8  CL 108 110 113*  CO2 19* 21* 22  BUN 20 35* 36*  CREATININE 1.37* 1.47* 1.46*  GLUCOSE 123* 132* 132*     Electrolytes  Recent Labs Lab 12/03/16 0938 12/03/16 1158 12/04/16 0521 12/04/16 1029  CALCIUM  --  8.2* 8.2* 8.1*  MG 1.9  --  2.1  --   PHOS 2.7  --  3.5  --     CBC  Recent Labs Lab 12/01/16 2343 12/02/16 1726 12/03/16 1158 12/04/16 0521  WBC 7.9  --  12.8* 13.2*  HGB 12.3* 10.2* 10.6* 9.7*  HCT 39.4 30.0* 33.6* 32.1*  PLT 280  --  290 274    Coag's  Recent Labs Lab 12/01/16 2343  APTT 29  INR 1.02    Sepsis Markers No results for input(s): LATICACIDVEN, PROCALCITON, O2SATVEN in the last 168 hours.  ABG  Recent Labs Lab 12/02/16 1150 12/02/16 1726 12/04/16 0837  PHART 7.206* 7.344* 7.298*  PCO2ART 61.0* 46.6 45.6  PO2ART 65.0* 175.0* 66.0*    Liver Enzymes No results for input(s): AST, ALT, ALKPHOS, BILITOT, ALBUMIN in the last 168 hours.  Cardiac Enzymes No results for input(s): TROPONINI, PROBNP in the last 168 hours.  Glucose  Recent Labs Lab 12/03/16 1532 12/03/16 1934 12/03/16 2333 12/04/16 0357 12/04/16 0721 12/04/16 1123  GLUCAP 116* 119* 122* 123* 109* 120*    Imaging Dg Chest Port 1 View  Result Date: 12/04/2016 CLINICAL DATA:  Ventilator dependent. EXAM: PORTABLE CHEST 1 VIEW COMPARISON:  12/03/2016 FINDINGS: Endotracheal tube terminates at the level of  the clavicles, unchanged. A right PICC has been placed and likely terminates over the lower SVC/ cavoatrial junction region, although the tip is poorly visualized due to underpenetration. Enteric tube courses into the left upper abdomen with tip not imaged. The cardiac silhouette remains enlarged. Lung volumes are diminished with mild worsening of perihilar and bibasilar airspace opacities. There may be a small left pleural effusion. No pneumothorax is seen. IMPRESSION: 1. Interval right PICC placement as above. 2. Cardiomegaly with increasing bilateral airspace opacities which may reflect edema and atelectasis. Electronically Signed   By: Sebastian AcheAllen  Grady M.D.   On: 12/04/2016  07:54    STUDIES: CTA head 5/7 > diffuse SAH over both convexities and extending into the basal cisterns. CXR 5/10 > Cardiomegaly with increasing bilateral airspace opacities which may reflect edema/atelectasis  ECHO 5/10 >>  CULTURES: MRSA 5/8 > Negative  Blood 5/11 >>   ANTIBIOTICS: None.  SIGNIFICANT EVENTS: 5/7 > admit. 5/8 > PCCM consult > intubation >clipping of posterior communicating artery aneurysm   LINES/TUBES: ETT 5/8 >  DISCUSSION: 36 y.o.maleadmitted 5/7 with SAH. Required intubation 5/8 due to hypoxemia and increased WOB.   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  P:   RASS goal: -1/-2 Per Neurosurgery  Continue nimodipine x 21 days  Wean Fentanyl and Propofol gtt to achieve RASS Daily WUA as able  PT/OT when able   PULMONARY A: Acute Hypoxic Respiratory Failure +Pulmonary Edema P:   Vent Support  Wean as tolerated > Currently 50% and 8 PEEP VAP bundle Trend CXR  CARDIOVASCULAR A:  HTN P:  Cardiac Monitoring  Maintain A-Line  Systolic Goal 100-160  PRN Hydralazine for systolic >160  RENAL A:   Acute Kidney Injury (Base Crt 0.89) Hypokalemia  Volume Overload -5L Output 5/10, Remains 3.8L + as of 5/11  P:   Continue Lasix gtt > will readdress this afternoon  Trend BMP Replace electrolytes as needed   GASTROINTESTINAL A:   Morbid Obesity  Nutrition  GI Prophylaxis  P:   PPI TF  HEMATOLOGIC A:   VTE Prophylaxis  P:  SCDs Trend CBC  INFECTIOUS A:   Leukocytosis  Febrile 103.1 5/10 overnight  P:   Trend WBC and Fever Curve  Send Blood Cultures   ENDOCRINE A:   No issues    P:   Trend Glucose   FAMILY  - Updates: no family at bedside   - Inter-disciplinary family meet or Palliative Care meeting due by: 5/15   CC Time: 34 minutes   Jovita KussmaulKatalina Eubanks, AGAC-NP [place] [facility]  Pgr: [phone]  PCCM  Pgr: [phone]  Attending Note:  36 year old male with super morbid obesity who is intubated for pulmonary edema.  Patient was on 90% and PEEP of 16 yesterday, diuresed via lasix drip and down to 50% and 8 of PEEP.  On exam, distant BS.  I reviewed CXR myself, persistent pulmonary edema.  Discussed with PCCM-NP.  Febrile overnight.  Check PCT now, if elevated then will treat with broad spectrum, if negative then no antibiotics needed.  Fever could be a response to surgery or SAH.  Will continue to diurese and monitor neuro status.  Will hold weaning trials til AM.  The patient is critically ill with multiple organ systems failure and requires high complexity decision making for assessment and support, frequent evaluation and titration of therapies, application of advanced monitoring technologies and  extensive interpretation of multiple databases.   Critical Care Time devoted to patient care services described in this note is  35  Minutes. This time reflects time of care of this signee Dr Jennet Maduro. This critical care time does not reflect procedure time, or teaching time or supervisory time of PA/NP/Med student/Med Resident etc but could involve care discussion time.  Rush Farmer, M.D. [facility] Pulmonary/Critical Care Medicine. Pager: [phone]. After hours pager: [phone].

## 2016-12-04 NOTE — Progress Notes (Signed)
OT Cancellation Note  Patient Details Name: Bryan [name] MRN: [id number] DOB: 11-11-1980   Cancelled Treatment:    Reason Eval/Treat Not Completed: Patient not medically ready. Pt remains sedated and intubated post-procedure. Pt also with active bedrest orders. Will await increase in activity orders prior to initiating OT evaluation. Thank you for this referral!  Doristine Sectionharity A Cythina Mickelsen, MS OTR/L  Pager: [phone]    [facility] Medical CenterCharity A Azariya Freeman 12/04/2016, 7:27 AM

## 2016-12-04 NOTE — Progress Notes (Signed)
PULMONARY / CRITICAL CARE MEDICINE   Name: Bryan [name] MRN: [id number] DOB: 1981/01/28    ADMISSION DATE:  12/01/2016 CONSULTATION DATE:  12/02/2016  REFERRING MD:  Dr. Patric Dykes   CHIEF COMPLAINT:  SAH  Brief:   36 y.o.malewith PMH of morbid obesity, varicose veins who presented to [facility] ED 12/01/16 with sudden onset of headache that started earlier that morning. CTA demonstrated diffuse SAH over both convexities and extending into the basal cisterns. Admitted by neurosurgery with plans for angiogram 5/8 with possible clipping.   5/8 had worsening hypoxemia and increased WOB requiring intubation.   SUBJECTIVE:  This morning required increased in Fi02 to 90%.  VITAL SIGNS: BP 139/75   Pulse 89   Temp 99.7 F (37.6 C) (Oral)   Resp (!) 28   Ht 5\' 11"  (1.803 m)   Wt (!) 219.5 kg (484 lb)   SpO2 92%   BMI 67.50 kg/m   HEMODYNAMICS:    VENTILATOR SETTINGS: Vent Mode: PRVC FiO2 (%):  [60 %-90 %] 90 % Set Rate:  [28 bmp] 28 bmp Vt Set:  [600 mL] 600 mL PEEP:  [15 cmH20-16 cmH20] 16 cmH20 Plateau Pressure:  [32 cmH20-38 cmH20] 32 cmH20  INTAKE / OUTPUT: I/O last 3 completed shifts: In: 8924.4 [I.V.:8061.1; NG/GT:863.3] Out: 2995 [Urine:2725; Emesis/NG output:270]  PHYSICAL EXAMINATION: General:  Adult male, no distress  Neuro:  Sedated, moves all extremities, pupils intact  HEENT:  ETT in place, surgical incision noted  Cardiovascular:  RRR, no MRG, NI S1/S2 Lungs:  Bibasilar crackles, coarse throughout, no wheeze    Abdomen:  Obese, active bowel sounds Musculoskeletal:  +2-3 edema noted throughout  Skin:  Warm, dry, intact   LABS:  BMET  Recent Labs Lab 12/02/16 1726 12/03/16 1158 12/04/16 0521  NA 137 136 139  K 3.8 3.6 3.8  CL  --  108 110  CO2  --  19* 21*  BUN  --  20 35*  CREATININE  --  1.37* 1.47*  GLUCOSE  --  123* 132*    Electrolytes  Recent Labs Lab 12/03/16 0938 12/03/16 1158 12/04/16 0521  CALCIUM  --  8.2* 8.2*  MG 1.9  --  2.1   PHOS 2.7  --  3.5    CBC  Recent Labs Lab 12/01/16 2343 12/02/16 1726 12/03/16 1158 12/04/16 0521  WBC 7.9  --  12.8* 13.2*  HGB 12.3* 10.2* 10.6* 9.7*  HCT 39.4 30.0* 33.6* 32.1*  PLT 280  --  290 274    Coag's  Recent Labs Lab 12/01/16 2343  APTT 29  INR 1.02    Sepsis Markers No results for input(s): LATICACIDVEN, PROCALCITON, O2SATVEN in the last 168 hours.  ABG  Recent Labs Lab 12/02/16 1150 12/02/16 1726  PHART 7.206* 7.344*  PCO2ART 61.0* 46.6  PO2ART 65.0* 175.0*    Liver Enzymes No results for input(s): AST, ALT, ALKPHOS, BILITOT, ALBUMIN in the last 168 hours.  Cardiac Enzymes No results for input(s): TROPONINI, PROBNP in the last 168 hours.  Glucose  Recent Labs Lab 12/03/16 1133 12/03/16 1532 12/03/16 1934 12/03/16 2333 12/04/16 0357 12/04/16 0721  GLUCAP 117* 116* 119* 122* 123* 109*    Imaging Dg Chest Port 1 View  Result Date: 12/04/2016 CLINICAL DATA:  Ventilator dependent. EXAM: PORTABLE CHEST 1 VIEW COMPARISON:  12/03/2016 FINDINGS: Endotracheal tube terminates at the level of the clavicles, unchanged. A right PICC has been placed and likely terminates over the lower SVC/ cavoatrial junction region, although the tip is  poorly visualized due to underpenetration. Enteric tube courses into the left upper abdomen with tip not imaged. The cardiac silhouette remains enlarged. Lung volumes are diminished with mild worsening of perihilar and bibasilar airspace opacities. There may be a small left pleural effusion. No pneumothorax is seen. IMPRESSION: 1. Interval right PICC placement as above. 2. Cardiomegaly with increasing bilateral airspace opacities which may reflect edema and atelectasis. Electronically Signed   By: Sebastian AcheAllen  Grady M.D.   On: 12/04/2016 07:54    STUDIES: CTA head 5/7 > diffuse SAH over both convexities and extending into the basal cisterns. CXR 5/10 > Cardiomegaly with increasing bilateral airspace opacities which may  reflect edema/atelectasis  ECHO 5/10 >>  CULTURES: MRSA 5/8 > Negative   ANTIBIOTICS: None.  SIGNIFICANT EVENTS: 5/7 > admit. 5/8 > PCCM consult > intubation > clipping of posterior communicating artery aneurysm   LINES/TUBES: ETT 5/8 >  DISCUSSION: 36 y.o.maleadmitted 5/7 with SAH. Required intubation 5/8 due to hypoxemia and increased WOB.  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  P:   RASS goal: -1/-2 Per Neurosurgery  Continue nimodipine x 21 days Wean Fentanyl Gtt to achieve RASS  PRN Versed  Daily WUA as able (This morning on 90% Fi02 and 16 PEEP) PT/OT when able    PULMONARY A: Acute Hypoxic Respiratory Failure +Pulmonary Edema  P:   Vent Support  Wean as tolerated (90% Fi02 and 16 PEEP) VAP Bundle  Trend CXR ABG now   CARDIOVASCULAR A:  HTN P:  Cardiac Monitoring  Maintain A-Line  Wean Cardene for systolic <160 ECHO pending   RENAL A:   Acute Kidney Injury (Base Crt 0.89) Volume Overload  P:   Trend BMP Replace electrolytes as needed  Lasix 40 q6h > spoke with neurosurgery okay with this and plan to keep patient normotensive   GASTROINTESTINAL A:   Morbid Obesity  Nutrition  GI Prophylaxis  P:   PPI TF  HEMATOLOGIC A:   VTE Prophylaxis  P:  SCDs Trend CBC  INFECTIOUS A:   No issues  P:   Trend WBC and Fever Curve   ENDOCRINE A:   No issues    P:   Trend Glucose     FAMILY  - Updates: No family at beside   - Inter-disciplinary family meet or Palliative Care meeting due by:  5/15   CC Time: 45 minutes  Jovita KussmaulKatalina Eubanks, AGAC-NP [place] [facility]  Pgr: [phone]  PCCM Pgr: [phone]  Attending Note:  36 year old male that is super morbidly obese with diastolic heart failure that suffered a SAH and s/p crani for clipping of aneurysm who remains intubated.  Severely fluid overloaded on exam.  I  reviewed CXR myself, ETT in good position, cardiomegally noted.  Will actively diurese today with a lasix drip and zaroxolyn.  Replace electrolytes.  Hold weaning.  Titrate O2 and PEEP as able.  Maintain BP as ordered with cardene.  D/C propofol and start precedex.  Mother updated bedside at length.  Discussed with RT and bedside RN.  The patient is critically ill with multiple organ systems failure and requires high complexity decision making for assessment and support, frequent evaluation and titration of therapies, application of advanced monitoring technologies and extensive interpretation of multiple databases.   Critical Care Time devoted to patient care services described in this note is  35  Minutes. This time reflects time of care  of this signee Dr Jennet Maduro. This critical care time does not reflect procedure time, or teaching time or supervisory time of PA/NP/Med student/Med Resident etc but could involve care discussion time.  Rush Farmer, M.D. [facility] Pulmonary/Critical Care Medicine. Pager: [phone]. After hours pager: [phone].

## 2016-12-05 ENCOUNTER — Inpatient Hospital Stay (HOSPITAL_COMMUNITY): Payer: BLUE CROSS/BLUE SHIELD

## 2016-12-05 DIAGNOSIS — I609 Nontraumatic subarachnoid hemorrhage, unspecified: Secondary | ICD-10-CM

## 2016-12-05 DIAGNOSIS — E876 Hypokalemia: Secondary | ICD-10-CM

## 2016-12-05 LAB — BASIC METABOLIC PANEL
ANION GAP: 10 (ref 5–15)
ANION GAP: 7 (ref 5–15)
ANION GAP: 9 (ref 5–15)
Anion gap: 12 (ref 5–15)
BUN: 47 mg/dL — AB (ref 6–20)
BUN: 48 mg/dL — ABNORMAL HIGH (ref 6–20)
BUN: 41 mg/dL — ABNORMAL HIGH (ref 6–20)
BUN: 40 mg/dL — ABNORMAL HIGH (ref 6–20)
CO2: 21 mmol/L — ABNORMAL LOW (ref 22–32)
CO2: 24 mmol/L (ref 22–32)
CO2: 24 mmol/L (ref 22–32)
CO2: 25 mmol/L (ref 22–32)
CREATININE: 1.69 mg/dL — AB (ref 0.61–1.24)
Calcium: 8.8 mg/dL — ABNORMAL LOW (ref 8.9–10.3)
Calcium: 8.9 mg/dL (ref 8.9–10.3)
Calcium: 8.6 mg/dL — ABNORMAL LOW (ref 8.9–10.3)
Calcium: 9 mg/dL (ref 8.9–10.3)
Chloride: 106 mmol/L (ref 101–111)
Chloride: 106 mmol/L (ref 101–111)
Chloride: 107 mmol/L (ref 101–111)
Chloride: 107 mmol/L (ref 101–111)
Creatinine, Ser: 1.67 mg/dL — ABNORMAL HIGH (ref 0.61–1.24)
Creatinine, Ser: 1.47 mg/dL — ABNORMAL HIGH (ref 0.61–1.24)
Creatinine, Ser: 1.36 mg/dL — ABNORMAL HIGH (ref 0.61–1.24)
GFR calc Af Amer: 59 mL/min — ABNORMAL LOW (ref >60)
GFR calc Af Amer: 60 mL/min — ABNORMAL LOW (ref >60)
GFR calc Af Amer: >60 mL/min (ref >60)
GFR calc Af Amer: >60 mL/min (ref >60)
GFR calc non Af Amer: 52 mL/min — ABNORMAL LOW (ref >60)
GFR calc non Af Amer: >60 mL/min (ref >60)
GFR, EST NON AFRICAN AMERICAN: 51 mL/min — AB (ref >60)
GLUCOSE: 151 mg/dL — AB (ref 65–99)
GLUCOSE: 154 mg/dL — AB (ref 65–99)
GLUCOSE: 175 mg/dL — AB (ref 65–99)
Glucose, Bld: 107 mg/dL — ABNORMAL HIGH (ref 65–99)
POTASSIUM: 3.4 mmol/L — AB (ref 3.5–5.1)
POTASSIUM: 3.3 mmol/L — AB (ref 3.5–5.1)
POTASSIUM: 3.1 mmol/L — AB (ref 3.5–5.1)
Potassium: 3.6 mmol/L (ref 3.5–5.1)
Sodium: 139 mmol/L (ref 135–145)
Sodium: 140 mmol/L (ref 135–145)
Sodium: 138 mmol/L (ref 135–145)
Sodium: 141 mmol/L (ref 135–145)

## 2016-12-05 LAB — GLUCOSE, CAPILLARY
GLUCOSE-CAPILLARY: 117 mg/dL — AB (ref 65–99)
GLUCOSE-CAPILLARY: 127 mg/dL — AB (ref 65–99)
Glucose-Capillary: 88 mg/dL (ref 65–99)
Glucose-Capillary: 108 mg/dL — ABNORMAL HIGH (ref 65–99)
Glucose-Capillary: 144 mg/dL — ABNORMAL HIGH (ref 65–99)
Glucose-Capillary: 155 mg/dL — ABNORMAL HIGH (ref 65–99)

## 2016-12-05 LAB — CBC
HEMATOCRIT: 35.1 % — AB (ref 39.0–52.0)
HEMOGLOBIN: 10.8 g/dL — AB (ref 13.0–17.0)
MCH: 26.4 pg (ref 26.0–34.0)
MCHC: 30.8 g/dL (ref 30.0–36.0)
MCV: 85.8 fL (ref 78.0–100.0)
Platelets: 265 10*3/uL (ref 150–400)
RBC: 4.09 MIL/uL — ABNORMAL LOW (ref 4.22–5.81)
RDW: 14.5 % (ref 11.5–15.5)
WBC: 15.1 10*3/uL — ABNORMAL HIGH (ref 4.0–10.5)

## 2016-12-05 LAB — TRIGLYCERIDES: TRIGLYCERIDES: 120 mg/dL (ref <150)

## 2016-12-05 LAB — MAGNESIUM: Magnesium: 2.4 mg/dL (ref 1.7–2.4)

## 2016-12-05 LAB — PHOSPHORUS: PHOSPHORUS: 4.5 mg/dL (ref 2.5–4.6)

## 2016-12-05 LAB — PROCALCITONIN: PROCALCITONIN: 0.36 ng/mL

## 2016-12-05 MED ORDER — MIDAZOLAM HCL 2 MG/2ML IJ SOLN
2.0000 mg | INTRAMUSCULAR | Status: DC | PRN
  Administered 2016-12-05: 2 mg via INTRAVENOUS
  Filled 2016-12-05: qty 2

## 2016-12-05 MED ORDER — SODIUM CHLORIDE 0.9 % IV SOLN
0.5000 mg/h | INTRAVENOUS | Status: DC
  Administered 2016-12-05: 5 mg/h via INTRAVENOUS
  Administered 2016-12-05: 0.5 mg/h via INTRAVENOUS
  Administered 2016-12-06 – 2016-12-07 (×4): 5 mg/h via INTRAVENOUS
  Administered 2016-12-08 – 2016-12-09 (×3): 4 mg/h via INTRAVENOUS
  Filled 2016-12-05 (×12): qty 10

## 2016-12-05 MED ORDER — NICARDIPINE HCL IN NACL 40-0.83 MG/200ML-% IV SOLN
3.0000 mg/h | INTRAVENOUS | Status: DC
  Administered 2016-12-05: 15 mg/h via INTRAVENOUS
  Administered 2016-12-06: 7.5 mg/h via INTRAVENOUS
  Administered 2016-12-06 (×2): 12.5 mg/h via INTRAVENOUS
  Administered 2016-12-06 (×2): 10 mg/h via INTRAVENOUS
  Administered 2016-12-06 – 2016-12-07 (×2): 12.5 mg/h via INTRAVENOUS
  Administered 2016-12-07 (×2): 10 mg/h via INTRAVENOUS
  Administered 2016-12-07: 12.5 mg/h via INTRAVENOUS
  Administered 2016-12-07: 15 mg/h via INTRAVENOUS
  Administered 2016-12-07 (×2): 12.5 mg/h via INTRAVENOUS
  Administered 2016-12-08 (×2): 15 mg/h via INTRAVENOUS
  Filled 2016-12-05 (×17): qty 200

## 2016-12-05 MED ORDER — NICARDIPINE HCL IN NACL 20-0.86 MG/200ML-% IV SOLN
3.0000 mg/h | INTRAVENOUS | Status: DC
  Administered 2016-12-05: 5 mg/h via INTRAVENOUS
  Administered 2016-12-05 (×4): 15 mg/h via INTRAVENOUS
  Filled 2016-12-05 (×4): qty 200

## 2016-12-05 MED ORDER — DEXTROSE 5 % IV SOLN
3.5000 mg/h | INTRAVENOUS | Status: DC
  Administered 2016-12-05 (×2): 4 mg/h via INTRAVENOUS
  Administered 2016-12-05: 8 mg/h via INTRAVENOUS
  Filled 2016-12-05: qty 25

## 2016-12-05 MED ORDER — PROPOFOL 1000 MG/100ML IV EMUL
0.0000 ug/kg/min | INTRAVENOUS | Status: DC
  Administered 2016-12-05: 50 ug/kg/min via INTRAVENOUS
  Administered 2016-12-05: 5 ug/kg/min via INTRAVENOUS
  Administered 2016-12-05: 40 ug/kg/min via INTRAVENOUS
  Filled 2016-12-05 (×2): qty 100

## 2016-12-05 MED ORDER — NICARDIPINE HCL IN NACL 20-0.86 MG/200ML-% IV SOLN
INTRAVENOUS | Status: AC
Start: 2016-12-05 — End: 2016-12-05
  Filled 2016-12-05: qty 200

## 2016-12-05 MED ORDER — HYDRALAZINE HCL 20 MG/ML IJ SOLN
10.0000 mg | Freq: Four times a day (QID) | INTRAMUSCULAR | Status: DC | PRN
  Administered 2016-12-05 – 2016-12-09 (×3): 10 mg via INTRAVENOUS
  Filled 2016-12-05 (×4): qty 1

## 2016-12-05 MED ORDER — MIDAZOLAM HCL 2 MG/2ML IJ SOLN
4.0000 mg | Freq: Once | INTRAMUSCULAR | Status: AC
  Administered 2016-12-05: 4 mg via INTRAVENOUS
  Filled 2016-12-05: qty 4

## 2016-12-05 MED ORDER — METOLAZONE 10 MG PO TABS
10.0000 mg | ORAL_TABLET | Freq: Once | ORAL | Status: AC
  Administered 2016-12-05: 10 mg via ORAL
  Filled 2016-12-05: qty 1

## 2016-12-05 MED ORDER — POTASSIUM CHLORIDE 20 MEQ/15ML (10%) PO SOLN
20.0000 meq | ORAL | Status: AC
  Administered 2016-12-05 (×2): 20 meq
  Filled 2016-12-05 (×2): qty 15

## 2016-12-05 MED ORDER — POTASSIUM CHLORIDE 20 MEQ/15ML (10%) PO SOLN
40.0000 meq | ORAL | Status: AC
Start: 2016-12-05 — End: 2016-12-05
  Administered 2016-12-05 (×2): 40 meq
  Filled 2016-12-05 (×2): qty 30

## 2016-12-05 MED ORDER — LABETALOL HCL 5 MG/ML IV SOLN
5.0000 mg | INTRAVENOUS | Status: DC | PRN
  Administered 2016-12-05: 10 mg via INTRAVENOUS
  Administered 2016-12-05: 5 mg via INTRAVENOUS
  Administered 2016-12-09: 10 mg via INTRAVENOUS
  Filled 2016-12-05 (×3): qty 4

## 2016-12-05 MED ORDER — PROPOFOL 1000 MG/100ML IV EMUL
INTRAVENOUS | Status: AC
Start: 2016-12-05 — End: 2016-12-05
  Administered 2016-12-05: 05:00:00
  Filled 2016-12-05: qty 100

## 2016-12-05 NOTE — Progress Notes (Signed)
OT Cancellation Note  Patient Details Name: Bryan [name] MRN: [id number] DOB: January 22, 1981   Cancelled Treatment:    Reason Eval/Treat Not Completed: Patient not medically ready. OT will sign off. Please reorder when medically appropriate. thanks  [facility] For Excellence LLCWARD,HILLARY  Ebelin Dillehay, OT/L  [phone] 12/05/2016 12/05/2016, 7:00 AM

## 2016-12-05 NOTE — Progress Notes (Signed)
eLink Physician-Brief Progress Note Patient Name: Bryan KaysDarren D [name] DOB: 04/22/1981 MRN: [id number]   Date of Service  12/05/2016  HPI/Events of Note    eICU Interventions  Hypokalemia -repleted      Intervention Category Intermediate Interventions: Electrolyte abnormality - evaluation and management  [name] V. 12/05/2016, 5:19 PM

## 2016-12-05 NOTE — Progress Notes (Signed)
eLink Physician-Brief Progress Note Patient Name: Bryan KaysDarren D Player DOB: 1980/10/14 MRN: [id number]   Date of Service  12/05/2016  HPI/Events of Note  precedex stopped earlier in shift due to bradycardia. Inadequate sedation w versed pushes. Propofol started  eICU Interventions          Raheim Beutler S. 12/05/2016, 5:07 AM

## 2016-12-05 NOTE — Progress Notes (Signed)
Cortrak Tube Team Note:  Consult received to place a Cortrak feeding tube.   A 10 F Cortrak tube was placed in the right nare and secured with a nasal bridle at 92 cm. Per the Cortrak monitor reading the tube tip is post-pyloric.   No x-ray is required. RN may begin using tube. Discussed with RN  If the tube becomes dislodged please keep the tube and contact the Cortrak team at [email or web address] (password [email or web address]) for replacement.  If after hours and replacement cannot be delayed, place a NG tube and confirm placement with an abdominal x-ray.    Romelle Starcherate Bryli Mantey MS, RD, LDN [phone] Pager  [phone] Weekend/On-Call Pager

## 2016-12-05 NOTE — Progress Notes (Signed)
No issues overnight. Pt switched to propofol from precedex last night.  EXAM:  BP (!) 148/59   Pulse 85   Temp (!) 100.7 F (38.2 C) (Axillary)   Resp (!) 28   Ht 5\' 11"  (1.803 m)   Wt (!) 485 lb 0.2 oz (220 kg)   SpO2 93%   BMI 67.65 kg/m    Per RN, was following commands on low sedation last night. Currently on low dose propofol: Opens eyes to pain Moves all extremities spontaneously, localizes. Not currently FC Wound c/d/i  IMPRESSION:  36 y.o. male SAH d# 4 s/p left Pcom clipping, neurologically stable. Requires sedation for intubation/vent.  PLAN: - Cont vent mgmt per PCCM - Keep SBP normotensive, can get up to 160mmHg

## 2016-12-05 NOTE — Progress Notes (Signed)
Transcranial Doppler  Date POD PCO2 HCT BP  MCA ACA PCA OPHT SIPH VERT Basilar  5/11 JE     Right  Left   31  29   *  *   *  *   34  *   *  *   -27     *           Right  Left                                            Right  Left                                             Right  Left                                             Right  Left                                            Right  Left                                            Right  Left                                        MCA = Middle Cerebral Artery      OPHT = Opthalmic Artery     BASILAR = Basilar Artery   ACA = Anterior Cerebral Artery     SIPH = Carotid Siphon PCA = Posterior Cerebral Artery   VERT = Verterbral Artery                   Normal MCA = 62+\-12 ACA = 50+\-12 PCA = 42+\-23     5/11 Very limited test due to body habitus and facial edema. Poor windows. Very few vessels insonated. Bilateral MCA high resistant-- unsure if this is correct vessel.  Farrel DemarkJill Eunice, RDMS, RVT 12/05/2016

## 2016-12-05 NOTE — Progress Notes (Signed)
SLP Cancellation Note  Patient Details Name: Bryan [name] MRN: [id number] DOB: [birth year]   Cancelled treatment:       Reason Eval/Treat Not Completed: Medical issues which prohibited therapy. Pt remains intubated and sedated. SLP to sign off - please reorder when ready.   Maxcine Hamaiewonsky, Shanicka Oldenkamp 12/05/2016, 8:24 AM  Maxcine HamLaura Paiewonsky, M.A. CCC-SLP [phone]

## 2016-12-06 ENCOUNTER — Inpatient Hospital Stay (HOSPITAL_COMMUNITY): Payer: BLUE CROSS/BLUE SHIELD

## 2016-12-06 DIAGNOSIS — I6789 Other cerebrovascular disease: Secondary | ICD-10-CM

## 2016-12-06 LAB — BPAM RBC
Blood Product Expiration Date: 201805312359
Blood Product Expiration Date: 201805312359
ISSUE DATE / TIME: 201805081814
ISSUE DATE / TIME: 201805081814
UNIT TYPE AND RH: 5100
Unit Type and Rh: 5100

## 2016-12-06 LAB — TYPE AND SCREEN
ABO/RH(D): O POS
ANTIBODY SCREEN: NEGATIVE
UNIT DIVISION: 0
UNIT DIVISION: 0

## 2016-12-06 LAB — PROCALCITONIN: Procalcitonin: 0.51 ng/mL

## 2016-12-06 LAB — BASIC METABOLIC PANEL
ANION GAP: 8 (ref 5–15)
ANION GAP: 10 (ref 5–15)
ANION GAP: 9 (ref 5–15)
Anion gap: 10 (ref 5–15)
BUN: 44 mg/dL — AB (ref 6–20)
BUN: 46 mg/dL — AB (ref 6–20)
BUN: 50 mg/dL — AB (ref 6–20)
BUN: 49 mg/dL — AB (ref 6–20)
CALCIUM: 8.9 mg/dL (ref 8.9–10.3)
CHLORIDE: 106 mmol/L (ref 101–111)
CHLORIDE: 108 mmol/L (ref 101–111)
CHLORIDE: 108 mmol/L (ref 101–111)
CHLORIDE: 108 mmol/L (ref 101–111)
CO2: 24 mmol/L (ref 22–32)
CO2: 25 mmol/L (ref 22–32)
CO2: 25 mmol/L (ref 22–32)
CO2: 28 mmol/L (ref 22–32)
CREATININE: 1.47 mg/dL — AB (ref 0.61–1.24)
Calcium: 8.9 mg/dL (ref 8.9–10.3)
Calcium: 8.6 mg/dL — ABNORMAL LOW (ref 8.9–10.3)
Calcium: 8.7 mg/dL — ABNORMAL LOW (ref 8.9–10.3)
Creatinine, Ser: 1.44 mg/dL — ABNORMAL HIGH (ref 0.61–1.24)
Creatinine, Ser: 1.57 mg/dL — ABNORMAL HIGH (ref 0.61–1.24)
Creatinine, Ser: 1.43 mg/dL — ABNORMAL HIGH (ref 0.61–1.24)
GFR calc Af Amer: >60 mL/min (ref >60)
GFR calc Af Amer: >60 mL/min (ref >60)
GFR calc Af Amer: >60 mL/min (ref >60)
GFR calc Af Amer: >60 mL/min (ref >60)
GFR calc non Af Amer: >60 mL/min (ref >60)
GFR calc non Af Amer: >60 mL/min (ref >60)
GFR calc non Af Amer: 56 mL/min — ABNORMAL LOW (ref >60)
GLUCOSE: 157 mg/dL — AB (ref 65–99)
GLUCOSE: 130 mg/dL — AB (ref 65–99)
GLUCOSE: 154 mg/dL — AB (ref 65–99)
Glucose, Bld: 148 mg/dL — ABNORMAL HIGH (ref 65–99)
POTASSIUM: 3.4 mmol/L — AB (ref 3.5–5.1)
POTASSIUM: 3.3 mmol/L — AB (ref 3.5–5.1)
POTASSIUM: 3.6 mmol/L (ref 3.5–5.1)
Potassium: 3.5 mmol/L (ref 3.5–5.1)
SODIUM: 145 mmol/L (ref 135–145)
Sodium: 140 mmol/L (ref 135–145)
Sodium: 141 mmol/L (ref 135–145)
Sodium: 143 mmol/L (ref 135–145)

## 2016-12-06 LAB — GLUCOSE, CAPILLARY
GLUCOSE-CAPILLARY: 126 mg/dL — AB (ref 65–99)
GLUCOSE-CAPILLARY: 111 mg/dL — AB (ref 65–99)
Glucose-Capillary: 135 mg/dL — ABNORMAL HIGH (ref 65–99)
Glucose-Capillary: 117 mg/dL — ABNORMAL HIGH (ref 65–99)
Glucose-Capillary: 146 mg/dL — ABNORMAL HIGH (ref 65–99)
Glucose-Capillary: 137 mg/dL — ABNORMAL HIGH (ref 65–99)

## 2016-12-06 LAB — BLOOD CULTURE ID PANEL (REFLEXED)
Acinetobacter baumannii: NOT DETECTED
CANDIDA PARAPSILOSIS: NOT DETECTED
CANDIDA TROPICALIS: NOT DETECTED
Candida albicans: NOT DETECTED
Candida glabrata: NOT DETECTED
Candida krusei: NOT DETECTED
Enterobacter cloacae complex: NOT DETECTED
Enterobacteriaceae species: NOT DETECTED
Enterococcus species: NOT DETECTED
Escherichia coli: NOT DETECTED
HAEMOPHILUS INFLUENZAE: NOT DETECTED
KLEBSIELLA OXYTOCA: NOT DETECTED
KLEBSIELLA PNEUMONIAE: NOT DETECTED
Listeria monocytogenes: NOT DETECTED
METHICILLIN RESISTANCE: NOT DETECTED
Neisseria meningitidis: NOT DETECTED
PROTEUS SPECIES: NOT DETECTED
Pseudomonas aeruginosa: NOT DETECTED
SERRATIA MARCESCENS: NOT DETECTED
STAPHYLOCOCCUS AUREUS BCID: NOT DETECTED
STAPHYLOCOCCUS SPECIES: DETECTED — AB
STREPTOCOCCUS PYOGENES: NOT DETECTED
Streptococcus agalactiae: NOT DETECTED
Streptococcus pneumoniae: NOT DETECTED
Streptococcus species: NOT DETECTED

## 2016-12-06 LAB — CBC
HCT: 33.1 % — ABNORMAL LOW (ref 39.0–52.0)
Hemoglobin: 10.4 g/dL — ABNORMAL LOW (ref 13.0–17.0)
MCH: 26.9 pg (ref 26.0–34.0)
MCHC: 31.4 g/dL (ref 30.0–36.0)
MCV: 85.8 fL (ref 78.0–100.0)
PLATELETS: 284 10*3/uL (ref 150–400)
RBC: 3.86 MIL/uL — ABNORMAL LOW (ref 4.22–5.81)
RDW: 14.4 % (ref 11.5–15.5)
WBC: 16.1 10*3/uL — ABNORMAL HIGH (ref 4.0–10.5)

## 2016-12-06 LAB — MAGNESIUM: Magnesium: 2.2 mg/dL (ref 1.7–2.4)

## 2016-12-06 LAB — PHOSPHORUS: Phosphorus: 3.1 mg/dL (ref 2.5–4.6)

## 2016-12-06 MED ORDER — METOPROLOL TARTRATE 25 MG/10 ML ORAL SUSPENSION
25.0000 mg | Freq: Two times a day (BID) | ORAL | Status: DC
  Administered 2016-12-06 (×2): 25 mg
  Filled 2016-12-06 (×3): qty 10

## 2016-12-06 MED ORDER — VANCOMYCIN HCL IN DEXTROSE 1-5 GM/200ML-% IV SOLN: 1000.0000 mg | Freq: Two times a day (BID) | INTRAVENOUS | Status: DC

## 2016-12-06 MED ORDER — ALBUTEROL SULFATE (2.5 MG/3ML) 0.083% IN NEBU
2.5000 mg | INHALATION_SOLUTION | RESPIRATORY_TRACT | Status: DC | PRN
  Administered 2016-12-16: 2.5 mg via RESPIRATORY_TRACT
  Filled 2016-12-06: qty 3

## 2016-12-06 MED ORDER — VANCOMYCIN HCL 10 G IV SOLR
1500.0000 mg | Freq: Three times a day (TID) | INTRAVENOUS | Status: DC
  Administered 2016-12-06 (×2): 1500 mg via INTRAVENOUS
  Filled 2016-12-06 (×3): qty 1500

## 2016-12-06 NOTE — Plan of Care (Addendum)
Blood cultures positive for staph on BCID panel Spoke to pharmacy to dose vanco, consult ordered. Likely contaminant but with rising wbc and high grade fever on 5/10, will give one dose will awaiting final speciation

## 2016-12-06 NOTE — Progress Notes (Signed)
Pt seen and examined. No issues overnight.  EXAM: Temp:  [99.9 F (37.7 C)-101.3 F (38.5 C)] 99.9 F (37.7 C) (05/12 0800) Pulse Rate:  [64-120] 96 (05/12 0810) Resp:  [14-29] 28 (05/12 0810) BP: (97-189)/(45-83) 165/60 (05/12 0810) SpO2:  [84 %-100 %] 90 % (05/12 0810) FiO2 (%):  [50 %-90 %] 70 % (05/12 0810) Weight:  [220 kg (485 lb)] 220 kg (485 lb) (05/12 0500) Intake/Output      05/11 0701 - 05/12 0700 05/12 0701 - 05/13 0700   I.V. (mL/kg) 2755.6 (12.5) 111.5 (0.5)   NG/GT 804.2    Total Intake(mL/kg) 3559.7 (16.2) 111.5 (0.5)   Urine (mL/kg/hr) 11600 (2.2) 350 (0.8)   Emesis/NG output 0 (0)    Stool 0 (0)    Total Output [id number] 350   Net -8040.3 -238.5        Stool Occurrence 2 x     Intubated, lightly sedated Opens right eye to voice, left eye swollen due to post-surgical edema Follows commands throughout  LABS: Lab Results  Component Value Date   CREATININE 1.47 (H) 12/06/2016   BUN 44 (H) 12/06/2016   NA 140 12/06/2016   K 3.5 12/06/2016   CL 106 12/06/2016   CO2 24 12/06/2016   Lab Results  Component Value Date   WBC 16.1 (H) 12/06/2016   HGB 10.4 (L) 12/06/2016   HCT 33.1 (L) 12/06/2016   MCV 85.8 12/06/2016   PLT 284 12/06/2016    IMPRESSION: - 36 y.o. male PBD 5 s/p subarachnoid hemorrhage.  Neurologically stable/improving.  PLAN: - Continue current care; vent wean as tolerated - Avoid hypotension and hypovolemia

## 2016-12-06 NOTE — Progress Notes (Signed)
ANTIBIOTIC CONSULT NOTE - INITIAL  Pharmacy Consult for Vancomycin Indication: bacteremia  Allergies  Allergen Reactions  . Penicillins Hives and Other (See Comments)    Mouth blisters - childhood allergy Has patient had a PCN reaction causing immediate rash, facial/tongue/throat swelling, SOB or lightheadedness with hypotension: Yes Has patient had a PCN reaction causing severe rash involving mucus membranes or skin necrosis: No Has patient had a PCN reaction that required hospitalization Yes Has patient had a PCN reaction occurring within the last 10 years: No If all of the above answers are "NO", then may proceed with Cephalosporin use.    Patient Measurements: Height: 5\' 11"  (180.3 cm) Weight: (!) 485 lb (220 kg) IBW/kg (Calculated) : 75.3 Adjusted Body Weight:    Vital Signs: Temp: 100.1 F (37.8 C) (05/12 1200) Temp Source: Core (Comment) (05/12 1200) BP: 133/58 (05/12 1445) Pulse Rate: 93 (05/12 1445) Intake/Output from previous day: 05/11 0701 - 05/12 0700 In: 3609.7 [I.V.:2755.6; NG/GT:854.2] Out: 1610911600 [Urine:11600] Intake/Output from this shift: Total I/O In: 1065.6 [I.V.:715.6; NG/GT:350] Out: 1000 [Urine:1000]  Labs:  Recent Labs  12/04/16 0521  12/05/16 0500  12/05/16 2305 12/06/16 0508 12/06/16 1150  WBC 13.2*  --  15.1*  --   --  16.1*  --   HGB 9.7*  --  10.8*  --   --  10.4*  --   PLT 274  --  265  --   --  284  --   CREATININE 1.47*  < > 1.69*  < > 1.36* 1.47* 1.44*  < > = values in this interval not displayed. Estimated Creatinine Clearance: 134.9 mL/min (A) (by C-G formula based on SCr of 1.44 mg/dL (H)). No results for input(s): VANCOTROUGH, VANCOPEAK, VANCORANDOM, GENTTROUGH, GENTPEAK, GENTRANDOM, TOBRATROUGH, TOBRAPEAK, TOBRARND, AMIKACINPEAK, AMIKACINTROU, AMIKACIN in the last 72 hours.   Microbiology:   Medical History: Past Medical History:  Diagnosis Date  . Complication of anesthesia    mom states they almost lost him but not  sure if he was given to much medication  . Varicose veins    mutilple bleeding episodes, bilateral lower extremities)   Assessment:  CC/HPI: sudden onset of headache. CTA demonstrated diffuse SAH over both convexities and extending into the basal cisterns. Admitted by neurosurgery with plans for angiogram 5/8 with possible clipping.   5/8 had worsening hypoxemia and increased WOB requiring intubation.   PMH: morbid obesity, varicose veins   ID: Bacteremia on IV Vancomycin. Tmax 101.3. Tc 100.1. Scr 1.44. WBC 16.1.  5/11: BC x 2: GPC in 1 5/11 BCID: Staph (no aureus, no MRSA))   Goal of Therapy:  Vancomycin trough level 15-20 mcg/ml  Plan:  Vancomycin 1500mg  IV q8hr. Trough after 3-5 doses at steady state.   Zayon Trulson S. Merilynn Finlandobertson, PharmD, BCPS Clinical Staff Pharmacist Pager [phone]  Misty Stanleyobertson, Shloime Keilman Stillinger 12/06/2016,3:32 PM

## 2016-12-06 NOTE — Progress Notes (Addendum)
PULMONARY / CRITICAL CARE MEDICINE   Name: Bryan [name] MRN: [id number] DOB: [birth year]    ADMISSION DATE:  12/01/2016 CONSULTATION DATE:  12/02/2016  REFERRING MD:  Dr. Patric Dykes   CHIEF COMPLAINT:  SAH  Brief:   36 y.o.malewith PMH of morbid obesity, varicose veins who presented to [facility] ED 12/01/16 with sudden onset of headache that started earlier that morning. CTA demonstrated diffuse SAH over both convexities and extending into the basal cisterns. Admitted by neurosurgery with plans for angiogram 5/8 with possible clipping.   5/8 had worsening hypoxemia and increased WOB requiring intubation.   SUBJECTIVE / Interval events:  Febrile 5/10, low grade temp 5/11 as well. Blood cx 1 of 2 GPC, no empiric abx started  Sedation changed to versed on 5/11 Now net negative 3.6L  VITAL SIGNS: BP (!) 165/60   Pulse 96   Temp (!) 100.5 F (38.1 C)   Resp (!) 28   Ht 5\' 11"  (1.803 m)   Wt (!) 220 kg (485 lb)   SpO2 90%   BMI 67.64 kg/m   HEMODYNAMICS:    VENTILATOR SETTINGS: Vent Mode: PRVC FiO2 (%):  [50 %-90 %] 70 % Set Rate:  [28 bmp] 28 bmp Vt Set:  [600 mL] 600 mL PEEP:  [8 cmH20] 8 cmH20 Plateau Pressure:  [25 cmH20-30 cmH20] 30 cmH20  INTAKE / OUTPUT: I/O last 3 completed shifts: In: 4249.4 [I.V.:3445.3; NG/GT:804.2] Out: 40981 [Urine:15150]  PHYSICAL EXAMINATION: General:  Obese man, intubated, sedated Neuro:  Sedation running, patient is purposeful when sedation is lightened, he did not follow commands HEENT:  ET tube in good position, surgical site clean dry and intact on scalp. Significant periOrbital edema Cardiovascular:  Regular, no murmur Lungs:  Bilateral inspiratory crackles, no wheezes Abdomen: Obese, soft, bowel sounds normal Musculoskeletal:  1+ lower sterilely edema Skin:  No rash   LABS:  BMET  Recent Labs Lab 12/05/16 1629 12/05/16 2305 12/06/16 0508  NA 138 141 140  K 3.1* 3.6 3.5  CL 107 107 106  CO2 24 25 24   BUN 41* 40* 44*   CREATININE 1.47* 1.36* 1.47*  GLUCOSE 154* 175* 157*    Electrolytes  Recent Labs Lab 12/04/16 1630  12/05/16 1030 12/05/16 1629 12/05/16 2305 12/06/16 0508  CALCIUM 8.3*  < > 8.9 8.6* 9.0 8.9  MG 2.2  --  2.4  --   --  2.2  PHOS 3.1  --  4.5  --   --  3.1  < > = values in this interval not displayed.  CBC  Recent Labs Lab 12/04/16 0521 12/05/16 0500 12/06/16 0508  WBC 13.2* 15.1* 16.1*  HGB 9.7* 10.8* 10.4*  HCT 32.1* 35.1* 33.1*  PLT 274 265 284    Coag's  Recent Labs Lab 12/01/16 2343  APTT 29  INR 1.02    Sepsis Markers  Recent Labs Lab 12/05/16 1030 12/06/16 0508  PROCALCITON 0.36 0.51    ABG  Recent Labs Lab 12/02/16 1150 12/02/16 1726 12/04/16 0837  PHART 7.206* 7.344* 7.298*  PCO2ART 61.0* 46.6 45.6  PO2ART 65.0* 175.0* 66.0*    Liver Enzymes No results for input(s): AST, ALT, ALKPHOS, BILITOT, ALBUMIN in the last 168 hours.  Cardiac Enzymes No results for input(s): TROPONINI, PROBNP in the last 168 hours.  Glucose  Recent Labs Lab 12/05/16 1221 12/05/16 1632 12/05/16 1935 12/05/16 2321 12/06/16 0322 12/06/16 0742  GLUCAP 117* 127* 144* 155* 126* 135*    Imaging Dg Chest Port 1 View  Result  Date: 12/06/2016 CLINICAL DATA:  Ventilator dependent.  Craniotomy for aneurysm EXAM: PORTABLE CHEST 1 VIEW COMPARISON:  12/05/2016 FINDINGS: Endotracheal tube in good position. Feeding tube in place with the tip not visualized. Right arm PICC tip not well visualized but enters the SVC. Hypoventilation with bibasilar atelectasis left greater than right appears unchanged. No new findings. IMPRESSION: Endotracheal tube in good position. Extensive bibasilar atelectasis left greater than right. Electronically Signed   By: Marlan Palauharles  Clark M.D.   On: 12/06/2016 07:36    STUDIES: CTA head 5/7 > diffuse SAH over both convexities and extending into the basal cisterns. CXR 5/10 > Cardiomegaly with increasing bilateral airspace opacities which  may reflect edema/atelectasis  ECHO 5/10 >> Transcranial dopplers 5/11 >> poor study  CULTURES: MRSA 5/8 > Negative  Blood 5/11 >> 1 of 2 GPC >>   ANTIBIOTICS: None.  SIGNIFICANT EVENTS: 5/7 > admit. 5/8 > PCCM consult > intubation >clipping of posterior communicating artery aneurysm   LINES/TUBES: ETT 5/8 >  DISCUSSION: 36 y.o.maleadmitted 5/7 with SAH. Required intubation 5/8 due to hypoxemia and increased WOB.   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, s/p clipping  P:   RASS goal -1 to -2 Neurosurgery following Continue nimodipine. Plan for 21 days total Continue Versed, fentanyl drips. Wean as able. Labile respiratory status and increased blood pressure when sedation has been lifted Daily wakeup assessment PT and OT  PULMONARY A: Acute Hypoxic Respiratory Failure +Pulmonary Edema P:   Continue PRBC 8 mL/kg Wean PEEP and FiO2 as able, goal SPO2 greater than 90% VAP prevention orders Follow chest x-ray Volume removal as per renal section   CARDIOVASCULAR A:  HTN P:  Telemetry monitoring Continue A-line in place Systolic blood pressure goal less than 160 per most recent neurology recommendations Start metoprolol per tube 5/12 Nicardipine drip running, wean to off within scheduled enteral blood pressure medications take effect Hydralazine, labetalol when necessary for systolic blood pressure greater than 160 Nimodipine ordered Lasix drip as below  RENAL A:   Acute Kidney Injury (Base Crt 0.89) Hypokalemia  Volume Overload P:   Decrease Lasix drip to 3 Follow BMP and urine output Replace electrolytes as indicated  GASTROINTESTINAL A:   Morbid Obesity  Nutrition  GI Prophylaxis  P:   PPI ordered TF ordered  HEMATOLOGIC A:   VTE Prophylaxis  P:  SCD in place Follow CBC  INFECTIOUS A:   Leukocytosis  Febrile 103.1 5/10 overnight, low-grade fever 5/11 One of  2 blood cultures GPC, 5/11. Question skin contaminant P:   Follow WBC and fever curve. If he has recurrent fever which started empiric antibiotics, source unclear Follow blood cultures, suspect skin contaminant  ENDOCRINE A:   No issues    P:   Follow glucose  FAMILY  - Updates: I updated the patient's mother at bedside on 5/12  - Inter-disciplinary family meet or Palliative Care meeting due by: 5/15   Independent CC time 35 minutes.   Levy Pupaobert Ignatius Kloos, MD, PhD 12/06/2016, 8:49 AM [place] [facility] [phone] or if no answer [phone]

## 2016-12-06 NOTE — Progress Notes (Signed)
PHARMACY - PHYSICIAN COMMUNICATION CRITICAL VALUE ALERT - BLOOD CULTURE IDENTIFICATION (BCID)  Results for orders placed or performed during the hospital encounter of 12/01/16  Blood Culture ID Panel (Reflexed) (Collected: 12/05/2016  9:13 AM)  Result Value Ref Range   Enterococcus species NOT DETECTED NOT DETECTED   Listeria monocytogenes NOT DETECTED NOT DETECTED   Staphylococcus species DETECTED (A) NOT DETECTED   Staphylococcus aureus NOT DETECTED NOT DETECTED   Methicillin resistance NOT DETECTED NOT DETECTED   Streptococcus species NOT DETECTED NOT DETECTED   Streptococcus agalactiae NOT DETECTED NOT DETECTED   Streptococcus pneumoniae NOT DETECTED NOT DETECTED   Streptococcus pyogenes NOT DETECTED NOT DETECTED   Acinetobacter baumannii NOT DETECTED NOT DETECTED   Enterobacteriaceae species NOT DETECTED NOT DETECTED   Enterobacter cloacae complex NOT DETECTED NOT DETECTED   Escherichia coli NOT DETECTED NOT DETECTED   Klebsiella oxytoca NOT DETECTED NOT DETECTED   Klebsiella pneumoniae NOT DETECTED NOT DETECTED   Proteus species NOT DETECTED NOT DETECTED   Serratia marcescens NOT DETECTED NOT DETECTED   Haemophilus influenzae NOT DETECTED NOT DETECTED   Neisseria meningitidis NOT DETECTED NOT DETECTED   Pseudomonas aeruginosa NOT DETECTED NOT DETECTED   Candida albicans NOT DETECTED NOT DETECTED   Candida glabrata NOT DETECTED NOT DETECTED   Candida krusei NOT DETECTED NOT DETECTED   Candida parapsilosis NOT DETECTED NOT DETECTED   Candida tropicalis NOT DETECTED NOT DETECTED    1/2 blood cultures positive with staph species, MD aware, likely contaminant.    Baldemar FridayMasters, Giovanne Nickolson M 12/06/2016  9:24 AM

## 2016-12-06 NOTE — Progress Notes (Signed)
  Echocardiogram 2D Echocardiogram has been performed.  Bryan [name], [name] 12/06/2016, 5:11 PM

## 2016-12-06 NOTE — Progress Notes (Signed)
Respiratory protocol assessment done and Patient scored a 9 with issues being on vent, non abulatory and x ray. Patient has no history and no signs of needing bronchodilator at this time. Changed to PRN, but will continue to assess as needed for any changes.

## 2016-12-06 NOTE — Plan of Care (Signed)
Problem: Nutrition: Goal: Dietary intake will improve Outcome: Progressing TF initiated 12/05/16

## 2016-12-07 ENCOUNTER — Inpatient Hospital Stay (HOSPITAL_COMMUNITY): Payer: BLUE CROSS/BLUE SHIELD

## 2016-12-07 DIAGNOSIS — Z978 Presence of other specified devices: Secondary | ICD-10-CM

## 2016-12-07 LAB — ECHOCARDIOGRAM COMPLETE
CHL CUP MV DEC (S): 218
E decel time: 218 msec
E/e' ratio: 11.68
FS: 43 % (ref 28–44)
Height: 71 in
IV/PV OW: 0.93
LA ID, A-P, ES: 47 mm
LA diam end sys: 47 mm
LA vol A4C: 51.9 ml
LADIAMINDEX: 1.36 cm/m2
LAVOL: 71.4 mL
LAVOLIN: 20.6 mL/m2
LV TDI E'LATERAL: 9.16
LV TDI E'MEDIAL: 9.96
LVEEAVG: 11.68
LVEEMED: 11.68
LVELAT: 9.16 cm/s
MV pk E vel: 107 m/s
MVPG: 5 mmHg
MVPKAVEL: 88.7 m/s
PW: 15.5 mm — AB (ref 0.6–1.1)
RV LATERAL S' VELOCITY: 32 cm/s
TAPSE: 29.6 mm
WEIGHTICAEL: 7760 [oz_av]

## 2016-12-07 LAB — CBC
HEMATOCRIT: 33.5 % — AB (ref 39.0–52.0)
Hemoglobin: 10.2 g/dL — ABNORMAL LOW (ref 13.0–17.0)
MCH: 26.4 pg (ref 26.0–34.0)
MCHC: 30.4 g/dL (ref 30.0–36.0)
MCV: 86.6 fL (ref 78.0–100.0)
Platelets: 319 10*3/uL (ref 150–400)
RBC: 3.87 MIL/uL — ABNORMAL LOW (ref 4.22–5.81)
RDW: 15.1 % (ref 11.5–15.5)
WBC: 15.5 10*3/uL — ABNORMAL HIGH (ref 4.0–10.5)

## 2016-12-07 LAB — BASIC METABOLIC PANEL
ANION GAP: 8 (ref 5–15)
BUN: 50 mg/dL — ABNORMAL HIGH (ref 6–20)
CO2: 27 mmol/L (ref 22–32)
Calcium: 8.8 mg/dL — ABNORMAL LOW (ref 8.9–10.3)
Chloride: 109 mmol/L (ref 101–111)
Creatinine, Ser: 1.53 mg/dL — ABNORMAL HIGH (ref 0.61–1.24)
GFR calc non Af Amer: 57 mL/min — ABNORMAL LOW (ref >60)
Glucose, Bld: 142 mg/dL — ABNORMAL HIGH (ref 65–99)
POTASSIUM: 3.6 mmol/L (ref 3.5–5.1)
Sodium: 144 mmol/L (ref 135–145)

## 2016-12-07 LAB — GLUCOSE, CAPILLARY
GLUCOSE-CAPILLARY: 119 mg/dL — AB (ref 65–99)
GLUCOSE-CAPILLARY: 128 mg/dL — AB (ref 65–99)
GLUCOSE-CAPILLARY: 128 mg/dL — AB (ref 65–99)
GLUCOSE-CAPILLARY: 116 mg/dL — AB (ref 65–99)
GLUCOSE-CAPILLARY: 123 mg/dL — AB (ref 65–99)
Glucose-Capillary: 122 mg/dL — ABNORMAL HIGH (ref 65–99)

## 2016-12-07 LAB — PROCALCITONIN: Procalcitonin: 0.42 ng/mL

## 2016-12-07 LAB — MAGNESIUM: Magnesium: 2.5 mg/dL — ABNORMAL HIGH (ref 1.7–2.4)

## 2016-12-07 LAB — PHOSPHORUS: Phosphorus: 3.7 mg/dL (ref 2.5–4.6)

## 2016-12-07 MED ORDER — METOPROLOL TARTRATE 25 MG/10 ML ORAL SUSPENSION
50.0000 mg | Freq: Two times a day (BID) | ORAL | Status: DC
  Administered 2016-12-07 – 2016-12-11 (×7): 50 mg
  Filled 2016-12-07 (×10): qty 20

## 2016-12-07 MED ORDER — CHLORHEXIDINE GLUCONATE CLOTH 2 % EX PADS
6.0000 | MEDICATED_PAD | Freq: Every day | CUTANEOUS | Status: DC
  Administered 2016-12-08 – 2016-12-17 (×10): 6 via TOPICAL

## 2016-12-07 NOTE — Progress Notes (Signed)
PULMONARY / CRITICAL CARE MEDICINE   Name: Bryan [name] MRN: [id number] DOB: April 08, 1981    ADMISSION DATE:  12/01/2016 CONSULTATION DATE:  12/02/2016  REFERRING MD:  Dr. Patric Dykes   CHIEF COMPLAINT:  SAH  Brief:   36 y.o.malewith PMH of morbid obesity, varicose veins who presented to [facility] ED 12/01/16 with sudden onset of headache that started earlier that morning. CTA demonstrated diffuse SAH over both convexities and extending into the basal cisterns. Admitted by neurosurgery with plans for angiogram 5/8 with possible clipping.   5/8 had worsening hypoxemia and increased WOB requiring intubation.   SUBJECTIVE / Interval events:  Single dose of vancomycin given 5/12 PM for one of 2 blood cultures positive for coag-negative staph Lasix drip decreased 5/12 >> 3 mg per hour Desaturated 5/12 with wake up Assessment, currently PEEP 8 FiO2 50%  VITAL SIGNS: BP 131/64   Pulse 100   Temp (!) 101.1 F (38.4 C) (Rectal)   Resp (!) 24   Ht 5\' 11"  (1.803 m)   Wt (!) 221.4 kg (488 lb)   SpO2 94%   BMI 68.06 kg/m   HEMODYNAMICS:    VENTILATOR SETTINGS: Vent Mode: PRVC FiO2 (%):  [50 %-100 %] 50 % Set Rate:  [28 bmp] 28 bmp Vt Set:  [600 mL] 600 mL PEEP:  [8 cmH20] 8 cmH20 Plateau Pressure:  [22 cmH20-28 cmH20] 24 cmH20  INTAKE / OUTPUT: I/O last 3 completed shifts: In: 6072.8 [I.V.:4072.8; NG/GT:1500; IV Piggyback:500] Out: 8375 [Urine:8375]  PHYSICAL EXAMINATION: General:  Obese man, intubated, sedated Neuro:  Purposeful upper extremity movements when sedation lightened, did not follow commands HEENT:  Significant  facial and periorbital edema, ET tube in good position, scalp surgical site clean and dry Cardiovascular: Regular, no murmur Lungs:  Decreased breath sounds throughout, bilateral inspiratory crackles, no wheeze Abdomen: Obese, soft, bowel sounds normal Musculoskeletal:  One to 2+ lower extremity edema Skin:  No rash  LABS:  BMET  Recent Labs Lab  12/06/16 1653 12/06/16 2300 12/07/16 0538  NA 143 145 144  K 3.3* 3.6 3.6  CL 108 108 109  CO2 25 28 27   BUN 50* 49* 50*  CREATININE 1.57* 1.43* 1.53*  GLUCOSE 154* 148* 142*    Electrolytes  Recent Labs Lab 12/05/16 1030  12/06/16 0508  12/06/16 1653 12/06/16 2300 12/07/16 0538  CALCIUM 8.9  < > 8.9  < > 8.7* 8.9 8.8*  MG 2.4  --  2.2  --   --   --  2.5*  PHOS 4.5  --  3.1  --   --   --  3.7  < > = values in this interval not displayed.  CBC  Recent Labs Lab 12/05/16 0500 12/06/16 0508 12/07/16 0538  WBC 15.1* 16.1* 15.5*  HGB 10.8* 10.4* 10.2*  HCT 35.1* 33.1* 33.5*  PLT 265 284 319    Coag's  Recent Labs Lab 12/01/16 2343  APTT 29  INR 1.02    Sepsis Markers  Recent Labs Lab 12/05/16 1030 12/06/16 0508 12/07/16 0538  PROCALCITON 0.36 0.51 0.42    ABG  Recent Labs Lab 12/02/16 1150 12/02/16 1726 12/04/16 0837  PHART 7.206* 7.344* 7.298*  PCO2ART 61.0* 46.6 45.6  PO2ART 65.0* 175.0* 66.0*    Liver Enzymes No results for input(s): AST, ALT, ALKPHOS, BILITOT, ALBUMIN in the last 168 hours.  Cardiac Enzymes No results for input(s): TROPONINI, PROBNP in the last 168 hours.  Glucose  Recent Labs Lab 12/06/16 1150 12/06/16 1651 12/06/16 1953  12/06/16 2323 12/07/16 0322 12/07/16 0743  GLUCAP 117* 146* 111* 137* 119* 128*    Imaging No results found.  STUDIES: CTA head 5/7 > diffuse SAH over both convexities and extending into the basal cisterns. CXR 5/10 > Cardiomegaly with increasing bilateral airspace opacities which may reflect edema/atelectasis  ECHO 5/10 >> Transcranial dopplers 5/11 >> poor study  CULTURES: MRSA 5/8 > Negative  Blood 5/11 >> 1 of 2 GPC >> Coag-negative staph  ANTIBIOTICS: Vancomycin 1 given 5/12  SIGNIFICANT EVENTS: 5/7 > admit. 5/8 > PCCM consult > intubation >clipping of posterior communicating artery aneurysm   LINES/TUBES: ETT 5/8 > Right upper extremity PICC 5/9 >>    DISCUSSION: 36 y.o.maleadmitted 5/7 with SAH. Required intubation 5/8 due to hypoxemia and increased WOB.   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 Desaturations with wake up have been a barrier to decreasing PT / OT  PULMONARY A: Acute Hypoxic Respiratory Failure +Pulmonary Edema OSA/OHS P:   Continue PRVC 8 mL/kg Continue wean PEEP and FiO2 as able, goal SPO2 greater than 90% VAP prevention orders in place Volume removal as per renal section Follow chest x-ray Given his body habitus, mental status I suspect that he will require tracheostomy. Discussed this with his mother on 5/12  CARDIOVASCULAR A:  HTN P:  Telemetry monitoring Systolic blood pressure goal less than 160 per most recent neurology recommendations Continue metoprolol per tube, started 5/12. Increase on 5/13 Wean nicardipine drip, goal to off Hydralazine and labetalol when necessary Nimodipine as above Lasix drip as per renal section  RENAL A:   Acute Kidney Injury (Base Crt 0.89); note received single dose vancomycin 5/12 Hypokalemia  Volume Overload P:   Increase Lasix drip back to 3.5 mg per hour on 5/13 Follow BMP and urine output Replace electrolytes as indicated  GASTROINTESTINAL A:   Morbid Obesity  Nutrition  GI Prophylaxis  P:   Pantoprazole ordered Tube feeds running  HEMATOLOGIC A:   VTE Prophylaxis  P:  Follow CBC SCD in place   INFECTIOUS A:   Leukocytosis  Fever on 5/10 Coag negative staph in one of 2 blood cultures, presumed skin contaminant P:   Received single dose vancomycin 5/12. DC on 5/13. Would not restart unless further culture information positive Follow WBC and fever curve Follow off antibiotics for now  ENDOCRINE A:   No issues     P:   Follow glucose  FAMILY  - Updates: I updated the patient's mother at bedside on 5/12  - Inter-disciplinary family meet or Palliative Care meeting due by: 5/15   Independent CC time 33 minutes.   Levy Pupaobert Venezia Sargeant, MD, PhD 12/07/2016, 8:38 AM [facility] [phone] or if no answer [phone]

## 2016-12-07 NOTE — Progress Notes (Signed)
Pt seen and examined. No issues overnight.   EXAM: Temp:  [99.9 F (37.7 C)-101.1 F (38.4 C)] 101.1 F (38.4 C) (05/13 0400) Pulse Rate:  [80-101] 100 (05/13 0819) Resp:  [24-34] 24 (05/13 0819) BP: (112-157)/(49-75) 131/64 (05/13 0745) SpO2:  [88 %-100 %] 94 % (05/13 0819) FiO2 (%):  [50 %-100 %] 50 % (05/13 0819) Weight:  [221.4 kg (488 lb)] 221.4 kg (488 lb) (05/13 0500) Intake/Output      05/12 0701 - 05/13 0700 05/13 0701 - 05/14 0700   I.V. (mL/kg) 2592.9 (11.7) 110.4 (0.5)   NG/GT 1200 50   IV Piggyback 500    Total Intake(mL/kg) 4292.9 (19.4) 160.4 (0.7)   Urine (mL/kg/hr) 4900 (0.9) 300 (0.5)   Emesis/NG output     Stool 0 (0)    Total Output 4900 300   Net -607.1 -139.6        Stool Occurrence 1 x     Intubated, sedated Opens right eye to voice PERRL Follows commands bilaterally  LABS: Lab Results  Component Value Date   CREATININE 1.53 (H) 12/07/2016   BUN 50 (H) 12/07/2016   NA 144 12/07/2016   K 3.6 12/07/2016   CL 109 12/07/2016   CO2 27 12/07/2016   Lab Results  Component Value Date   WBC 15.5 (H) 12/07/2016   HGB 10.2 (L) 12/07/2016   HCT 33.5 (L) 12/07/2016   MCV 86.6 12/07/2016   PLT 319 12/07/2016    IMPRESSION: - 36 y.o. male PBD 6, s/p craniotomy for left pcomm clipping; stable.  PLAN: - Continue supportive care - Vent wean as tolerated - Discussed d/c'ing Lasix drip with Dr. Delton CoombesByrum and he agrees; hope to avoid hypovolemia

## 2016-12-08 ENCOUNTER — Inpatient Hospital Stay (HOSPITAL_COMMUNITY): Payer: BLUE CROSS/BLUE SHIELD

## 2016-12-08 ENCOUNTER — Encounter (HOSPITAL_COMMUNITY): Payer: Self-pay | Admitting: Neurosurgery

## 2016-12-08 DIAGNOSIS — I609 Nontraumatic subarachnoid hemorrhage, unspecified: Secondary | ICD-10-CM

## 2016-12-08 LAB — CULTURE, BLOOD (ROUTINE X 2): Special Requests: ADEQUATE

## 2016-12-08 LAB — POCT I-STAT 3, ART BLOOD GAS (G3+)
ACID-BASE EXCESS: 5 mmol/L — AB (ref 0.0–2.0)
Acid-Base Excess: 4 mmol/L — ABNORMAL HIGH (ref 0.0–2.0)
Acid-Base Excess: 4 mmol/L — ABNORMAL HIGH (ref 0.0–2.0)
Acid-Base Excess: 4 mmol/L — ABNORMAL HIGH (ref 0.0–2.0)
Bicarbonate: 29.2 mmol/L — ABNORMAL HIGH (ref 20.0–28.0)
Bicarbonate: 30.5 mmol/L — ABNORMAL HIGH (ref 20.0–28.0)
Bicarbonate: 30.2 mmol/L — ABNORMAL HIGH (ref 20.0–28.0)
Bicarbonate: 29.3 mmol/L — ABNORMAL HIGH (ref 20.0–28.0)
O2 SAT: 75 %
O2 SAT: 81 %
O2 SAT: 92 %
O2 SAT: 96 %
PCO2 ART: 43.7 mmHg (ref 32.0–48.0)
PCO2 ART: 52 mmHg — AB (ref 32.0–48.0)
PCO2 ART: 55.4 mmHg — AB (ref 32.0–48.0)
PH ART: 7.379 (ref 7.350–7.450)
PH ART: 7.433 (ref 7.350–7.450)
Patient temperature: 99
Patient temperature: 99.5
TCO2: 30 mmol/L (ref 0–100)
TCO2: 32 mmol/L (ref 0–100)
TCO2: 32 mmol/L (ref 0–100)
TCO2: 31 mmol/L (ref 0–100)
pCO2 arterial: 48.6 mmHg — ABNORMAL HIGH (ref 32.0–48.0)
pH, Arterial: 7.344 — ABNORMAL LOW (ref 7.350–7.450)
pH, Arterial: 7.389 (ref 7.350–7.450)
pO2, Arterial: 42 mmHg — ABNORMAL LOW (ref 83.0–108.0)
pO2, Arterial: 45 mmHg — ABNORMAL LOW (ref 83.0–108.0)
pO2, Arterial: 70 mmHg — ABNORMAL LOW (ref 83.0–108.0)
pO2, Arterial: 88 mmHg (ref 83.0–108.0)

## 2016-12-08 LAB — MAGNESIUM: Magnesium: 2.8 mg/dL — ABNORMAL HIGH (ref 1.7–2.4)

## 2016-12-08 LAB — CBC
HCT: 34.7 % — ABNORMAL LOW (ref 39.0–52.0)
Hemoglobin: 10.7 g/dL — ABNORMAL LOW (ref 13.0–17.0)
MCH: 27 pg (ref 26.0–34.0)
MCHC: 30.8 g/dL (ref 30.0–36.0)
MCV: 87.4 fL (ref 78.0–100.0)
PLATELETS: 332 10*3/uL (ref 150–400)
RBC: 3.97 MIL/uL — ABNORMAL LOW (ref 4.22–5.81)
RDW: 15.4 % (ref 11.5–15.5)
WBC: 15.2 10*3/uL — ABNORMAL HIGH (ref 4.0–10.5)

## 2016-12-08 LAB — BASIC METABOLIC PANEL
Anion gap: 10 (ref 5–15)
BUN: 51 mg/dL — ABNORMAL HIGH (ref 6–20)
CO2: 29 mmol/L (ref 22–32)
CREATININE: 1.28 mg/dL — AB (ref 0.61–1.24)
Calcium: 8.8 mg/dL — ABNORMAL LOW (ref 8.9–10.3)
Chloride: 110 mmol/L (ref 101–111)
GFR calc Af Amer: >60 mL/min (ref >60)
GLUCOSE: 136 mg/dL — AB (ref 65–99)
Potassium: 3.6 mmol/L (ref 3.5–5.1)
Sodium: 149 mmol/L — ABNORMAL HIGH (ref 135–145)

## 2016-12-08 LAB — GLUCOSE, CAPILLARY
GLUCOSE-CAPILLARY: 123 mg/dL — AB (ref 65–99)
GLUCOSE-CAPILLARY: 146 mg/dL — AB (ref 65–99)
GLUCOSE-CAPILLARY: 125 mg/dL — AB (ref 65–99)
Glucose-Capillary: 131 mg/dL — ABNORMAL HIGH (ref 65–99)
Glucose-Capillary: 136 mg/dL — ABNORMAL HIGH (ref 65–99)
Glucose-Capillary: 136 mg/dL — ABNORMAL HIGH (ref 65–99)

## 2016-12-08 MED ORDER — FREE WATER
100.0000 mL | Freq: Three times a day (TID) | Status: DC
  Administered 2016-12-08 – 2016-12-09 (×3): 100 mL

## 2016-12-08 NOTE — Progress Notes (Signed)
eLink Physician-Brief Progress Note Patient Name: Bryan KaysDarren D Duddy DOB: July 13, 1981 MRN: [id number]   Date of Service  12/08/2016  HPI/Events of Note  Now on 1.00 FiO2, PEEP 10, RR 30 Vt 600 CXR 5/13 was better. O > I, -6L.   eICU Interventions  Check ABG now and adjust     Intervention Category Major Interventions: Respiratory failure - evaluation and management  Martise Waddell S. 12/08/2016, 5:13 PM

## 2016-12-08 NOTE — Progress Notes (Signed)
RT NOTE:  AM ABG redrawn, possibly venous. 2nd sample results similar. RT feels certain that this sample was arterial. ABG drawn on 40% FiO2/ 8+ Peep. RT increased FiO2 to 100%. Pt has not had ABG since 12/04/16.

## 2016-12-08 NOTE — Progress Notes (Addendum)
Transcranial Doppler  Date POD PCO2 HCT BP  MCA ACA PCA OPHT SIPH VERT Basilar  5/9 RS     Right Left * * * * * * 22 29 39 * -15 * *  5/11 JE     Right  Left   31  29   *  *   *  *   34  *   *  *   -27     *      5/14 JE     Right  Left   27  32   *  -25   *  *   44  48   50  64   *  *   *           Right  Left                                             Right  Left                                             Right  Left                                            Right  Left                                            Right  Left                                        MCA = Middle Cerebral Artery      OPHT = Opthalmic Artery     BASILAR = Basilar Artery   ACA = Anterior Cerebral Artery     SIPH = Carotid Siphon PCA = Posterior Cerebral Artery   VERT = Verterbral Artery                   Normal MCA = 62+\-12 ACA = 50+\-12 PCA = 42+\-23   First TCD attempt 12/01/16 with no velocities obtained due to poor windows. RS  5/9 Very limited windows. RS  5/11 Very limited test due to body habitus and facial edema. Poor windows. Very few vessels insonated. Bilateral MCA high resistant-- unsure if this is correct vessel. JE  5/14 Very limited windows. JE  Farrel DemarkJill Eunice, RDMS, RVT 12/08/2016

## 2016-12-08 NOTE — Progress Notes (Signed)
PULMONARY / CRITICAL CARE MEDICINE   Name: Kem KaysDarren D Skates MRN: [id number] DOB: Aug 07, 1980    ADMISSION DATE:  12/01/2016 CONSULTATION DATE:  12/02/2016  REFERRING MD:  Dr. Patric DykesNunkumar   CHIEF COMPLAINT:  SAH  BRIEF SUMMARY:  36 y.o.malewith PMH of morbid obesity, varicose veins who [facility] ED 12/01/16 with sudden onset of headache that started earlier that morning. CTA demonstrated diffuse SAH over both convexities and extending into the basal cisterns. Admitted by neurosurgery with plans for angiogram 5/8 with possible clipping.  Subsequently developed worsening hypoxemia and increased WOB requiring intubation.    SUBJECTIVE:  RT reports ABG 7.37 / 52 / 42 / 30.  No acute events overnight.  VITAL SIGNS: BP (!) 106/54   Pulse 95   Temp 98.7 F (37.1 C) (Rectal)   Resp (!) 28   Ht 5\' 11"  (1.803 m)   Wt (!) 464 lb (210.5 kg)   SpO2 95%   BMI 64.71 kg/m   HEMODYNAMICS:    VENTILATOR SETTINGS: Vent Mode: PRVC FiO2 (%):  [40 %-100 %] 100 % Set Rate:  [28 bmp] 28 bmp Vt Set:  [600 mL] 600 mL PEEP:  [8 cmH20] 8 cmH20 Plateau Pressure:  [24 cmH20-35 cmH20] 35 cmH20  INTAKE / OUTPUT: I/O last 3 completed shifts: In: 5889.1 [I.V.:3549.1; NG/GT:1840; IV Piggyback:500] Out: 8200 [Urine:8200]  PHYSICAL EXAMINATION: General: morbidly obese male in NAD on vent  HEENT: MM pink/moist, ETT, NGT in place  Neuro: AAOx4, speech clear CV: s1s2 rrr, no m/r/g PULM: even/non-labored, lungs bilaterally coarse JX:BJYNGI:soft, non-tender, bsx4 active  Extremities: warm/dry, trace generalized edema  Skin: no rashes or lesions  LABS:  BMET  Recent Labs Lab 12/06/16 2300 12/07/16 0538 12/08/16 0430  NA 145 144 149*  K 3.6 3.6 3.6  CL 108 109 110  CO2 28 27 29   BUN 49* 50* 51*  CREATININE 1.43* 1.53* 1.28*  GLUCOSE 148* 142* 136*   Electrolytes  Recent Labs Lab 12/05/16 1030  12/06/16 0508  12/06/16 2300 12/07/16 0538 12/08/16 0430  CALCIUM 8.9  < > 8.9  < > 8.9 8.8* 8.8*   MG 2.4  --  2.2  --   --  2.5* 2.8*  PHOS 4.5  --  3.1  --   --  3.7  --   < > = values in this interval not displayed.  CBC  Recent Labs Lab 12/06/16 0508 12/07/16 0538 12/08/16 0430  WBC 16.1* 15.5* 15.2*  HGB 10.4* 10.2* 10.7*  HCT 33.1* 33.5* 34.7*  PLT 284 319 332   Coag's  Recent Labs Lab 12/01/16 2343  APTT 29  INR 1.02   Sepsis Markers  Recent Labs Lab 12/05/16 1030 12/06/16 0508 12/07/16 0538  PROCALCITON 0.36 0.51 0.42   ABG  Recent Labs Lab 12/04/16 0837 12/08/16 0550 12/08/16 0609  PHART 7.298* 7.433 7.379  PCO2ART 45.6 43.7 52.0*  PO2ART 66.0* 45.0* 42.0*   Liver Enzymes No results for input(s): AST, ALT, ALKPHOS, BILITOT, ALBUMIN in the last 168 hours.  Cardiac Enzymes No results for input(s): TROPONINI, PROBNP in the last 168 hours.  Glucose  Recent Labs Lab 12/07/16 1231 12/07/16 1526 12/07/16 2004 12/07/16 2321 12/08/16 0316 12/08/16 0757  GLUCAP 128* 122* 116* 123* 123* 146*   Imaging I reviewed CXR myself, ETT in good position  STUDIES: CTA head 5/7 >> diffuse SAH over both convexities and extending into the basal cisterns. CXR 5/10 >> Cardiomegaly with increasing bilateral airspace opacities which may reflect edema/atelectasis  ECHO 5/10 >> EF of 65-70% and LVH, poor image to determine diastolic dysfunction Transcranial dopplers 5/11 >> poor study  CULTURES: MRSA 5/8 > Negative  Blood 5/11 >> 1 of 2 GPC >> Coag-negative staph  ANTIBIOTICS: Vancomycin 1 given 5/12  SIGNIFICANT EVENTS: 5/07  Admit with sudden headache, SAH. 5/08  PCCM consult > intubation >clipping of posterior communicating artery aneurysm  5/12  Vanc x1 for 1/2 coag neg staph, lasix gtt decreased, desats with WUA  LINES/TUBES: ETT 5/8 > Right upper extremity PICC 5/9 >>   DISCUSSION: 36 y.o.maleadmitted 5/7 with SAH. Required intubation 5/8 due to hypoxemia and increased WOB.  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  D8/21 Versed 4 mg / Fentanyl 300 mcg gtt for pain / sedation  PT efforts as able  PULMONARY A: Acute Hypoxic Respiratory Failure +Pulmonary Edema OSA/OHS P:   PRVC 8 cc/kg  Adjust rate to 30 Keep PEEP at 10 for now Wean O2 for sats > 92% VAP prevention measures  Intermittent CXR Concern he will need tracheostomy given body habitus / desaturations with weaning.  Mother aware. Follow up ABG at noon   CARDIOVASCULAR A:  HTN P:  ICU monitoring  SBP goal <160 per Neurology  Continue metoprolol 50 mg PT PRN hydralazine, labetalol Monitor I/O's Nimodipine as below  RENAL A:   Acute Kidney Injury (Base Crt 0.89) - note received single dose vancomycin 5/12, improved Hypernatremia - mild Hypokalemia  Volume Overload P:   Trend BMP / urinary output Replace electrolytes as indicated Avoid nephrotoxic agents, ensure adequate renal perfusion Reassess 5/15 for lasix need  Add 100 ml free water PT Q8  GASTROINTESTINAL A:   Morbid Obesity  Nutrition  GI Prophylaxis  P:   PPI for SUP  TF per nutrition   HEMATOLOGIC A:   VTE Prophylaxis  P:  Follow CBC  SCD's for DVT prophylaxis No heparin given SAH  INFECTIOUS A:   Leukocytosis  Fever on 5/10 Coag negative staph in one of 2 blood cultures, presumed skin contaminant P:   ABX as above  Follow fever curve / WBC trend  Monitor off abx   ENDOCRINE A:   Hyperglycemia   P:   Follow glucose on BMP   FAMILY  - Updates: Mother updated at bedside 5/14.   - Inter-disciplinary family meet or Palliative Care meeting due by: 5/15   CC Time: 30 minutes   Canary Brim, NP-C [facility] Pgr: [phone] or if no answer [phone] 12/08/2016, 10:37 AM  Attending Note:  36 year old male with SAH who presents to [facility] with  AMS.  Patient is super morbidly obese and heart failure and pulmonary edema.  Patient was intubated and diuresed.  Desaturated this morning on wake up assessment.  On exam, diffuse crackles.  I reviewed CXR myself, ETT in good position.  Spoke with mother, introduced the idea of tracheostomy towards the end of the week.  I just do not see how he can successfully extubate given body habitus.  Will hold further diureses today and start free water at 100 ml TID.  Titrate O2 for sat of 88-92%.  The patient is critically ill with multiple organ systems failure and requires high complexity decision making for assessment and support, frequent evaluation and  titration of therapies, application of advanced monitoring technologies and extensive interpretation of multiple databases.   Critical Care Time devoted to patient care services described in this note is  35  Minutes. This time reflects time of care of this signee Dr Jennet Maduro. This critical care time does not reflect procedure time, or teaching time or supervisory time of PA/NP/Med student/Med Resident etc but could involve care discussion time.  Rush Farmer, M.D. [facility] Medicine. Pager: [phone]. After hours pager: [phone].

## 2016-12-08 NOTE — Progress Notes (Signed)
No issues overnight. Remains intubated, sedated on vent.  EXAM:  BP (!) 107/52   Pulse 98   Temp (!) 102.8 F (39.3 C) (Oral)   Resp (!) 30   Ht 5\' 11"  (1.803 m)   Wt (!) 210.5 kg (464 lb)   SpO2 99%   BMI 64.71 kg/m   On sedation vacation earlier today, per RN pt was opening right eye, following commands in all extremities.  TCD reviewed, obtainable velocities from bilateral MCA, LACA, and bilateral carotid are not indicative of spasm.  IMPRESSION:  36 y.o. male SAH d# 7 s/p left Pcom clipping. Appears to be neurologically stable, although requires sedation for vent. TCD show normal velocities. - VDRF  PLAN: - Cont vent mgmt per PCCM. May need trach later this week - Cont to monitor neurologic status

## 2016-12-08 NOTE — Progress Notes (Signed)
5 nurses brought to bedside to help with cleanup and reinsertion of rectal thermometer to restart cooling blanket. Pink frothy sputum noted in ett, Critical Care Team notified.

## 2016-12-09 ENCOUNTER — Inpatient Hospital Stay (HOSPITAL_COMMUNITY): Payer: BLUE CROSS/BLUE SHIELD

## 2016-12-09 DIAGNOSIS — I10 Essential (primary) hypertension: Secondary | ICD-10-CM

## 2016-12-09 DIAGNOSIS — J9601 Acute respiratory failure with hypoxia: Secondary | ICD-10-CM

## 2016-12-09 LAB — MAGNESIUM: MAGNESIUM: 2.4 mg/dL (ref 1.7–2.4)

## 2016-12-09 LAB — URINALYSIS, ROUTINE W REFLEX MICROSCOPIC
BILIRUBIN URINE: NEGATIVE
GLUCOSE, UA: NEGATIVE mg/dL
KETONES UR: NEGATIVE mg/dL
Nitrite: NEGATIVE
PH: 5 (ref 5.0–8.0)
Protein, ur: NEGATIVE mg/dL
SPECIFIC GRAVITY, URINE: 1.013 (ref 1.005–1.030)

## 2016-12-09 LAB — POCT I-STAT 3, ART BLOOD GAS (G3+)
Acid-Base Excess: 7 mmol/L — ABNORMAL HIGH (ref 0.0–2.0)
Bicarbonate: 33.4 mmol/L — ABNORMAL HIGH (ref 20.0–28.0)
O2 SAT: 98 %
PCO2 ART: 58.2 mmHg — AB (ref 32.0–48.0)
PO2 ART: 116 mmHg — AB (ref 83.0–108.0)
Patient temperature: 100.3
TCO2: 35 mmol/L (ref 0–100)
pH, Arterial: 7.371 (ref 7.350–7.450)

## 2016-12-09 LAB — BLOOD GAS, ARTERIAL
ACID-BASE EXCESS: 5 mmol/L — AB (ref 0.0–2.0)
Bicarbonate: 30.1 mmol/L — ABNORMAL HIGH (ref 20.0–28.0)
DRAWN BY: 308601
FIO2: 0.9
LHR: 30 {breaths}/min
MECHVT: 600 mL
O2 SAT: 91.8 %
PATIENT TEMPERATURE: 99.7
PEEP/CPAP: 10 cmH2O
PH ART: 7.357 (ref 7.350–7.450)
PO2 ART: 72.1 mmHg — AB (ref 83.0–108.0)
pCO2 arterial: 55.5 mmHg — ABNORMAL HIGH (ref 32.0–48.0)

## 2016-12-09 LAB — CBC
HEMATOCRIT: 32.2 % — AB (ref 39.0–52.0)
HEMOGLOBIN: 9.5 g/dL — AB (ref 13.0–17.0)
MCH: 26.5 pg (ref 26.0–34.0)
MCHC: 29.5 g/dL — ABNORMAL LOW (ref 30.0–36.0)
MCV: 89.9 fL (ref 78.0–100.0)
Platelets: 333 10*3/uL (ref 150–400)
RBC: 3.58 MIL/uL — ABNORMAL LOW (ref 4.22–5.81)
RDW: 15.5 % (ref 11.5–15.5)
WBC: 15.5 10*3/uL — ABNORMAL HIGH (ref 4.0–10.5)

## 2016-12-09 LAB — BASIC METABOLIC PANEL
Anion gap: 7 (ref 5–15)
BUN: 40 mg/dL — AB (ref 6–20)
CO2: 30 mmol/L (ref 22–32)
CREATININE: 0.95 mg/dL (ref 0.61–1.24)
Calcium: 8.3 mg/dL — ABNORMAL LOW (ref 8.9–10.3)
Chloride: 115 mmol/L — ABNORMAL HIGH (ref 101–111)
GFR calc Af Amer: >60 mL/min (ref >60)
GLUCOSE: 129 mg/dL — AB (ref 65–99)
POTASSIUM: 3.4 mmol/L — AB (ref 3.5–5.1)
SODIUM: 152 mmol/L — AB (ref 135–145)

## 2016-12-09 LAB — PROCALCITONIN: Procalcitonin: 0.45 ng/mL

## 2016-12-09 LAB — GLUCOSE, CAPILLARY
GLUCOSE-CAPILLARY: 131 mg/dL — AB (ref 65–99)
Glucose-Capillary: 118 mg/dL — ABNORMAL HIGH (ref 65–99)
Glucose-Capillary: 151 mg/dL — ABNORMAL HIGH (ref 65–99)
Glucose-Capillary: 113 mg/dL — ABNORMAL HIGH (ref 65–99)
Glucose-Capillary: 124 mg/dL — ABNORMAL HIGH (ref 65–99)

## 2016-12-09 LAB — BRAIN NATRIURETIC PEPTIDE: B Natriuretic Peptide: 21.1 pg/mL (ref 0.0–100.0)

## 2016-12-09 MED ORDER — POTASSIUM CHLORIDE 20 MEQ/15ML (10%) PO SOLN
40.0000 meq | Freq: Two times a day (BID) | ORAL | Status: AC
  Administered 2016-12-09 (×2): 40 meq
  Filled 2016-12-09 (×2): qty 30

## 2016-12-09 MED ORDER — DEXTROSE 5 % IV SOLN
2.0000 g | Freq: Three times a day (TID) | INTRAVENOUS | Status: DC
  Filled 2016-12-09 (×3): qty 2

## 2016-12-09 MED ORDER — FREE WATER
200.0000 mL | Freq: Three times a day (TID) | Status: DC
  Administered 2016-12-09 – 2016-12-12 (×8): 200 mL

## 2016-12-09 MED ORDER — DEXTROSE 5 % IV SOLN
2.0000 g | Freq: Three times a day (TID) | INTRAVENOUS | Status: DC
  Administered 2016-12-09 – 2016-12-15 (×18): 2 g via INTRAVENOUS
  Filled 2016-12-09 (×19): qty 2

## 2016-12-09 MED ORDER — FUROSEMIDE 10 MG/ML IJ SOLN
40.0000 mg | Freq: Two times a day (BID) | INTRAMUSCULAR | Status: DC
  Administered 2016-12-09 – 2016-12-11 (×3): 40 mg via INTRAVENOUS
  Filled 2016-12-09 (×3): qty 4

## 2016-12-09 NOTE — Progress Notes (Signed)
eLink Physician-Brief Progress Note Patient Name: Kem KaysDarren D Talavera DOB: 1981-07-16 MRN: [id number]   Date of Service  12/09/2016  HPI/Events of Note  LE venous Doppler studies just completed. Verbal report of DVT - possibly bilateral - given to me  eICU Interventions  Order placed requesting IVC placement by IR. The report indicates popliteal DVT - therefore, does not need to be done emergently tonight     Intervention Category Intermediate Interventions: Other:  Merwyn KatosDavid B Aleayah [place] 12/09/2016, 7:04 PM

## 2016-12-09 NOTE — Progress Notes (Signed)
*  Preliminary Results* Bilateral lower extremity venous duplex completed. Study was very technically difficult and limited due to patient body habitus and depth of vessels. Unable to visualize most vessels of bilateral legs with the exception of the left popliteal vein which exhibits probable acute deep vein thrombosis, as well as the left posterior tibial veins that exhibit evidence of possible deep vein thrombosis. Due to technical limitations unable to exclude deep vein thrombosis in the right lower extremity.  Preliminary results discussed with Susy FrizzleMatt, RN.  12/09/2016 6:55 PM Gertie FeyMichelle Iverna Hammac, BS, RVT, RDCS, RDMS

## 2016-12-09 NOTE — Progress Notes (Signed)
PULMONARY / CRITICAL CARE MEDICINE   Name: Bryan [name] MRN: [id number] DOB: Feb 13, 1981    ADMISSION DATE:  12/01/2016 CONSULTATION DATE:  12/02/2016  REFERRING MD:  Dr. Patric Dykes   CHIEF COMPLAINT:  SAH  BRIEF SUMMARY:  36 y.o.malewith PMH of morbid obesity, varicose veins who presented to [facility] ED 12/01/16 with sudden onset of headache that started earlier that morning. CTA demonstrated diffuse SAH over both convexities and extending into the basal cisterns. Admitted by neurosurgery with plans for angiogram 5/8 with possible clipping.  Subsequently developed worsening hypoxemia and increased WOB requiring intubation.    SUBJECTIVE:   RN reports pt remains on 4 mg versed, 400 fentanyl, fever to 102.8 overnight.  Pink frothy sputum late yesterday.  VITAL SIGNS: BP (!) 141/66   Pulse 78   Temp 99.5 F (37.5 C) (Rectal)   Resp (!) 30   Ht 5\' 11"  (1.803 m)   Wt (!) 1051 lb 9.5 oz (477 kg)   SpO2 100%   BMI 146.67 kg/m   HEMODYNAMICS:    VENTILATOR SETTINGS: Vent Mode: PRVC FiO2 (%):  [90 %-100 %] 90 % Set Rate:  [30 bmp] 30 bmp Vt Set:  [600 mL] 600 mL PEEP:  [10 cmH20] 10 cmH20 Plateau Pressure:  [24 cmH20-33 cmH20] 31 cmH20  INTAKE / OUTPUT: I/O last 3 completed shifts: In: 3774.5 [I.V.:2074.5; NG/GT:1700] Out: 7275 [Urine:6700; Emesis/NG output:350; Stool:225]  PHYSICAL EXAMINATION: General:  Super morbidly obese male in NAD on vent HEENT: MM pink/moist, ETT, left side facial swelling, staples intact, surgical dressing left frontal, left eye swollen shut with mild ecchymosis  Neuro: sedate CV: s1s2 rrr, no m/r/g PULM: even/non-labored, lungs bilaterally coarse IO:NGEX, non-tender, bsx4 active  Extremities: warm/dry, trace generalized edema  Skin: no rashes or lesions  LABS:  BMET  Recent Labs Lab 12/07/16 0538 12/08/16 0430 12/09/16 0501  NA 144 149* 152*  K 3.6 3.6 3.4*  CL 109 110 115*  CO2 27 29 30   BUN 50* 51* 40*  CREATININE 1.53* 1.28* 0.95   GLUCOSE 142* 136* 129*   Electrolytes  Recent Labs Lab 12/05/16 1030  12/06/16 0508  12/07/16 0538 12/08/16 0430 12/09/16 0501  CALCIUM 8.9  < > 8.9  < > 8.8* 8.8* 8.3*  MG 2.4  --  2.2  --  2.5* 2.8* 2.4  PHOS 4.5  --  3.1  --  3.7  --   --   < > = values in this interval not displayed.  CBC  Recent Labs Lab 12/07/16 0538 12/08/16 0430 12/09/16 0501  WBC 15.5* 15.2* 15.5*  HGB 10.2* 10.7* 9.5*  HCT 33.5* 34.7* 32.2*  PLT 319 332 333   Coag's No results for input(s): APTT, INR in the last 168 hours. Sepsis Markers  Recent Labs Lab 12/05/16 1030 12/06/16 0508 12/07/16 0538  PROCALCITON 0.36 0.51 0.42   ABG  Recent Labs Lab 12/08/16 1140 12/08/16 1747 12/09/16 0416  PHART 7.344* 7.389 7.357  PCO2ART 55.4* 48.6* 55.5*  PO2ART 70.0* 88.0 72.1*   Liver Enzymes No results for input(s): AST, ALT, ALKPHOS, BILITOT, ALBUMIN in the last 168 hours.  Cardiac Enzymes No results for input(s): TROPONINI, PROBNP in the last 168 hours.  Glucose  Recent Labs Lab 12/08/16 1200 12/08/16 1544 12/08/16 2002 12/08/16 2344 12/09/16 0328 12/09/16 0750  GLUCAP 125* 136* 131* 136* 131* 118*   Imaging CXR 5/15 >> interval worsening of left lung opacity, edema vs opacity/LLL  STUDIES: CTA head 5/7 >> diffuse SAH over both  convexities and extending into the basal cisterns. CXR 5/10 >> Cardiomegaly with increasing bilateral airspace opacities which may reflect edema/atelectasis  ECHO 5/10 >> EF of 65-70% and LVH, poor image to determine diastolic dysfunction Transcranial dopplers 5/11 >> poor study LE Doppler 5/15 >>   CULTURES: MRSA 5/8 > Negative  Blood 5/11 >> 1 of 2 GPC >> Coag-negative staph  ANTIBIOTICS: Vancomycin 1 given 5/12 Cefepime 5/15 (? New L infiltrate) >>   SIGNIFICANT EVENTS: 5/07  Admit with sudden headache, SAH. 5/08  PCCM consult > intubation >clipping of posterior communicating artery aneurysm  5/12  Vanc x1 for 1/2 coag neg staph,  lasix gtt decreased, desats with WUA 5/13  Lasix gtt discontinued 5/14  Fever overnight to 102.8, pink frothy sputum late evening, difficulty oxygenating   LINES/TUBES: ETT 5/8 > Right upper extremity PICC 5/9 >>   DISCUSSION: 36 y.o.maleadmitted 5/7 with SAH. Required intubation 5/8 due to hypoxemia and increased WOB.  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  D9/21  Versed / Fentanyl gtt for pain / sedation  PT efforts as able TCD's per NSGY  PULMONARY A: Acute Hypoxic Respiratory Failure + Pulmonary Edema New L Airspace Disease - new 5/15, edema vs infiltrate OSA/OHS P:   PRVC 8 cc/kg  Keep PEEP at 10  Wean O2 for sats > 92% VAP prevention measures Intermittent CXR He will need tracheostomy given prolonged illness / body habitus.  Mother aware.   Lasix 40 mg IV BID   CARDIOVASCULAR A:  HTN P:  ICU monitoring  SBP goal < 160 per Neurology Continue Metoprolol 50 mg PT  PRN hydralazine, labetalol Monitor I/O's  Nimodipine as below  RENAL A:   Acute Kidney Injury (Base Crt 0.89) - note received single dose vancomycin 5/12, improved Hypernatremia - mild Hypokalemia  Volume Overload P:   Trend BMP / urinary output Replace electrolytes as indicated Avoid nephrotoxic agents, ensure adequate renal perfusion Lasix as above  Continue free water PT Q8, increase to 200 ml  GASTROINTESTINAL A:   Morbid Obesity  Nutrition  GI Prophylaxis  P:   PPI for SUP  TF per nutrition   HEMATOLOGIC A:   VTE Prophylaxis  P:  Follow CBC  SCD's for DVT prophylaxis  No heparin given SAH   INFECTIOUS A:   Leukocytosis  Fever - 5/10, 5/15 Coag negative staph in one of 2 blood cultures, presumed skin contaminant P:   Resume cefepime 5/15 Follow fever curve / WBC trend  Monitor off  abx Assess LE doppler with fever, difficulty oxygenating  ENDOCRINE A:   Hyperglycemia   P:   Follow glucose on BMP   FAMILY  - Updates:  No family at bedside am 5/15.  Will update on arrival.   - Inter-disciplinary family meet or Palliative Care meeting due by: 5/15   CC Time: 30 minutes   Canary BrimBrandi Ollis, NP-C [facility] Pgr: [phone] or if no answer [phone] 12/09/2016, 8:21 AM  Attending Note:  36 year old male that is super morbidly obese with heart failure and respiratory failure s/p SAH that required surgical intervention.  Patient is more difficult to oxygenate today.  On exam, lungs with diffuse crackles and pink frothy material in ETT.  I reviewed CXR myself, difficult to visualize but clearly with  pleural effusions and pulmonary edema.  Echo was difficult due to size.  Will switch to APRV today with repeat gas to evaluate for the degree of hypercarbia that is expected.  Will continue full support.  Spoke with mother, she is agreeable to tracheostomy but not quite safe to perform now.  Keep off abx and monitor.  The patient is critically ill with multiple organ systems failure and requires high complexity decision making for assessment and support, frequent evaluation and titration of therapies, application of advanced monitoring technologies and extensive interpretation of multiple databases.   Critical Care Time devoted to patient care services described in this note is  35  Minutes. This time reflects time of care of this signee Dr Koren Bound. This critical care time does not reflect procedure time, or teaching time or supervisory time of PA/NP/Med student/Med Resident etc but could involve care discussion time.  Alyson Reedy, M.D. [facility]monary/Critical Care Medicine. Pager: [phone]. After hours pager: [phone].

## 2016-12-09 NOTE — Progress Notes (Signed)
No issues overnight, changed to APRV on vent.   EXAM:  BP (!) 150/84   Pulse 85   Temp 99.5 F (37.5 C) (Rectal)   Resp (!) 22   Ht 5\' 11"  (1.803 m)   Wt (!) 477 kg (1051 lb 9.5 oz)   SpO2 100%   BMI 146.67 kg/m   On fentanyl/versed Opens eyes to voice Intermittently follows commands, L>R  IMPRESSION:  36 y.o. male SAH d# 8 s/p clipping Pcom aneurysm. Likely stable neurologically, although difficult to get accurate exam with sedation. VDRF  PLAN: - Cont vent mgmt per PCCM.  - Cont supportive care.

## 2016-12-09 NOTE — Progress Notes (Signed)
Nutrition Follow-up  DOCUMENTATION CODES:   Morbid obesity  INTERVENTION:   Continue: Vital High Protein @ 50 ml/hr (1200 ml/day) 60 ml Prostat TID MVI daily 200 ml free water every 8 hours  Provides: 1800 kcal, 195 grams protein, and 1003 ml free water  Total free water: 1603 ml   NUTRITION DIAGNOSIS:   Inadequate oral intake related to inability to eat as evidenced by NPO status. Ongoing.   GOAL:   Provide needs based on ASPEN/SCCM guidelines Met.   MONITOR:   TF tolerance, Vent status  ASSESSMENT:   Pt with PMH of morbid obesity and varicose veins admitted with sudden HA. CTA demonstrated diffuse SAH over both convexities and extending into the basal cisterns. 5/8 pt with worsening hypoxemia and increased WOB and intubated.   Remains intubated, plans for trach pending 5/14 fever overnight 5/15 new L airspace disease (edema vs infiltrate)  Current weight inaccurate, 477 lb not kg  Diet Order:  Diet NPO time specified  Skin:  Reviewed, no issues  Last BM:  5/14  Height:   Ht Readings from Last 1 Encounters:  12/02/16 '5\' 11"'$  (1.803 m)    Weight:   Wt Readings from Last 1 Encounters:  12/09/16 (!) 1051 lb 9.5 oz (477 kg)    Ideal Body Weight:  78.1 kg  BMI:  Body mass index is 146.67 kg/m.  Estimated Nutritional Needs:   Kcal:  1025-8527  Protein:  195 grams  Fluid:  >1.7 L/day  EDUCATION NEEDS:   No education needs identified at this time  [place], Angie, [place] Pager [phone] After Hours Pager

## 2016-12-10 ENCOUNTER — Inpatient Hospital Stay (HOSPITAL_COMMUNITY): Payer: BLUE CROSS/BLUE SHIELD

## 2016-12-10 DIAGNOSIS — I609 Nontraumatic subarachnoid hemorrhage, unspecified: Secondary | ICD-10-CM

## 2016-12-10 LAB — BLOOD GAS, ARTERIAL
Acid-Base Excess: 2.6 mmol/L — ABNORMAL HIGH (ref 0.0–2.0)
Bicarbonate: 27.9 mmol/L (ref 20.0–28.0)
DRAWN BY: 41977
FIO2: 50
O2 SAT: 98.9 %
PATIENT TEMPERATURE: 98.6
pCO2 arterial: 53.8 mmHg — ABNORMAL HIGH (ref 32.0–48.0)
pH, Arterial: 7.335 — ABNORMAL LOW (ref 7.350–7.450)
pO2, Arterial: 177 mmHg — ABNORMAL HIGH (ref 83.0–108.0)

## 2016-12-10 LAB — CULTURE, BLOOD (ROUTINE X 2)
CULTURE: NO GROWTH
SPECIAL REQUESTS: ADEQUATE

## 2016-12-10 LAB — BASIC METABOLIC PANEL
ANION GAP: 7 (ref 5–15)
BUN: 55 mg/dL — ABNORMAL HIGH (ref 6–20)
CHLORIDE: 119 mmol/L — AB (ref 101–111)
CO2: 30 mmol/L (ref 22–32)
Calcium: 9.1 mg/dL (ref 8.9–10.3)
Creatinine, Ser: 1.42 mg/dL — ABNORMAL HIGH (ref 0.61–1.24)
GFR calc Af Amer: >60 mL/min (ref >60)
GFR calc non Af Amer: >60 mL/min (ref >60)
Glucose, Bld: 161 mg/dL — ABNORMAL HIGH (ref 65–99)
Potassium: 4 mmol/L (ref 3.5–5.1)
Sodium: 156 mmol/L — ABNORMAL HIGH (ref 135–145)

## 2016-12-10 LAB — GLUCOSE, CAPILLARY
GLUCOSE-CAPILLARY: 126 mg/dL — AB (ref 65–99)
GLUCOSE-CAPILLARY: 143 mg/dL — AB (ref 65–99)
Glucose-Capillary: 107 mg/dL — ABNORMAL HIGH (ref 65–99)
Glucose-Capillary: 121 mg/dL — ABNORMAL HIGH (ref 65–99)
Glucose-Capillary: 162 mg/dL — ABNORMAL HIGH (ref 65–99)
Glucose-Capillary: 179 mg/dL — ABNORMAL HIGH (ref 65–99)

## 2016-12-10 LAB — CBC
HEMATOCRIT: 34 % — AB (ref 39.0–52.0)
HEMOGLOBIN: 9.7 g/dL — AB (ref 13.0–17.0)
MCH: 26.4 pg (ref 26.0–34.0)
MCHC: 28.5 g/dL — ABNORMAL LOW (ref 30.0–36.0)
MCV: 92.4 fL (ref 78.0–100.0)
Platelets: 366 10*3/uL (ref 150–400)
RBC: 3.68 MIL/uL — ABNORMAL LOW (ref 4.22–5.81)
RDW: 15.9 % — AB (ref 11.5–15.5)
WBC: 14 10*3/uL — AB (ref 4.0–10.5)

## 2016-12-10 LAB — PHOSPHORUS: PHOSPHORUS: 4.3 mg/dL (ref 2.5–4.6)

## 2016-12-10 LAB — PROCALCITONIN: Procalcitonin: 0.58 ng/mL

## 2016-12-10 LAB — MAGNESIUM: Magnesium: 2.8 mg/dL — ABNORMAL HIGH (ref 1.7–2.4)

## 2016-12-10 MED ORDER — DEXTROSE 5 % IV SOLN
0.0000 ug/min | INTRAVENOUS | Status: DC
  Administered 2016-12-10: 4 ug/min via INTRAVENOUS
  Administered 2016-12-10: 40 ug/min via INTRAVENOUS
  Administered 2016-12-10: 50 ug/min via INTRAVENOUS
  Filled 2016-12-10 (×3): qty 4

## 2016-12-10 MED ORDER — DEXTROSE 5 % IV SOLN
0.0000 ug/min | INTRAVENOUS | Status: DC
  Administered 2016-12-10: 60 ug/min via INTRAVENOUS
  Administered 2016-12-10: 55 ug/min via INTRAVENOUS
  Administered 2016-12-11 – 2016-12-12 (×8): 60 ug/min via INTRAVENOUS
  Administered 2016-12-13: 22 ug/min via INTRAVENOUS
  Administered 2016-12-13 (×2): 60 ug/min via INTRAVENOUS
  Administered 2016-12-13: 45 ug/min via INTRAVENOUS
  Administered 2016-12-14: 10 ug/min via INTRAVENOUS
  Administered 2016-12-15: 30 ug/min via INTRAVENOUS
  Filled 2016-12-10 (×20): qty 16

## 2016-12-10 MED ORDER — MIDAZOLAM HCL 2 MG/2ML IJ SOLN
2.0000 mg | INTRAMUSCULAR | Status: AC | PRN
  Administered 2016-12-11 – 2016-12-13 (×3): 2 mg via INTRAVENOUS
  Filled 2016-12-10 (×4): qty 2

## 2016-12-10 MED ORDER — MIDAZOLAM HCL 2 MG/2ML IJ SOLN
2.0000 mg | INTRAMUSCULAR | Status: DC | PRN
  Administered 2016-12-11 – 2016-12-15 (×12): 2 mg via INTRAVENOUS
  Filled 2016-12-10 (×12): qty 2

## 2016-12-10 NOTE — Progress Notes (Signed)
Transcranial Doppler  Date POD PCO2 HCT BP  MCA ACA PCA OPHT SIPH VERT Basilar  5/9 RS     Right Left * * * * * * 22 29 39 * -15 * *  5/11 JE     Right  Left   31  29   *  *   *  *   34  *   *  *   -27     *      5/14 JE     Right  Left   27  32   *  -25   *  *   44  48   50  64   *  *   *      5/16 JE     Right  Left   27  35   -25  -22   *  *   31  43   55  71   -46  *   *            Right  Left                                             Right  Left                                            Right  Left                                            Right  Left                                        MCA = Middle Cerebral Artery      OPHT = Opthalmic Artery     BASILAR = Basilar Artery   ACA = Anterior Cerebral Artery     SIPH = Carotid Siphon PCA = Posterior Cerebral Artery   VERT = Verterbral Artery                   Normal MCA = 62+\-12 ACA = 50+\-12 PCA = 42+\-23   First TCD attempt 12/01/16 with no velocities obtained due to poor windows. RS  5/9 Very limited windows. RS  5/11 Very limited test due to body habitus and facial edema. Poor windows. Very few vessels insonated. Bilateral MCA high resistant-- unsure if this is correct vessel. JE  5/14 Very limited windows. JE  5/16 Limited study due to poor windows and patient movement. JE  Farrel DemarkJill Eunice, RDMS, RVT 12/10/2016

## 2016-12-10 NOTE — Progress Notes (Signed)
No issues overnight. Pt taken off sedation.  EXAM:  BP (!) 199/82   Pulse 91   Temp (!) 102.4 F (39.1 C) (Axillary)   Resp 15   Ht 5\' 11"  (1.803 m)   Wt (!) 211.4 kg (466 lb)   SpO2 100%   BMI 64.99 kg/m   Awake, alert Breathing spontaneously  CN grossly intact  Follows commands LUE/LLE Not moving RUE/RLE to command, RN observed pt moving RLE purposefully against gravity Wound c/d/i  IMPRESSION:  36 y.o. male SAH d# 9 s/p clipping left Pcom. 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 With his weight and body habitus, I do not believe endovascular treatment is an option. Therefore, our only treatment would be hyperdynamic therapy - May also have DVT, however same issues arise with placement of IVC filter in IR  PLAN: - Have spoken with PCCM and will add fluid/pressor for goal SBP 200mmHg - Cont vent mgmt per PCCM - IR for consideration of IVC filter

## 2016-12-10 NOTE — Consult Note (Signed)
Chief Complaint: Patient was seen in consultation today for DVT  Referring Physician(s):  Dr. Wandra Scot  Supervising Physician: Oley Balm  Patient Status: [facility] - In-pt  History of Present Illness: Bryan [name] is a 36 y.o. 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 Patient also with hypoxemia and increased work of breathing requiring intubation.  Patient currently remains intubated and sedated.  Bilateral lower extremity venous duplex completed yesterday and demonstrates probable acute deep vein thrombosis.  Patient is unable to use oral anticoagulation due to recent hemorrhagic stroke. IR consulted for possible IVC filter placement.  Case reviewed by Dr. Deanne Coffer who approves procedure.    Past Medical History:  Diagnosis Date  . Complication of anesthesia    mom states they almost lost him but not sure if he was given to much medication  . Varicose veins    mutilple bleeding episodes, bilateral lower extremities)    Past Surgical History:  Procedure Laterality Date  . CRANIOTOMY Left 12/02/2016   Procedure: CRANIOTOMY FOR CLIPPING OF INTRACRANIAL ANEURYSM;  Surgeon: Lisbeth Renshaw, MD;  Location: [facility] OR;  Service: Neurosurgery;  Laterality: Left;  . IR FLUORO RM 30-60 MIN  12/02/2016  . IR US GUIDE VASC ACCESS RIGHT  12/02/2016  . lipo around heart  at age 8   pt states its bc they could hear his  heart beat  . ORIF ANKLE FRACTURE Left 09/02/2012   Procedure: OPEN REDUCTION INTERNAL FIXATION (ORIF) ANKLE FRACTURE;  Surgeon: Valeria Batman, MD;  Location: [facility] OR;  Service: Orthopedics;  Laterality: Left;  OPEN REDUCTION INTERNAL FIXATION LEFT DISTAL FIBULA FRACTURE WITH REDUCTION FIXATION DISTAL TIBIA/FIBULAR DIASTASIS   . RADIOLOGY WITH ANESTHESIA N/A 12/02/2016   Procedure: RADIOLOGY WITH ANESTHESIA;  Surgeon: Lisbeth Renshaw, MD;  Location: [facility];  Service: Radiology;  Laterality: N/A;     Allergies: Penicillins  Medications: Prior to Admission medications   Medication Sig Start Date End Date Taking? Authorizing Provider  oxyCODONE-acetaminophen (PERCOCET/ROXICET) 5-325 MG per tablet Take 1-2 tablets by mouth every 4 (four) hours as needed. Patient not taking: Reported on 01/12/2015 09/02/12   Jetty Peeks, PA-C     No family history on file.  Social History   Social History  . Marital status: Single    Spouse name: N/A  . Number of children: N/A  . Years of education: N/A   Social History Main Topics  . Smoking status: Never Smoker  . Smokeless tobacco: Current User    Types: Chew  . Alcohol use 0.0 oz/week     Comment: couple of times a week  . Drug use: No  . Sexual activity: No   Other Topics Concern  . None   Social History Narrative  . None    Review of Systems  Unable to perform ROS: Intubated    Vital Signs: BP (!) 151/76   Pulse (!) 102   Temp 100 F (37.8 C) (Rectal)   Resp 15   Ht 5\' 11"  (1.803 m)   Wt (!) 466 lb (211.4 kg)   SpO2 100%   BMI 64.99 kg/m   Physical Exam  Constitutional: He appears well-developed.  Cardiovascular: Normal rate and regular rhythm.   Pulmonary/Chest: No respiratory distress.  intubated  Neurological:  Sedated, moving all 4 extremities  Skin: Skin is warm and dry.  Nursing note and vitals reviewed.   Mallampati Score:  MD Evaluation Airway: Other (comments) Airway comments:  intubated Heart: WNL Abdomen: WNL Chest/ Lungs: WNL ASA  Classification: 3 Mallampati/Airway Score: Three  Imaging: Ct Angio Head W Or Wo Contrast  Result Date: 12/01/2016 CLINICAL DATA:  Hypertension, syncope and headache. EXAM: CT ANGIOGRAPHY HEAD TECHNIQUE: Multidetector CT imaging of the head was performed using the standard protocol during bolus administration of intravenous contrast. Multiplanar CT image reconstructions and MIPs were obtained to evaluate the vascular anatomy. CONTRAST:  100 mL Isovue 370  COMPARISON:  None. FINDINGS: CT HEAD Brain: There is diffuse subarachnoid hemorrhage over both convexities and within the sylvian fissures. Additionally, there is blood within the interpeduncular cistern and both quadrigeminal plate cisterns, as well as within the lateral ventricles. The basal cisterns are largely effaced. There is no cerebellar tonsillar herniation. No midline shift. Skull: Normal visualized skull base, calvarium and extracranial soft tissues. Sinuses/Orbits: No sinus fluid levels or advanced mucosal thickening. No mastoid effusion. Normal orbits. CTA HEAD FINDINGS Contrast bolus timing is suboptimal because the aortic arch could not be used for bolus tracking because of patient body habitus. Anterior circulation: There is an inferiorly and laterally directed outpouching from the communicating segment of the left internal carotid artery, at the origin of the left p-comm, that measures 3 mm at its neck and 5 mm base to apex. This is best seen on series 22, image 91 and series 23 images 116 and 117. The anterior circulation is patent without high-grade stenosis. Posterior circulation: There is poor opacification of the basilar artery, which may be congenital variation due to the presence of bilateral posterior communicating arteries. This also could be an artifact bolus timing. The posterior circulation is otherwise normal. Venous sinuses: As permitted by contrast timing, patent. Anatomic variants: None Delayed phase: No parenchymal contrast enhancement. Review of the MIP images confirms the above findings IMPRESSION: 1. Diffuse subarachnoid hemorrhage over both convexities and extending into the basal cisterns, which are markedly narrowed. Critical Value/emergent results were called by telephone at the time of interpretation on 12/01/2016 at 8:05 pm to Dr. Erin HearingMESSNER, who verbally acknowledged these results. 2. CTA images are degraded by poor bolus timing, as patient body habitus prevented use of the  aortic arch for bolus tracking. 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 Critical Value/emergent results were called by telephone at the time of interpretation on 12/01/2016 at 8:47 pm to Manus RuddSchulz, NP who verbally acknowledged these results. 3. 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 Electronically Signed   By: Deatra RobinsonKevin  Herman M.D.   On: 12/01/2016 20:49   Ct Cervical Spine Wo Contrast  Result Date: 12/01/2016 CLINICAL DATA:  Hypertension and syncope EXAM: CT CERVICAL SPINE WITHOUT CONTRAST TECHNIQUE: Multidetector CT imaging of the cervical spine was performed without intravenous contrast. Multiplanar CT image reconstructions were also generated. COMPARISON:  None. FINDINGS: Assessment of the cervical spine is severely degraded by beam attenuation secondary to patient body habitus. Within that limitation, the alignment of the cervical spine is normal. There is no visible fracture. There is an azygos lobe on the right, that extends above the thoracic inlet. IMPRESSION: 1. Markedly degraded examination of the cervical spine due to beam attenuation. Within that limitation, no acute fracture or static subluxation is visualized. 2. Right  lung azygos lobe which extends above the thoracic inlet. This is likely normal anatomic variation. Correlation with chest radiographs could be considered. Electronically Signed   By: Deatra Robinson M.D.   On: 12/01/2016 20:31   Ir Fluoro Rm 30-60 Min  Result Date: 12/08/2016 PROCEDURE: DIAGNOSTIC CEREBRAL ANGIOGRAM - ATTEMPTED HISTORY: The patient is a morbidly obese 36 year old man presenting with sudden onset of severe headache. CT scan demonstrated  diffuse basal subarachnoid hemorrhage. He therefore presents for attempt at diagnostic cerebral angiogram and possible treatment. ACCESS: The technical aspects of the procedure as well as its potential risks and benefits were reviewed with the patient. These risks included but were not limited bleeding, infection, allergic reaction, damage to organs / vital structures, stroke, non-diagnostic procedure, and the catastrophic outcomes of heart attack, coma, and death. With an understanding of these risks, informed consent was obtained and witnessed. The patient was placed in the supine position on the angiography table and the skin of right groin prepped in the usual sterile fashion. Due to the patient's size, we were unable to utilize lateral fluoroscopy. Ultrasound was used to identify the right superficial femoral artery. A large spinal needle rather than the usual micro puncture access needle was used to gain access to the superficial femoral artery on the right side with ultrasound guidance. 0.018" wire was introduced into the superficial femoral artery, and advanced into the distal aorta. Multiple attempts were used to then dilates subcutaneous tissue and the SFA, however multiple attempts at placement of a sheath resulted in kinking. After several attempts at access with the same result, the decision was made to abort the procedure at this point. MEDICATIONS: HEPARIN: 0 units total. CONTRAST:  cc, Omnipaque 300 FLUOROSCOPY TIME:  FLUOROSCOPY TIME: 0.8 combined AP and lateral minutes DISPOSITION: After the decision was made to abort the procedure, the wire access was removed, and hemostasis was achieved with direct manual compression. No hematoma was seen. The patient was then transferred to the operating room for further treatment. IMPRESSION: 1. Aborted attempt at diagnostic angiogram due to inability to gain safe femoral access due to the patient's body habitus. The preliminary results of this procedure were  shared with the patient and the patient's family. Electronically Signed   By: Lisbeth Renshaw   On: 12/08/2016 15:21   Ir US Guide Vasc Access Right  Result Date: 12/08/2016 PROCEDURE: DIAGNOSTIC CEREBRAL ANGIOGRAM - ATTEMPTED HISTORY: The patient is a morbidly obese 36 year old man presenting with sudden onset of severe headache. CT scan demonstrated diffuse basal subarachnoid hemorrhage. He therefore presents for attempt at diagnostic cerebral angiogram and possible treatment. ACCESS: The technical aspects of the procedure as well as its potential risks and benefits were reviewed with the patient. These risks included but were not limited bleeding, infection, allergic reaction, damage to organs / vital structures, stroke, non-diagnostic procedure, and the catastrophic outcomes of heart attack, coma, and death. With an understanding of these risks, informed consent was obtained and witnessed. The patient was placed in the supine position on the angiography table and the skin of right groin prepped in the usual sterile fashion. Due to the patient's size, we were unable to utilize lateral fluoroscopy. Ultrasound was used to identify the right superficial femoral artery. A large spinal needle rather than the usual micro puncture access needle was used to gain access to the superficial femoral artery on the right side with ultrasound guidance. 0.018" wire was introduced into the superficial femoral artery, and advanced into the distal aorta. Multiple  attempts were used to then dilates subcutaneous tissue and the SFA, however multiple attempts at placement of a sheath resulted in kinking. After several attempts at access with the same result, the decision was made to abort the procedure at this point. MEDICATIONS: HEPARIN: 0 units total. CONTRAST:  cc, Omnipaque 300 FLUOROSCOPY TIME:  FLUOROSCOPY TIME: 0.8 combined AP and lateral minutes DISPOSITION: After the decision was made to abort the procedure, the wire  access was removed, and hemostasis was achieved with direct manual compression. No hematoma was seen. The patient was then transferred to the operating room for further treatment. IMPRESSION: 1. Aborted attempt at diagnostic angiogram due to inability to gain safe femoral access due to the patient's body habitus. The preliminary results of this procedure were shared with the patient and the patient's family. Electronically Signed   By: Lisbeth Renshaw   On: 12/08/2016 15:21   Dg Chest Port 1 View  Result Date: 12/10/2016 CLINICAL DATA:  Check endotracheal tube placement EXAM: PORTABLE CHEST 1 VIEW COMPARISON:  12/09/2016 FINDINGS: Cardiac shadow is mildly prominent but accentuated by the portable technique. Endotracheal tube and feeding catheter are again noted and stable. A right-sided PICC line is noted in satisfactory position. Mild vascular congestion is noted without focal confluent infiltrate. The previously seen left-sided infiltrate has significantly improved in the interval. No bony abnormality is noted. IMPRESSION: Persistent vascular congestion. Tubes and lines as described. Improved infiltrative change on the left. Electronically Signed   By: Alcide Clever M.D.   On: 12/10/2016 07:26   Dg Chest Port 1 View  Result Date: 12/09/2016 CLINICAL DATA:  Hypoxia EXAM: PORTABLE CHEST 1 VIEW COMPARISON:  Study obtained earlier in the day FINDINGS: Central catheter tip is in the superior vena cava with the tip slightly above the level of the carina. Endotracheal tube tip is 5.6 cm above the carina. Feeding tube tip is below the diaphragm. No pneumothorax. There is extensive airspace consolidation on the left, stable. Right lung is clear. There is mild cardiomegaly with pulmonary venous hypertension. No adenopathy. No bone lesions. IMPRESSION: Central catheter tip now in superior vena cava. Endotracheal tube as described. Feeding tube tip below the diaphragm. No pneumothorax. Suspect multifocal pneumonia  on the left with widespread airspace consolidation on the left. Right lung clear. Stable cardiac prominence. There may be a degree of underlying pulmonary venous hypertension suggesting underlying pulmonary vascular congestion. Electronically Signed   By: Bretta Bang III M.D.   On: 12/09/2016 13:00   Dg Chest Port 1 View  Result Date: 12/09/2016 CLINICAL DATA:  Acute respiratory failure with hypoxia EXAM: PORTABLE CHEST 1 VIEW COMPARISON:  Portable chest x-ray of Dec 07, 2016. FINDINGS: There has been further deterioration in the appearance of the left hemithorax with only a very small amount of aerated lung parenchyma present. The left heart border is obscured. On the right the lung is adequately inflated. The pulmonary vascularity is engorged. The endotracheal tube tip lies 4.7 cm above the carina. The feeding tube tip projects below the inferior margin of the image. The PICC line tip crosses the midline from right to left and appears to terminate in the distal aspect of the left subclavian vein or junction of the left subclavian vein with the left internal jugular vein. IMPRESSION: Interval worsening in the appearance of the left lung compatible with atelectasis or pneumonia or less likely asymmetric pulmonary edema. There is pulmonary vascular congestion on the right more conspicuous today without significant edema. Repositioning of the PICC  line is recommended to assure that it lies within the superior vena cava. Electronically Signed   By: David  [place]az[place]and M.D.   On: 12/09/2016 07:33   Dg Chest Port 1 View  Result Date: 12/07/2016 CLINICAL DATA:  Acute respiratory failure. EXAM: PORTABLE CHEST 1 VIEW COMPARISON:  Chest x-rays dated 12/06/2016, 12/05/2016 and 12/02/2016. FINDINGS: Endotracheal tube is appropriately positioned approximately 4 cm above the carina. Enteric tube passes below the diaphragm. Right-sided PICC line appears appropriately positioned with tip at the level of the lower SVC.  Stable cardiomegaly. Persistent central pulmonary vascular congestion and bilateral interstitial edema. Improved aeration at the left lung base. No pneumothorax seen. IMPRESSION: Cardiomegaly with persistent central pulmonary vascular congestion and mild bilateral interstitial edema suggesting some degree of CHF/volume overload, stable compared to yesterday's exam. Improved aeration at the left lung base, likely decreased atelectasis. Electronically Signed   By: Bary Richard M.D.   On: 12/07/2016 09:38   Dg Chest Port 1 View  Result Date: 12/06/2016 CLINICAL DATA:  Ventilator dependent.  Craniotomy for aneurysm EXAM: PORTABLE CHEST 1 VIEW COMPARISON:  12/05/2016 FINDINGS: Endotracheal tube in good position. Feeding tube in place with the tip not visualized. Right arm PICC tip not well visualized but enters the SVC. Hypoventilation with bibasilar atelectasis left greater than right appears unchanged. No new findings. IMPRESSION: Endotracheal tube in good position. Extensive bibasilar atelectasis left greater than right. Electronically Signed   By: Marlan Palau M.D.   On: 12/06/2016 07:36   Dg Chest Port 1 View  Result Date: 12/05/2016 CLINICAL DATA:  Ventilator dependence. EXAM: PORTABLE CHEST 1 VIEW COMPARISON:  One-view chest x-ray 12/04/2016. FINDINGS: The endotracheal tube and right-sided PICC line are stable. An NG tube courses off the inferior border of the film. The heart is enlarged. Diffuse interstitial and airspace disease is similar to the prior exam. Bilateral effusions are suspected. Detail is obscured by the overlying cooling blanket. IMPRESSION: 1. Cardiomegaly with stable diffuse interstitial and airspace disease likely reflecting edema and congestive heart failure. 2. Support apparatus is stable. Electronically Signed   By: Marin Roberts M.D.   On: 12/05/2016 07:19   Dg Chest Port 1 View  Result Date: 12/04/2016 CLINICAL DATA:  Ventilator dependent. EXAM: PORTABLE CHEST 1 VIEW  COMPARISON:  12/03/2016 FINDINGS: Endotracheal tube terminates at the level of the clavicles, unchanged. A right PICC has been placed and likely terminates over the lower SVC/ cavoatrial junction region, although the tip is poorly visualized due to underpenetration. Enteric tube courses into the left upper abdomen with tip not imaged. The cardiac silhouette remains enlarged. Lung volumes are diminished with mild worsening of perihilar and bibasilar airspace opacities. There may be a small left pleural effusion. No pneumothorax is seen. IMPRESSION: 1. Interval right PICC placement as above. 2. Cardiomegaly with increasing bilateral airspace opacities which may reflect edema and atelectasis. Electronically Signed   By: Sebastian Ache M.D.   On: 12/04/2016 07:54   Portable Chest Xray  Result Date: 12/03/2016 CLINICAL DATA:  Respiratory failure.  Hypoxia. EXAM: PORTABLE CHEST 1 VIEW COMPARISON:  12/02/2016 FINDINGS: Endotracheal tube in good position. Enteric catheter tip collimated off the image. The cardiac silhouette is enlarged. Mediastinal contours appear intact. Low lung volumes with prominence of the interstitium. More focal airspace consolidation the left lower lobe cannot be excluded. Osseous structures are without acute abnormality. Soft tissues are grossly normal. IMPRESSION: Low lung volumes with interstitial pulmonary edema. Left lower lobe airspace consolidation versus atelectasis/pleural effusion. Stably enlarged cardiac silhouette.  Electronically Signed   By: Ted Mcalpine M.D.   On: 12/03/2016 07:27   Dg Chest Port 1 View  Result Date: 12/02/2016 CLINICAL DATA:  Intubation an orogastric tube placement. EXAM: PORTABLE CHEST 1 VIEW COMPARISON:  Chest x-ray report dated November 22, 2015 FINDINGS: The endotracheal tube tip lies 3.6 cm above the carina. The esophagogastric tube tip lies in the gastric cardia having coiled first in the proximal gastric body. The lungs are hypoinflated. The pulmonary  vascularity is engorged. The cardiac silhouette is enlarged. There is small right pleural effusion. IMPRESSION: The positioning of the endotracheal tube is good. The tip of the esophagogastric tube lies in the gastric cardia but there is ample tubing within the stomach which may allow the tip to proceed more distally. Follow-up radiographs of the upper abdomen would be useful. Electronically Signed   By: David  [place] M.D.   On: 12/02/2016 11:47   Dg Abd Portable 1v  Result Date: 12/02/2016 CLINICAL DATA:  Orogastric tube placement EXAM: PORTABLE ABDOMEN - 1 VIEW COMPARISON:  Portable exam 1106 hours without priors for comparison FINDINGS: Gastric tube coiled in proximal stomach. Visualized bowel gas pattern normal. No acute osseous findings. IMPRESSION: Gastric tube coiled in proximal stomach. Electronically Signed   By: Ulyses Southward M.D.   On: 12/02/2016 11:45    Labs:  CBC:  Recent Labs  12/07/16 0538 12/08/16 0430 12/09/16 0501 12/10/16 0500  WBC 15.5* 15.2* 15.5* 14.0*  HGB 10.2* 10.7* 9.5* 9.7*  HCT 33.5* 34.7* 32.2* 34.0*  PLT 319 332 333 366    COAGS:  Recent Labs  12/01/16 2343  INR 1.02  APTT 29    BMP:  Recent Labs  12/07/16 0538 12/08/16 0430 12/09/16 0501 12/10/16 0500  NA 144 149* 152* 156*  K 3.6 3.6 3.4* 4.0  CL 109 110 115* 119*  CO2 27 29 30 30   GLUCOSE 142* 136* 129* 161*  BUN 50* 51* 40* 55*  CALCIUM 8.8* 8.8* 8.3* 9.1  CREATININE 1.53* 1.28* 0.95 1.42*  GFRNONAA 57* >60 >60 >60  GFRAA >60 >60 >60 >60    LIVER FUNCTION TESTS: No results for input(s): BILITOT, AST, ALT, ALKPHOS, PROT, ALBUMIN in the last 8760 hours.  TUMOR MARKERS: No results for input(s): AFPTM, CEA, CA199, CHROMGRNA in the last 8760 hours.  Assessment and Plan: DVT Patient with SAH s/p clipping now with probably acute LE DVT.  IR consulted for IVC filter placement per CCM.  Dr. Deanne Coffer has reviewed case and determined to be appropriate.   Will need to coordinate with  IR staff as well as respiratory, but hopeful for procedure today.  Risks and benefits discussed with the patient including, but not limited to bleeding, infection, contrast induced renal failure, filter fracture or migration which can lead to emergency surgery or even death, strut penetration with damage or irritation to adjacent structures and caval thrombosis. All of the patient's questions were answered, patient is agreeable to proceed. Consent signed and in chart.  Thank you for this interesting consult.  I greatly enjoyed meeting TAMARIUS ROSENFIELD and look forward to participating in their care.  A copy of this report was sent to the requesting provider on this date.  Electronically Signed: Hoyt Koch 12/10/2016, 11:10 AM   I spent a total of 40 Minutes    in face to face in clinical consultation, greater than 50% of which was counseling/coordinating care for DVT

## 2016-12-10 NOTE — Progress Notes (Signed)
Patient planning for IVC filter placement by IR. Weight per bedscale has been highly variable ranging from 368 lbs to 514 lbs; patient has been consistently documented at 450-470lbs.  Discussed with RN who attempted using lift to zero bedscale and accurately obtain weight, however this was not successful.  Discussed with IR team. Due to limitations of equipment, precautions are needed to ensure procedure is accomplished successfully and safely. Discussed with Dr. Deanne CofferHassell and Dr. Fredia SorrowYamagata who recommend repeating lower extremity venous duplex to confirm patient does have an acute DVT and is in need of IVC filter as preliminary results suggest probable DVTs. Paged Dr. Molli KnockYacoub to inform of status of the procedure and recommendation of MD.    Loyce DysKacie Nicholis Stepanek, MMS RDN PA-C 4:02 PM

## 2016-12-10 NOTE — Progress Notes (Signed)
PULMONARY / CRITICAL CARE MEDICINE   Name: Kem KaysDarren D Vandergrift MRN: [id number] DOB: [birth year]    ADMISSION DATE:  12/01/2016 CONSULTATION DATE:  12/02/2016  REFERRING MD:  Dr. Patric DykesNunkumar   CHIEF COMPLAINT:  SAH  BRIEF SUMMARY:  36 y.o.malewith PMH of morbid obesity, varicose veins who [facility] with sudden onset of headache that started earlier that morning. CTA demonstrated diffuse SAH over both convexities and extending into the basal cisterns. Admitted by neurosurgery with plans for angiogram 5/8 with possible clipping.  Subsequently developed worsening hypoxemia and increased WOB requiring intubation.    SUBJECTIVE:  Notes reflect LE Duplex with concern for probable acute DVT in left popliteal vein, left posterior tibial veins.  Tmax 100.3.  WBC 14, ABG 7.335 / 53.8 / 177, sr cr up to 1.42 (I/O neg 997 in last 24 hours).  Sedation turned off > pt wiggles toes to command, blinks in response to questions 1 yes/2 no.    VITAL SIGNS: BP (!) 144/77   Pulse 92   Temp 100 F (37.8 C) (Rectal)   Resp 13   Ht 5\' 11"  (1.803 m)   Wt (!) 466 lb (211.4 kg)   SpO2 100%   BMI 64.99 kg/m   HEMODYNAMICS:    VENTILATOR SETTINGS: Vent Mode: Bi-Vent FiO2 (%):  [50 %-90 %] 50 % Set Rate:  [30 bmp] 30 bmp Vt Set:  [600 mL] 600 mL PEEP:  [0 cmH20-10 cmH20] 0 cmH20 Pressure Support:  [15 cmH20] 15 cmH20 Plateau Pressure:  [26 cmH20-30 cmH20] 26 cmH20  INTAKE / OUTPUT: I/O last 3 completed shifts: In: 3282.7 [I.V.:1432.7; Other:400; NG/GT:1350; IV Piggyback:100] Out: 7125 [Urine:5825; Stool:1300]  PHYSICAL EXAMINATION: General: morbidly obese male in NAD on vent HEENT: MM pink/moist, ETT, tongue swollen/protruding, left forehead dressing c/d/i  Neuro: eyes open off sedation, blinks yes/no, wiggles LLE toes, no follow commands on any other extremity  CV: s1s2 rrr, no m/r/g PULM: even/non-labored, lungs bilaterally coarse  ZD:GUYQGI:soft, non-tender, bsx4 active  Extremities: warm/dry,  trace generalized edema  Skin: no rashes or lesions   LABS:  BMET  Recent Labs Lab 12/08/16 0430 12/09/16 0501 12/10/16 0500  NA 149* 152* 156*  K 3.6 3.4* 4.0  CL 110 115* 119*  CO2 29 30 30   BUN 51* 40* 55*  CREATININE 1.28* 0.95 1.42*  GLUCOSE 136* 129* 161*   Electrolytes  Recent Labs Lab 12/06/16 0508  12/07/16 0538 12/08/16 0430 12/09/16 0501 12/10/16 0500  CALCIUM 8.9  < > 8.8* 8.8* 8.3* 9.1  MG 2.2  --  2.5* 2.8* 2.4 2.8*  PHOS 3.1  --  3.7  --   --  4.3  < > = values in this interval not displayed.  CBC  Recent Labs Lab 12/08/16 0430 12/09/16 0501 12/10/16 0500  WBC 15.2* 15.5* 14.0*  HGB 10.7* 9.5* 9.7*  HCT 34.7* 32.2* 34.0*  PLT 332 333 366   Coag's No results for input(s): APTT, INR in the last 168 hours. Sepsis Markers  Recent Labs Lab 12/07/16 0538 12/09/16 0918 12/10/16 0500  PROCALCITON 0.42 0.45 0.58   ABG  Recent Labs Lab 12/09/16 0416 12/09/16 1118 12/10/16 0459  PHART 7.357 7.371 7.335*  PCO2ART 55.5* 58.2* 53.8*  PO2ART 72.1* 116.0* 177*   Liver Enzymes No results for input(s): AST, ALT, ALKPHOS, BILITOT, ALBUMIN in the last 168 hours.  Cardiac Enzymes No results for input(s): TROPONINI, PROBNP in the last 168 hours.  Glucose  Recent Labs Lab 12/09/16 0750 12/09/16  1124 12/09/16 1608 12/09/16 2009 12/09/16 2348 12/10/16 0344  GLUCAP 118* 151* 113* 124* 126* 107*   Imaging CXR 5/15 >> interval worsening of left lung opacity, edema vs opacity/LLL  STUDIES: CTA head 5/7 >> diffuse SAH over both convexities and extending into the basal cisterns. CXR 5/10 >> Cardiomegaly with increasing bilateral airspace opacities which may reflect edema/atelectasis  ECHO 5/10 >> EF of 65-70% and LVH, poor image to determine diastolic dysfunction Transcranial dopplers 5/11 >> poor study LE Doppler 5/15 >> prelim results with concern for probable acute DVT in left popliteal vein, left posterior tibial veins     CULTURES: MRSA 5/8 > Negative  Blood 5/11 >> 1 of 2 GPC >> Coag-negative staph Tracheal aspirate 5/15 >>  [facility] 5/15 >>   ANTIBIOTICS: Vancomycin 1 given 5/12 Cefepime 5/15 (? New L infiltrate) >>   SIGNIFICANT EVENTS: 5/07  Admit with sudden headache, SAH. 5/08  PCCM consult > intubation >clipping of posterior communicating artery aneurysm  5/12  Vanc x1 for 1/2 coag neg staph, lasix gtt decreased, desats with WUA 5/13  Lasix gtt discontinued 5/14  Fever overnight to 102.8, pink frothy sputum late evening, difficulty oxygenating  5/15  LE duplex, concern for possible popliteal & tibial DVT   LINES/TUBES: ETT 5/8 >> Right upper extremity PICC 5/9 >>   DISCUSSION: 36 y.o.maleadmitted 5/7 with SAH. Required intubation 5/8 due to hypoxemia and increased WOB.  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  Will work on arranging.  Hold lasix 5/16  CARDIOVASCULAR A:  HTN P:  ICU monitoring of hemodynamics SBP goal <160 Continue metoprolol 50 mg PT PRN hydralazine, labetalol  Monitor I/O"s Nimodipine as above  RENAL A:   Acute Kidney Injury (Base Crt 0.89) - note received single dose vancomycin 5/12, improved Hypernatremia - mild Hypokalemia  Volume Overload P:   Trend BMP / urinary output Replace electrolytes as indicated Avoid nephrotoxic agents,  ensure adequate renal perfusion Hold lasix 5/16  Free water 200 ml PT Q8  GASTROINTESTINAL A:   Morbid Obesity  Nutrition  GI Prophylaxis  P:   PPI for SUP  TF per Nutrition   HEMATOLOGIC A:   VTE Prophylaxis  Possible LLE DVT - see preliminary report  P:  Follow CBC SCD's for DVT prophylaxis  IR consulted for IVC filter  No anticoagulants given ICH   INFECTIOUS A:   Leukocytosis  Fever - 5/10, 5/15 Coag negative staph in one of 2 blood cultures, presumed skin contaminant P:   Continue cefepime, D2/x  Follow fever curve / WBC trend  Monitor off abx CXR improved 5/16 > doubt PNA, feel more likely de-recruitment / atelectasis + edema  ENDOCRINE A:   Hyperglycemia   P:   Follow glucose on BMP    FAMILY  - Updates:  Mother updated on 5/16.  Not available on  am NP rounding.  Will update on arriva.   - Inter-disciplinary family meet or Palliative Care meeting due by: 5/15   CC Time: 30 minutes   Canary Brim, NP-C [facility] Pgr: [phone] or if no answer [phone] 12/10/2016, 7:46 AM  Attending Note:  36 year old male who is super morbidly obese with heart failure and respiratory failure s/p SAH requiring surgical intervention.  Patient responded to APRV and O2 demand has improved.  However, this AM patient is not moving his right side effectively on exam.  I reviewed CXR myself, ETT in good position.  No family bedside.  Discussed with NS.  Will start levophed with target SBP of 200 per neurosurgery.  Will not diurese any further at this point.  DVTs noted on U/S, not a candidate for heparin, will ask IR to place IVC filter.  Continue APRV.  The patient is critically ill with multiple organ systems failure and requires high complexity decision making for assessment and support, frequent evaluation and titration of therapies, application of advanced monitoring technologies and extensive interpretation of multiple databases.   Critical Care Time  devoted to patient care services described in this note is  35  Minutes. This time reflects time of care of this signee Dr Koren Bound. This critical care time does not reflect procedure time, or teaching time or supervisory time of PA/NP/Med student/Med Resident etc but could involve care discussion time.  Alyson Reedy, M.D. [facility]monary/Critical Care Medicine. Pager: [phone]. After hours pager: [phone].

## 2016-12-11 ENCOUNTER — Inpatient Hospital Stay (HOSPITAL_COMMUNITY): Payer: BLUE CROSS/BLUE SHIELD

## 2016-12-11 DIAGNOSIS — I959 Hypotension, unspecified: Secondary | ICD-10-CM

## 2016-12-11 HISTORY — PX: IR IVC FILTER PLMT / S&I /IMG GUID/MOD SED: IMG701

## 2016-12-11 LAB — CBC
HCT: 35.4 % — ABNORMAL LOW (ref 39.0–52.0)
Hemoglobin: 10.2 g/dL — ABNORMAL LOW (ref 13.0–17.0)
MCH: 26.4 pg (ref 26.0–34.0)
MCHC: 28.8 g/dL — ABNORMAL LOW (ref 30.0–36.0)
MCV: 91.7 fL (ref 78.0–100.0)
Platelets: 412 10*3/uL — ABNORMAL HIGH (ref 150–400)
RBC: 3.86 MIL/uL — AB (ref 4.22–5.81)
RDW: 15.9 % — ABNORMAL HIGH (ref 11.5–15.5)
WBC: 17.7 10*3/uL — AB (ref 4.0–10.5)

## 2016-12-11 LAB — BLOOD GAS, ARTERIAL
Acid-Base Excess: 2.8 mmol/L — ABNORMAL HIGH (ref 0.0–2.0)
Bicarbonate: 27.7 mmol/L (ref 20.0–28.0)
Drawn by: 419771
FIO2: 40
O2 SAT: 98.1 %
PCO2 ART: 49.8 mmHg — AB (ref 32.0–48.0)
PO2 ART: 128 mmHg — AB (ref 83.0–108.0)
Patient temperature: 98.6
pH, Arterial: 7.365 (ref 7.350–7.450)

## 2016-12-11 LAB — BASIC METABOLIC PANEL
Anion gap: 11 (ref 5–15)
BUN: 37 mg/dL — ABNORMAL HIGH (ref 6–20)
CALCIUM: 9 mg/dL (ref 8.9–10.3)
CO2: 27 mmol/L (ref 22–32)
Chloride: 116 mmol/L — ABNORMAL HIGH (ref 101–111)
Creatinine, Ser: 1.26 mg/dL — ABNORMAL HIGH (ref 0.61–1.24)
GFR calc non Af Amer: >60 mL/min (ref >60)
Glucose, Bld: 259 mg/dL — ABNORMAL HIGH (ref 65–99)
Potassium: 3.7 mmol/L (ref 3.5–5.1)
SODIUM: 154 mmol/L — AB (ref 135–145)

## 2016-12-11 LAB — GLUCOSE, CAPILLARY
GLUCOSE-CAPILLARY: 178 mg/dL — AB (ref 65–99)
Glucose-Capillary: 151 mg/dL — ABNORMAL HIGH (ref 65–99)
Glucose-Capillary: 156 mg/dL — ABNORMAL HIGH (ref 65–99)
Glucose-Capillary: 157 mg/dL — ABNORMAL HIGH (ref 65–99)
Glucose-Capillary: 200 mg/dL — ABNORMAL HIGH (ref 65–99)

## 2016-12-11 LAB — PROCALCITONIN: Procalcitonin: 0.41 ng/mL

## 2016-12-11 LAB — MAGNESIUM: MAGNESIUM: 2.6 mg/dL — AB (ref 1.7–2.4)

## 2016-12-11 LAB — PHOSPHORUS: Phosphorus: 3.6 mg/dL (ref 2.5–4.6)

## 2016-12-11 MED ORDER — SODIUM CHLORIDE 0.9 % IV SOLN
0.0000 ug/min | INTRAVENOUS | Status: DC
  Administered 2016-12-11: 20 ug/min via INTRAVENOUS
  Administered 2016-12-11 – 2016-12-12 (×2): 30 ug/min via INTRAVENOUS
  Administered 2016-12-12: 100 ug/min via INTRAVENOUS
  Administered 2016-12-12: 170 ug/min via INTRAVENOUS
  Administered 2016-12-12: 60 ug/min via INTRAVENOUS
  Administered 2016-12-12: 160 ug/min via INTRAVENOUS
  Administered 2016-12-12: 130 ug/min via INTRAVENOUS
  Filled 2016-12-11 (×10): qty 1

## 2016-12-11 MED ORDER — SODIUM CHLORIDE 0.9 % IV SOLN
INTRAVENOUS | Status: DC | PRN
  Administered 2016-12-12: 01:00:00 via INTRA_ARTERIAL

## 2016-12-11 MED ORDER — IOPAMIDOL (ISOVUE-300) INJECTION 61%
INTRAVENOUS | Status: AC
  Administered 2016-12-11: 40 mL
  Filled 2016-12-11: qty 100

## 2016-12-11 MED ORDER — LIDOCAINE HCL (PF) 1 % IJ SOLN
INTRAMUSCULAR | Status: DC | PRN
  Administered 2016-12-11: 15 mL

## 2016-12-11 MED ORDER — LIDOCAINE HCL 1 % IJ SOLN
INTRAMUSCULAR | Status: AC
  Filled 2016-12-11: qty 20

## 2016-12-11 NOTE — Progress Notes (Signed)
eLink Physician-Brief Progress Note Patient Name: Bryan KaysDarren D Pelster DOB: March 30, 1981 MRN: [id number]   Date of Service  12/11/2016  HPI/Events of Note  Agitation - Pulled out NGT. Nurse request for bilateral soft wrist restraints.   eICU Interventions  Will order bilateral soft wrist restraints.      Intervention Category Minor Interventions: Agitation / anxiety - evaluation and management  Lenell AntuSommer,Agapito Hanway Eugene 12/11/2016, 4:39 PM

## 2016-12-11 NOTE — Progress Notes (Signed)
Patient transported to IR and returned to 4N20 without complications. Vital signs stable at this time. RN at bedside. RT will continue to monitor.

## 2016-12-11 NOTE — Progress Notes (Signed)
BP cuff pressures very inconsistent do to patient movement. Titrating both Neo and Levo to keep SBP > 200.  Nundkumar made aware and he ordered an Art Line to be placed.  Two RTs tried and were not successful.  They will have PM shift RTs try tonight.

## 2016-12-11 NOTE — Progress Notes (Signed)
No issues overnight.  EXAM:  BP (!) 176/85   Pulse 80   Temp 99.3 F (37.4 C)   Resp 19   Ht 5\' 11"  (1.803 m)   Wt (!) 460 lb (208.7 kg)   SpO2 100%   BMI 64.16 kg/m   Awake, alert,  Breathing spontaneously CN grossly intact  Moves Left side well Antigravity RLE, moves RUE at least 2/5 to command   IMPRESSION:  36 y.o. male SAH d# 10 s/p left Pcom clipping. Right hemiparesis appears improved with hyperdynamic treatment.  VDRF DVT  PLAN: - Cont hyperdynamic therapy with SBP goal 200mmHg - Likely trach with ENT - May need IVC filter

## 2016-12-11 NOTE — Progress Notes (Signed)
eLink Physician-Brief Progress Note Patient Name: Kem KaysDarren D Shakespeare DOB: 19-Aug-1980 MRN: [id number]   Date of Service  12/11/2016  HPI/Events of Note  Patient is currently on Norepinephrine and Phenylephrine [facility]. Patient is also on Metoprolol orally.   eICU Interventions  Will D/C Metoprolol.     Intervention Category Major Interventions: Hypotension - evaluation and management  Sommer,Steven Eugene 12/11/2016, 10:30 PM

## 2016-12-11 NOTE — Progress Notes (Signed)
eLink Physician-Brief Progress Note Patient Name: Bryan [name] DOB: September 17, 1980 MRN: [id number]   Date of Service  12/11/2016  HPI/Events of Note  Tachypnea and poor tolerance of current vent settings  eICU Interventions  Changed to PCV mode     Intervention Category Major Interventions: Respiratory failure - evaluation and management  Merwyn KatosDavid B Ameka Krigbaum 12/11/2016, 5:09 AM

## 2016-12-11 NOTE — Consult Note (Signed)
Reason for Consult:tracheostomy consult Referring Physician: Consuella Lose, MD  Bryan [name] is an 36 y.o. male.  HPI: he suffered a significant subarachnoid hemorrhage. He has been intubated since May 8. He has not been able to wean.He suffers with morbid obesity. He is also suffering with a deep vein thrombosis and cannot be anticoagulated. Interventional radiology has been consulted for possible IVC filter placement but it would have to be done in the operating room due to his size.  Past Medical History:  Diagnosis Date  . Complication of anesthesia    mom states they almost lost him but not sure if he was given to much medication  . Varicose veins    mutilple bleeding episodes, bilateral lower extremities)    Past Surgical History:  Procedure Laterality Date  . CRANIOTOMY Left 12/02/2016   Procedure: CRANIOTOMY FOR CLIPPING OF INTRACRANIAL ANEURYSM;  Surgeon: Consuella Lose, MD;  Location: [place];  Service: Neurosurgery;  Laterality: Left;  . IR FLUORO RM 30-60 MIN  12/02/2016  . IR US GUIDE VASC ACCESS RIGHT  12/02/2016  . lipo around heart  at age 38   pt states its bc they could hear his  heart beat  . ORIF ANKLE FRACTURE Left 09/02/2012   Procedure: OPEN REDUCTION INTERNAL FIXATION (ORIF) ANKLE FRACTURE;  Surgeon: Garald Balding, MD;  Location: [place];  Service: Orthopedics;  Laterality: Left;  OPEN REDUCTION INTERNAL FIXATION LEFT DISTAL FIBULA FRACTURE WITH REDUCTION FIXATION DISTAL TIBIA/FIBULAR DIASTASIS   . RADIOLOGY WITH ANESTHESIA N/A 12/02/2016   Procedure: RADIOLOGY WITH ANESTHESIA;  Surgeon: Consuella Lose, MD;  Location: [place];  Service: Radiology;  Laterality: N/A;    No family history on file.  Social History:  reports that he has never smoked. His smokeless tobacco use includes Chew. He reports that he drinks alcohol. He reports that he does not use drugs.  Allergies:  Allergies  Allergen Reactions  . Penicillins Hives and Other (See Comments)    Mouth  blisters - childhood allergy Has patient had a PCN reaction causing immediate rash, facial/tongue/throat swelling, SOB or lightheadedness with hypotension: Yes Has patient had a PCN reaction causing severe rash involving mucus membranes or skin necrosis: No Has patient had a PCN reaction that required hospitalization Yes Has patient had a PCN reaction occurring within the last 10 years: No If all of the above answers are "NO", then may proceed with Cephalosporin use.    Medications: Reviewed  Results for orders placed or performed during the hospital encounter of 12/01/16 (from the past 48 hour(s))  Culture, respiratory (NON-Expectorated)     Status: None (Preliminary result)   Collection Time: 12/09/16  1:50 PM  Result Value Ref Range   Specimen Description TRACHEAL ASPIRATE    Special Requests NONE    Gram Stain      FEW WBC PRESENT,BOTH PMN AND MONONUCLEAR FEW GRAM POSITIVE COCCI IN PAIRS    Culture CULTURE REINCUBATED FOR BETTER GROWTH    Report Status PENDING   Culture, blood (Routine X 2) w Reflex to ID Panel     Status: None (Preliminary result)   Collection Time: 12/09/16  3:20 PM  Result Value Ref Range   Specimen Description BLOOD LEFT HAND    Special Requests IN PEDIATRIC BOTTLE Blood Culture adequate volume    Culture NO GROWTH 2 DAYS    Report Status PENDING   Culture, blood (Routine X 2) w Reflex to ID Panel     Status: None (Preliminary result)  Collection Time: 12/09/16  3:20 PM  Result Value Ref Range   Specimen Description BLOOD LEFT ARM    Special Requests IN PEDIATRIC BOTTLE Blood Culture adequate volume    Culture NO GROWTH 2 DAYS    Report Status PENDING   Glucose, capillary     Status: Abnormal   Collection Time: 12/09/16  4:08 PM  Result Value Ref Range   Glucose-Capillary 113 (H) 65 - 99 mg/dL  Glucose, capillary     Status: Abnormal   Collection Time: 12/09/16  8:09 PM  Result Value Ref Range   Glucose-Capillary 124 (H) 65 - 99 mg/dL  Glucose,  capillary     Status: Abnormal   Collection Time: 12/09/16 11:48 PM  Result Value Ref Range   Glucose-Capillary 126 (H) 65 - 99 mg/dL  Glucose, capillary     Status: Abnormal   Collection Time: 12/10/16  3:44 AM  Result Value Ref Range   Glucose-Capillary 107 (H) 65 - 99 mg/dL  Blood gas, arterial     Status: Abnormal   Collection Time: 12/10/16  4:59 AM  Result Value Ref Range   FIO2 50.00    Delivery systems VENTILATOR    pH, Arterial 7.335 (L) 7.350 - 7.450   pCO2 arterial 53.8 (H) 32.0 - 48.0 mmHg   pO2, Arterial 177 (H) 83.0 - 108.0 mmHg   Bicarbonate 27.9 20.0 - 28.0 mmol/L   Acid-Base Excess 2.6 (H) 0.0 - 2.0 mmol/L   O2 Saturation 98.9 %   Patient temperature 98.6    Collection site LEFT RADIAL    Drawn by [id number]    Sample type ARTERIAL DRAW    Allens test (pass/fail) PASS PASS  Procalcitonin     Status: None   Collection Time: 12/10/16  5:00 AM  Result Value Ref Range   Procalcitonin 0.58 ng/mL    Comment:        Interpretation: PCT > 0.5 ng/mL and <= 2 ng/mL: Systemic infection (sepsis) is possible, but other conditions are known to elevate PCT as well. (NOTE)         ICU PCT Algorithm               Non ICU PCT Algorithm    ----------------------------     ------------------------------         PCT < 0.25 ng/mL                 PCT < 0.1 ng/mL     Stopping of antibiotics            Stopping of antibiotics       strongly encouraged.               strongly encouraged.    ----------------------------     ------------------------------       PCT level decrease by               PCT < 0.25 ng/mL       >= 80% from peak PCT       OR PCT 0.25 - 0.5 ng/mL          Stopping of antibiotics                                             encouraged.     Stopping of antibiotics  encouraged.    ----------------------------     ------------------------------       PCT level decrease by              PCT >= 0.25 ng/mL       < 80% from peak PCT        AND PCT >= 0.5 ng/mL              Continuing antibiotics                                              encouraged.       Continuing antibiotics            encouraged.    ----------------------------     ------------------------------     PCT level increase compared          PCT > 0.5 ng/mL         with peak PCT AND          PCT >= 0.5 ng/mL             Escalation of antibiotics                                          strongly encouraged.      Escalation of antibiotics        strongly encouraged.   Basic metabolic panel     Status: Abnormal   Collection Time: 12/10/16  5:00 AM  Result Value Ref Range   Sodium 156 (H) 135 - 145 mmol/L   Potassium 4.0 3.5 - 5.1 mmol/L   Chloride 119 (H) 101 - 111 mmol/L   CO2 30 22 - 32 mmol/L   Glucose, Bld 161 (H) 65 - 99 mg/dL   BUN 55 (H) 6 - 20 mg/dL   Creatinine, Ser 1.92 (H) 0.61 - 1.24 mg/dL   Calcium 9.1 8.9 - 68.9 mg/dL   GFR calc non Af Amer >60 >60 mL/min   GFR calc Af Amer >60 >60 mL/min    Comment: (NOTE) The eGFR has been calculated using the CKD EPI equation. This calculation has not been validated in all clinical situations. eGFR's persistently <60 mL/min signify possible Chronic Kidney Disease.    Anion gap 7 5 - 15  Magnesium     Status: Abnormal   Collection Time: 12/10/16  5:00 AM  Result Value Ref Range   Magnesium 2.8 (H) 1.7 - 2.4 mg/dL  Phosphorus     Status: None   Collection Time: 12/10/16  5:00 AM  Result Value Ref Range   Phosphorus 4.3 2.5 - 4.6 mg/dL  CBC     Status: Abnormal   Collection Time: 12/10/16  5:00 AM  Result Value Ref Range   WBC 14.0 (H) 4.0 - 10.5 K/uL   RBC 3.68 (L) 4.22 - 5.81 MIL/uL   Hemoglobin 9.7 (L) 13.0 - 17.0 g/dL   HCT 69.3 (L) 52.9 - 25.9 %   MCV 92.4 78.0 - 100.0 fL   MCH 26.4 26.0 - 34.0 pg   MCHC 28.5 (L) 30.0 - 36.0 g/dL   RDW 18.2 (H) 06.1 - 35.6 %   Platelets 366 150 - 400 K/uL  Glucose, capillary     Status: Abnormal   Collection Time: 12/10/16  7:54 AM  Result Value Ref Range    Glucose-Capillary 121 (H) 65 - 99 mg/dL  Glucose, capillary     Status: Abnormal   Collection Time: 12/10/16 11:49 AM  Result Value Ref Range   Glucose-Capillary 143 (H) 65 - 99 mg/dL   Comment 1 Notify RN    Comment 2 Document in Chart   Glucose, capillary     Status: Abnormal   Collection Time: 12/10/16  3:37 PM  Result Value Ref Range   Glucose-Capillary 162 (H) 65 - 99 mg/dL   Comment 1 Notify RN    Comment 2 Document in Chart   Glucose, capillary     Status: Abnormal   Collection Time: 12/10/16  7:37 PM  Result Value Ref Range   Glucose-Capillary 179 (H) 65 - 99 mg/dL  Glucose, capillary     Status: Abnormal   Collection Time: 12/11/16 12:20 AM  Result Value Ref Range   Glucose-Capillary 200 (H) 65 - 99 mg/dL  Glucose, capillary     Status: Abnormal   Collection Time: 12/11/16  3:34 AM  Result Value Ref Range   Glucose-Capillary 178 (H) 65 - 99 mg/dL  Blood gas, arterial     Status: Abnormal   Collection Time: 12/11/16  4:00 AM  Result Value Ref Range   FIO2 40.00    Delivery systems VENTILATOR    Mode BIVENT    Peep/cpap PHIGH 30  PLOW 0 cm H20   pH, Arterial 7.365 7.350 - 7.450   pCO2 arterial 49.8 (H) 32.0 - 48.0 mmHg   pO2, Arterial 128 (H) 83.0 - 108.0 mmHg   Bicarbonate 27.7 20.0 - 28.0 mmol/L   Acid-Base Excess 2.8 (H) 0.0 - 2.0 mmol/L   O2 Saturation 98.1 %   Patient temperature 98.6    Collection site RIGHT RADIAL    Drawn by [id number]    Sample type ARTERIAL DRAW    Allens test (pass/fail) PASS PASS  Procalcitonin     Status: None   Collection Time: 12/11/16  5:00 AM  Result Value Ref Range   Procalcitonin 0.41 ng/mL    Comment:        Interpretation: PCT (Procalcitonin) <= 0.5 ng/mL: Systemic infection (sepsis) is not likely. Local bacterial infection is possible. (NOTE)         ICU PCT Algorithm               Non ICU PCT Algorithm    ----------------------------     ------------------------------         PCT < 0.25 ng/mL                 PCT < 0.1  ng/mL     Stopping of antibiotics            Stopping of antibiotics       strongly encouraged.               strongly encouraged.    ----------------------------     ------------------------------       PCT level decrease by               PCT < 0.25 ng/mL       >= 80% from peak PCT       OR PCT 0.25 - 0.5 ng/mL          Stopping of antibiotics  encouraged.     Stopping of antibiotics           encouraged.    ----------------------------     ------------------------------       PCT level decrease by              PCT >= 0.25 ng/mL       < 80% from peak PCT        AND PCT >= 0.5 ng/mL            Continuin g antibiotics                                              encouraged.       Continuing antibiotics            encouraged.    ----------------------------     ------------------------------     PCT level increase compared          PCT > 0.5 ng/mL         with peak PCT AND          PCT >= 0.5 ng/mL             Escalation of antibiotics                                          strongly encouraged.      Escalation of antibiotics        strongly encouraged.   Basic metabolic panel     Status: Abnormal   Collection Time: 12/11/16  5:00 AM  Result Value Ref Range   Sodium 154 (H) 135 - 145 mmol/L   Potassium 3.7 3.5 - 5.1 mmol/L   Chloride 116 (H) 101 - 111 mmol/L   CO2 27 22 - 32 mmol/L   Glucose, Bld 259 (H) 65 - 99 mg/dL   BUN 37 (H) 6 - 20 mg/dL   Creatinine, Ser 1.26 (H) 0.61 - 1.24 mg/dL   Calcium 9.0 8.9 - 10.3 mg/dL   GFR calc non Af Amer >60 >60 mL/min   GFR calc Af Amer >60 >60 mL/min    Comment: (NOTE) The eGFR has been calculated using the CKD EPI equation. This calculation has not been validated in all clinical situations. eGFR's persistently <60 mL/min signify possible Chronic Kidney Disease.    Anion gap 11 5 - 15  CBC     Status: Abnormal   Collection Time: 12/11/16  5:00 AM  Result Value Ref Range   WBC 17.7 (H) 4.0  - 10.5 K/uL   RBC 3.86 (L) 4.22 - 5.81 MIL/uL   Hemoglobin 10.2 (L) 13.0 - 17.0 g/dL   HCT 35.4 (L) 39.0 - 52.0 %   MCV 91.7 78.0 - 100.0 fL   MCH 26.4 26.0 - 34.0 pg   MCHC 28.8 (L) 30.0 - 36.0 g/dL   RDW 15.9 (H) 11.5 - 15.5 %   Platelets 412 (H) 150 - 400 K/uL  Magnesium     Status: Abnormal   Collection Time: 12/11/16  5:00 AM  Result Value Ref Range   Magnesium 2.6 (H) 1.7 - 2.4 mg/dL  Phosphorus     Status: None   Collection Time: 12/11/16  5:00 AM  Result Value Ref Range   Phosphorus  3.6 2.5 - 4.6 mg/dL  Glucose, capillary     Status: Abnormal   Collection Time: 12/11/16  8:12 AM  Result Value Ref Range   Glucose-Capillary 156 (H) 65 - 99 mg/dL  Glucose, capillary     Status: Abnormal   Collection Time: 12/11/16 11:54 AM  Result Value Ref Range   Glucose-Capillary 157 (H) 65 - 99 mg/dL    Dg Chest Port 1 View  Result Date: 12/11/2016 CLINICAL DATA:  Intubated patient, CVA, obesity. Status post intracranial injury aneurysm clipping 9 days ago. EXAM: PORTABLE CHEST 1 VIEW COMPARISON:  Portable chest x-ray of Dec 10, 2016 FINDINGS: The lungs are reasonably well inflated. The interstitial markings are increased. The pulmonary vascularity is engorged. The cardiac silhouette is enlarged. The endotracheal tube tip lies 5.6 cm above the carina. The esophagogastric tube and the feeding tube tips project below the inferior margin of the image as best as can be determined. The right PICC line tip projects over the distal third of the SVC. IMPRESSION: Persistent pulmonary vascular congestion and mild interstitial edema. No alveolar infiltrate or pleural effusion. Stable cardiomegaly. The tubes and lines are in position as described. Electronically Signed   By: David  Martinique M.D.   On: 12/11/2016 07:20   Dg Chest Port 1 View  Result Date: 12/10/2016 CLINICAL DATA:  Check endotracheal tube placement EXAM: PORTABLE CHEST 1 VIEW COMPARISON:  12/09/2016 FINDINGS: Cardiac shadow is mildly  prominent but accentuated by the portable technique. Endotracheal tube and feeding catheter are again noted and stable. A right-sided PICC line is noted in satisfactory position. Mild vascular congestion is noted without focal confluent infiltrate. The previously seen left-sided infiltrate has significantly improved in the interval. No bony abnormality is noted. IMPRESSION: Persistent vascular congestion. Tubes and lines as described. Improved infiltrative change on the left. Electronically Signed   By: Inez Catalina M.D.   On: 12/10/2016 07:26   Dg Chest Port 1 View  Result Date: 12/09/2016 CLINICAL DATA:  Hypoxia EXAM: PORTABLE CHEST 1 VIEW COMPARISON:  Study obtained earlier in the day FINDINGS: Central catheter tip is in the superior vena cava with the tip slightly above the level of the carina. Endotracheal tube tip is 5.6 cm above the carina. Feeding tube tip is below the diaphragm. No pneumothorax. There is extensive airspace consolidation on the left, stable. Right lung is clear. There is mild cardiomegaly with pulmonary venous hypertension. No adenopathy. No bone lesions. IMPRESSION: Central catheter tip now in superior vena cava. Endotracheal tube as described. Feeding tube tip below the diaphragm. No pneumothorax. Suspect multifocal pneumonia on the left with widespread airspace consolidation on the left. Right lung clear. Stable cardiac prominence. There may be a degree of underlying pulmonary venous hypertension suggesting underlying pulmonary vascular congestion. Electronically Signed   By: Lowella Grip III M.D.   On: 12/09/2016 13:00   Dg Abd Portable 1v  Result Date: 12/10/2016 CLINICAL DATA:  Encounter for nasogastric tube placement EXAM: PORTABLE ABDOMEN - 1 VIEW COMPARISON:  None. FINDINGS: The visualized bowel gas pattern is normal. A feeding tube is seen coiled within the body of the stomach, with the tip of the feeding tube once again projecting toward the cardia of the stomach.  There also appears to be a nasogastric tube with tip and side-port in the region of the gastric fundus. No radio-opaque calculi or other significant radiographic abnormality are seen. IMPRESSION: Tips of a feeding tube and gastric tube are seen in the region of the gastric fundus  Electronically Signed   By: Ashley Royalty M.D.   On: 12/10/2016 17:55   Dg Abd Portable 1v  Result Date: 12/10/2016 CLINICAL DATA:  Assess feeding tube positioning. EXAM: PORTABLE ABDOMEN - 1 VIEW COMPARISON:  Abdominal radiograph of Dec 02, 2016 FINDINGS: The radiodense tipped feeding tube is coiled with coiled within the stomach with the tip oriented toward the cardia. There are loops of gas-filled bowel in the mid abdomen. IMPRESSION: The tip of the feeding tube is oriented toward the gastric cardia with a portion of the tube extending as far to the right as the pyloric region. If the patient can tolerate right-side-down decubitus positioning, this may help propel the tube tip through the pylorus into the duodenum. Electronically Signed   By: David  Martinique M.D.   On: 12/10/2016 14:44    [id number] except as listed in admit H&P  Blood pressure (!) 172/100, pulse 77, temperature 99.3 F (37.4 C), resp. rate (!) 23, height '5\' 11"'$  (1.803 m), weight (!) 208.7 kg (460 lb), SpO2 100 %.  PHYSICAL EXAM: Overall appearance:  Morbidly obese gentleman, orally intubated. Ears: External ars look healthy. Nose: External nose is healthy in appearance. Internal nasal exam free of any lesions or obstruction. Oral Cavity/Pharynx:  ETT in place. Larynx/Hypopharynx: Deferred Neck: No palpable neck masses.  Studies Reviewed: none  Procedures: none   Assessment/Plan: Chronic ventilator dependent. I agree with recommendation for tracheostomy. If we can coordinate with interventional radiology then we can do it at the same time. I will discuss this with IR today. This was all discussed with the mother today. All questions were  answered.  Bryan [name] 12/11/2016, 12:54 PM

## 2016-12-11 NOTE — Progress Notes (Signed)
Cortrak Tube Team Note:  Consult received to replace a Cortrak feeding tube 5/16.   A 10 F Cortrak tube was placed in the left nare and secured with a nasal bridle at 91 cm. Per the Cortrak monitor reading the tube tip is stomach.   X-ray was required and confirmed placement in the stomach.   If the tube becomes dislodged please keep the tube and contact the Cortrak team at [email or web address] (password [email or web address]) for replacement.  If after hours and replacement cannot be delayed, place a NG tube and confirm placement with an abdominal x-ray.    Betsey Holidayasey Clarion Mooneyhan MS, RD, LDN Pager #- [phone]

## 2016-12-11 NOTE — Sedation Documentation (Signed)
Tolerated well, moving bad to bed.  ICU RN present for tx back to floor.

## 2016-12-11 NOTE — Progress Notes (Signed)
Referring Physician(s):  Dr. Sung AmabileSimonds  Supervising Physician: Irish LackYamagata, Glenn  Patient Status:  [facility] - In-pt  Chief Complaint:  DVT  Subjective: Remains intubated, unable to tolerate SBT.  May be candidate for early trach.  Allergies: Penicillins  Medications: Prior to Admission medications   Medication Sig Start Date End Date Taking? Authorizing Provider  oxyCODONE-acetaminophen (PERCOCET/ROXICET) 5-325 MG per tablet Take 1-2 tablets by mouth every 4 (four) hours as needed. Patient not taking: Reported on 01/12/2015 09/02/12   Jetty PeeksPetrarca, Brian D, PA-C     Vital Signs: BP (!) 177/159   Pulse 88   Temp 99.9 F (37.7 C)   Resp (!) 21   Ht 5\' 11"  (1.803 m)   Wt (!) 460 lb (208.7 kg)   SpO2 100%   BMI 64.16 kg/m   Physical Exam  Constitutional: He appears well-developed.  Cardiovascular: Normal rate.   Pulmonary/Chest: No respiratory distress.  intubated  Neurological:  sedated  Skin: Skin is warm and dry.  Nursing note and vitals reviewed.   Imaging: Dg Chest Port 1 View  Result Date: 12/11/2016 CLINICAL DATA:  Intubated patient, CVA, obesity. Status post intracranial injury aneurysm clipping 9 days ago. EXAM: PORTABLE CHEST 1 VIEW COMPARISON:  Portable chest x-ray of Dec 10, 2016 FINDINGS: The lungs are reasonably well inflated. The interstitial markings are increased. The pulmonary vascularity is engorged. The cardiac silhouette is enlarged. The endotracheal tube tip lies 5.6 cm above the carina. The esophagogastric tube and the feeding tube tips project below the inferior margin of the image as best as can be determined. The right PICC line tip projects over the distal third of the SVC. IMPRESSION: Persistent pulmonary vascular congestion and mild interstitial edema. No alveolar infiltrate or pleural effusion. Stable cardiomegaly. The tubes and lines are in position as described. Electronically Signed   By: David  SwazilandJordan M.D.   On: 12/11/2016 07:20   Dg Chest Port 1  View  Result Date: 12/10/2016 CLINICAL DATA:  Check endotracheal tube placement EXAM: PORTABLE CHEST 1 VIEW COMPARISON:  12/09/2016 FINDINGS: Cardiac shadow is mildly prominent but accentuated by the portable technique. Endotracheal tube and feeding catheter are again noted and stable. A right-sided PICC line is noted in satisfactory position. Mild vascular congestion is noted without focal confluent infiltrate. The previously seen left-sided infiltrate has significantly improved in the interval. No bony abnormality is noted. IMPRESSION: Persistent vascular congestion. Tubes and lines as described. Improved infiltrative change on the left. Electronically Signed   By: Alcide CleverMark  Lukens M.D.   On: 12/10/2016 07:26   Dg Chest Port 1 View  Result Date: 12/09/2016 CLINICAL DATA:  Hypoxia EXAM: PORTABLE CHEST 1 VIEW COMPARISON:  Study obtained earlier in the day FINDINGS: Central catheter tip is in the superior vena cava with the tip slightly above the level of the carina. Endotracheal tube tip is 5.6 cm above the carina. Feeding tube tip is below the diaphragm. No pneumothorax. There is extensive airspace consolidation on the left, stable. Right lung is clear. There is mild cardiomegaly with pulmonary venous hypertension. No adenopathy. No bone lesions. IMPRESSION: Central catheter tip now in superior vena cava. Endotracheal tube as described. Feeding tube tip below the diaphragm. No pneumothorax. Suspect multifocal pneumonia on the left with widespread airspace consolidation on the left. Right lung clear. Stable cardiac prominence. There may be a degree of underlying pulmonary venous hypertension suggesting underlying pulmonary vascular congestion. Electronically Signed   By: Bretta BangWilliam  Woodruff III M.D.   On: 12/09/2016 13:00  Dg Chest Port 1 View  Result Date: 12/09/2016 CLINICAL DATA:  Acute respiratory failure with hypoxia EXAM: PORTABLE CHEST 1 VIEW COMPARISON:  Portable chest x-ray of Dec 07, 2016. FINDINGS:  There has been further deterioration in the appearance of the left hemithorax with only a very small amount of aerated lung parenchyma present. The left heart border is obscured. On the right the lung is adequately inflated. The pulmonary vascularity is engorged. The endotracheal tube tip lies 4.7 cm above the carina. The feeding tube tip projects below the inferior margin of the image. The PICC line tip crosses the midline from right to left and appears to terminate in the distal aspect of the left subclavian vein or junction of the left subclavian vein with the left internal jugular vein. IMPRESSION: Interval worsening in the appearance of the left lung compatible with atelectasis or pneumonia or less likely asymmetric pulmonary edema. There is pulmonary vascular congestion on the right more conspicuous today without significant edema. Repositioning of the PICC line is recommended to assure that it lies within the superior vena cava. Electronically Signed   By: David  [place]aziland M.D.   On: 12/09/2016 07:33   Dg Abd Portable 1v  Result Date: 12/10/2016 CLINICAL DATA:  Encounter for nasogastric tube placement EXAM: PORTABLE ABDOMEN - 1 VIEW COMPARISON:  None. FINDINGS: The visualized bowel gas pattern is normal. A feeding tube is seen coiled within the body of the stomach, with the tip of the feeding tube once again projecting toward the cardia of the stomach. There also appears to be a nasogastric tube with tip and side-port in the region of the gastric fundus. No radio-opaque calculi or other significant radiographic abnormality are seen. IMPRESSION: Tips of a feeding tube and gastric tube are seen in the region of the gastric fundus Electronically Signed   By: Tollie Eth M.D.   On: 12/10/2016 17:55   Dg Abd Portable 1v  Result Date: 12/10/2016 CLINICAL DATA:  Assess feeding tube positioning. EXAM: PORTABLE ABDOMEN - 1 VIEW COMPARISON:  Abdominal radiograph of Dec 02, 2016 FINDINGS: The radiodense tipped  feeding tube is coiled with coiled within the stomach with the tip oriented toward the cardia. There are loops of gas-filled bowel in the mid abdomen. IMPRESSION: The tip of the feeding tube is oriented toward the gastric cardia with a portion of the tube extending as far to the right as the pyloric region. If the patient can tolerate right-side-down decubitus positioning, this may help propel the tube tip through the pylorus into the duodenum. Electronically Signed   By: David  [place] M.D.   On: 12/10/2016 14:44    Labs:  CBC:  Recent Labs  12/08/16 0430 12/09/16 0501 12/10/16 0500 12/11/16 0500  WBC 15.2* 15.5* 14.0* 17.7*  HGB 10.7* 9.5* 9.7* 10.2*  HCT 34.7* 32.2* 34.0* 35.4*  PLT 332 333 366 412*    COAGS:  Recent Labs  12/01/16 2343  INR 1.02  APTT 29    BMP:  Recent Labs  12/08/16 0430 12/09/16 0501 12/10/16 0500 12/11/16 0500  NA 149* 152* 156* 154*  K 3.6 3.4* 4.0 3.7  CL 110 115* 119* 116*  CO2 29 30 30 27   GLUCOSE 136* 129* 161* 259*  BUN 51* 40* 55* 37*  CALCIUM 8.8* 8.3* 9.1 9.0  CREATININE 1.28* 0.95 1.42* 1.26*  GFRNONAA >60 >60 >60 >60  GFRAA >60 >60 >60 >60    LIVER FUNCTION TESTS: No results for input(s): BILITOT, AST, ALT, ALKPHOS, PROT, ALBUMIN  in the last 8760 hours.  Assessment and Plan: DVT Dopplers yesterday read as confirmed DVT in popliteal and tibial in left leg.  IR following for patient in need of IVC filter in the setting of acute DVTs without ability to be orally anticoagulated.  Patient weight has been variable since admission and re-weighing has been difficult to verify accuracy.  Ongoing discussions with floor staff as well as IR staff and MD to make appropriate plans for patient. Per RN today, patient bed has been replaced and new weight today is 468 lbs.  He has been getting lasix- which is discontinued today. Also per RN patient may be going to OR for a possible trach placement.  Question whether trach and IVC filter  placement can be arranged same day.  Will discuss with Dr. Fredia Sorrow.  IR to follow.  Electronically Signed: Hoyt Koch 12/11/2016, 11:01 AM   I spent a total of 15 Minutes at the the patient's bedside AND on the patient's hospital floor or unit, greater than 50% of which was counseling/coordinating care for DVT.

## 2016-12-11 NOTE — Progress Notes (Signed)
PULMONARY / CRITICAL CARE MEDICINE   Name: Bryan [name] MRN: [id number] DOB: 10-15-1980    ADMISSION DATE:  12/01/2016 CONSULTATION DATE:  12/02/2016  REFERRING MD:  Dr. Patric Dykes   CHIEF COMPLAINT:  SAH  BRIEF SUMMARY:  36 y.o.malewith PMH of morbid obesity, varicose veins who presented to [facility] ED 12/01/16 with sudden onset of headache that started earlier that morning. CTA demonstrated diffuse SAH over both convexities and extending into the basal cisterns. Admitted by neurosurgery with plans for angiogram 5/8 with possible clipping.  Subsequently developed worsening hypoxemia and increased WOB requiring intubation.    SUBJECTIVE:  Moving right side better, remains on pressors, no events overnight  VITAL SIGNS: BP (!) 173/154   Pulse 100   Temp 99.9 F (37.7 C)   Resp (!) 25   Ht 5\' 11"  (1.803 m)   Wt (!) 208.7 kg (460 lb)   SpO2 100%   BMI 64.16 kg/m   HEMODYNAMICS:    VENTILATOR SETTINGS: Vent Mode: PCV FiO2 (%):  [40 %] 40 % Set Rate:  [15 bmp] 15 bmp PEEP:  [0 cmH20-5 cmH20] 5 cmH20 Pressure Support:  [15 cmH20] 15 cmH20 Plateau Pressure:  [28 cmH20-29 cmH20] 29 cmH20  INTAKE / OUTPUT: I/O last 3 completed shifts: In: 3798.4 [I.V.:2098.4; NG/GT:1450; IV Piggyback:250] Out: 5800 [Urine:5500; Stool:300]  PHYSICAL EXAMINATION: General: Morbidly obese male in NAD on vent HEENT: MM pink/moist, ETT, tongue swollen/protruding, left forehead dressing c/d/i  Neuro: Opens eyes and moving all ext but weaker on the right CV: s1s2 rrr, no m/r/g PULM: even/non-labored, lungs bilaterally coarse  WU:JWJX, non-tender, bsx4 active  Extremities: warm/dry, trace generalized edema  Skin: no rashes or lesions   LABS:  BMET  Recent Labs Lab 12/09/16 0501 12/10/16 0500 12/11/16 0500  NA 152* 156* 154*  K 3.4* 4.0 3.7  CL 115* 119* 116*  CO2 30 30 27   BUN 40* 55* 37*  CREATININE 0.95 1.42* 1.26*  GLUCOSE 129* 161* 259*   Electrolytes  Recent Labs Lab 12/07/16 0538   12/09/16 0501 12/10/16 0500 12/11/16 0500  CALCIUM 8.8*  < > 8.3* 9.1 9.0  MG 2.5*  < > 2.4 2.8* 2.6*  PHOS 3.7  --   --  4.3 3.6  < > = values in this interval not displayed.  CBC  Recent Labs Lab 12/09/16 0501 12/10/16 0500 12/11/16 0500  WBC 15.5* 14.0* 17.7*  HGB 9.5* 9.7* 10.2*  HCT 32.2* 34.0* 35.4*  PLT 333 366 412*   Coag's No results for input(s): APTT, INR in the last 168 hours. Sepsis Markers  Recent Labs Lab 12/09/16 0918 12/10/16 0500 12/11/16 0500  PROCALCITON 0.45 0.58 0.41   ABG  Recent Labs Lab 12/09/16 1118 12/10/16 0459 12/11/16 0400  PHART 7.371 7.335* 7.365  PCO2ART 58.2* 53.8* 49.8*  PO2ART 116.0* 177* 128*   Liver Enzymes No results for input(s): AST, ALT, ALKPHOS, BILITOT, ALBUMIN in the last 168 hours.  Cardiac Enzymes No results for input(s): TROPONINI, PROBNP in the last 168 hours.  Glucose  Recent Labs Lab 12/10/16 1149 12/10/16 1537 12/10/16 1937 12/11/16 0020 12/11/16 0334 12/11/16 0812  GLUCAP 143* 162* 179* 200* 178* 156*   Imaging CXR 5/15 >> interval worsening of left lung opacity, edema vs opacity/LLL  STUDIES: CTA head 5/7 >> diffuse SAH over both convexities and extending into the basal cisterns. CXR 5/10 >> Cardiomegaly with increasing bilateral airspace opacities which may reflect edema/atelectasis  ECHO 5/10 >> EF of 65-70% and LVH, poor image to  determine diastolic dysfunction Transcranial dopplers 5/11 >> poor study LE Doppler 5/15 >> prelim results with concern for probable acute DVT in left popliteal vein, left posterior tibial veins    CULTURES: MRSA 5/8 > Negative  Blood 5/11 >> 1 of 2 GPC >> Coag-negative staph Tracheal aspirate 5/15 >>  [facility] >>   ANTIBIOTICS: Vancomycin 1 given 5/12 Cefepime 5/15 (? New L infiltrate) >>   SIGNIFICANT EVENTS: 5/07  Admit with sudden headache, SAH. 5/08  PCCM consult > intubation >clipping of posterior communicating artery aneurysm  5/12  Vanc x1  for 1/2 coag neg staph, lasix gtt decreased, desats with WUA 5/13  Lasix gtt discontinued 5/14  Fever overnight to 102.8, pink frothy sputum late evening, difficulty oxygenating  5/15  LE duplex, concern for possible popliteal & tibial DVT   LINES/TUBES: ETT 5/8 >> Right upper extremity PICC 5/9 >>   DISCUSSION: 36 y.o.maleadmitted 5/7 with SAH. Required intubation 5/8 due to hypoxemia and increased WOB.  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, s/p clipping  P:   RASS goal: 0 to -1 Post-op care per NSGY  Continue Nimodipine, plan for 21 days, D11/21 Daily SBT / WUA  Fentanyl gtt / versed for pain  PT efforts as able  TCD's per NSGY   PULMONARY A: Acute Hypoxic Respiratory Failure + Pulmonary Edema New L Airspace Disease - new 5/15, edema vs infiltrate OSA/OHS P:   Changed from APRV to conventional ventilation ABG in AM Wean O2 for sats >92% VAP prevention measures  Intermittent CXR ENT consult called for a tracheostomy D/C lasix  CARDIOVASCULAR A:  HTN P:  ICU monitoring of hemodynamics SBP goal 200 Hold antiHTN PRN hydralazine, labetalol  Monitor I/O"s Nimodipine as above Levophed for BP target above  RENAL A:   Acute Kidney Injury (Base Crt 0.89) - note received single dose vancomycin 5/12, improved Hypernatremia - mild Hypokalemia  Volume Overload P:   Trend BMP / urinary output Replace electrolytes as indicated Avoid nephrotoxic agents, ensure adequate renal perfusion D/C lasix Free water 200 ml PT Q8  GASTROINTESTINAL A:   Morbid Obesity  Nutrition  GI Prophylaxis  P:   PPI for SUP  TF per nutrition  HEMATOLOGIC A:   VTE Prophylaxis  Possible LLE DVT - see preliminary report  P:  F/U CBC SCD's for DVT prophylaxis  F/U with IR for IVC filter placement, to be evaluated today. No anticoagulants given ICH   INFECTIOUS A:   Leukocytosis  Fever - 5/10,  5/15 Coag negative staph in one of 2 blood cultures, presumed skin contaminant P:   Continue cefepime, D3/x  Follow fever curve / WBC trend  Monitor off abx CXR improved 5/16 > doubt PNA, feel more likely de-recruitment / atelectasis + edema  ENDOCRINE A:   Hyperglycemia   P:   Follow glucose on BMP    FAMILY  - Updates:  No family bedside.  - Inter-disciplinary family meet or Palliative Care meeting due by: 5/15   The patient is critically ill with multiple organ systems failure and requires high complexity decision making for assessment and support, frequent evaluation and titration of therapies, application of advanced monitoring technologies and extensive interpretation of multiple databases.   Critical Care Time devoted to patient care services described in this note is  35  Minutes. This time reflects time of care of this signee Dr Koren BoundWesam Yacoub. This critical care time does not  reflect procedure time, or teaching time or supervisory time of PA/NP/Med student/Med Resident etc but could involve care discussion time.  Rush Farmer, M.D. [facility] Pulmonary/Critical Care Medicine. Pager: [phone]. After hours pager: [phone].

## 2016-12-11 NOTE — Progress Notes (Signed)
Unsuccessful SBT attempted by RT. RR in upper 40's. Will attempt again later. RT will continue to monitor.

## 2016-12-11 NOTE — Procedures (Signed)
Interventional Radiology Procedure Note  Procedure: IVC filter placement  Complications: None  Estimated Blood Loss: < 10 mL  Findings: IVC normally patent. Bard Denali retrievable IVC filter placed in infrarenal IVC.  Taylynn Easton T. Seanne Chirico, M.D Pager:  [phone]   

## 2016-12-12 ENCOUNTER — Inpatient Hospital Stay (HOSPITAL_COMMUNITY): Payer: BLUE CROSS/BLUE SHIELD

## 2016-12-12 ENCOUNTER — Encounter (HOSPITAL_COMMUNITY): Payer: Self-pay | Admitting: Interventional Radiology

## 2016-12-12 DIAGNOSIS — I609 Nontraumatic subarachnoid hemorrhage, unspecified: Secondary | ICD-10-CM

## 2016-12-12 DIAGNOSIS — I824Y9 Acute embolism and thrombosis of unspecified deep veins of unspecified proximal lower extremity: Secondary | ICD-10-CM

## 2016-12-12 DIAGNOSIS — E870 Hyperosmolality and hypernatremia: Secondary | ICD-10-CM

## 2016-12-12 LAB — BASIC METABOLIC PANEL
Anion gap: 9 (ref 5–15)
BUN: 24 mg/dL — ABNORMAL HIGH (ref 6–20)
CALCIUM: 9.2 mg/dL (ref 8.9–10.3)
CO2: 26 mmol/L (ref 22–32)
CREATININE: 1.13 mg/dL (ref 0.61–1.24)
Chloride: 122 mmol/L — ABNORMAL HIGH (ref 101–111)
GFR calc Af Amer: >60 mL/min (ref >60)
Glucose, Bld: 194 mg/dL — ABNORMAL HIGH (ref 65–99)
POTASSIUM: 3.3 mmol/L — AB (ref 3.5–5.1)
SODIUM: 157 mmol/L — AB (ref 135–145)

## 2016-12-12 LAB — BLOOD GAS, ARTERIAL
Acid-Base Excess: 2.2 mmol/L — ABNORMAL HIGH (ref 0.0–2.0)
Bicarbonate: 26.3 mmol/L (ref 20.0–28.0)
DRAWN BY: 41977
FIO2: 40
O2 Saturation: 94.9 %
PATIENT TEMPERATURE: 98.6
PCO2 ART: 40.9 mmHg (ref 32.0–48.0)
PEEP/CPAP: 5 cmH2O
PO2 ART: 81.2 mmHg — AB (ref 83.0–108.0)
Pressure control: 30 cmH2O
RATE: 15 resp/min
pH, Arterial: 7.423 (ref 7.350–7.450)

## 2016-12-12 LAB — GLUCOSE, CAPILLARY
GLUCOSE-CAPILLARY: 155 mg/dL — AB (ref 65–99)
GLUCOSE-CAPILLARY: 155 mg/dL — AB (ref 65–99)
GLUCOSE-CAPILLARY: 166 mg/dL — AB (ref 65–99)
Glucose-Capillary: 145 mg/dL — ABNORMAL HIGH (ref 65–99)
Glucose-Capillary: 154 mg/dL — ABNORMAL HIGH (ref 65–99)
Glucose-Capillary: 159 mg/dL — ABNORMAL HIGH (ref 65–99)
Glucose-Capillary: 161 mg/dL — ABNORMAL HIGH (ref 65–99)

## 2016-12-12 LAB — CULTURE, RESPIRATORY

## 2016-12-12 LAB — CBC
HCT: 34.9 % — ABNORMAL LOW (ref 39.0–52.0)
Hemoglobin: 10.4 g/dL — ABNORMAL LOW (ref 13.0–17.0)
MCH: 26.7 pg (ref 26.0–34.0)
MCHC: 29.8 g/dL — ABNORMAL LOW (ref 30.0–36.0)
MCV: 89.7 fL (ref 78.0–100.0)
PLATELETS: 411 10*3/uL — AB (ref 150–400)
RBC: 3.89 MIL/uL — AB (ref 4.22–5.81)
RDW: 15.5 % (ref 11.5–15.5)
WBC: 15.5 10*3/uL — ABNORMAL HIGH (ref 4.0–10.5)

## 2016-12-12 LAB — PHOSPHORUS: Phosphorus: 3.3 mg/dL (ref 2.5–4.6)

## 2016-12-12 LAB — CULTURE, RESPIRATORY W GRAM STAIN

## 2016-12-12 LAB — MAGNESIUM: Magnesium: 2.4 mg/dL (ref 1.7–2.4)

## 2016-12-12 MED ORDER — POTASSIUM CHLORIDE 20 MEQ/15ML (10%) PO SOLN
40.0000 meq | Freq: Three times a day (TID) | ORAL | Status: AC
  Administered 2016-12-12 (×2): 40 meq
  Filled 2016-12-12 (×2): qty 30

## 2016-12-12 MED ORDER — FREE WATER
250.0000 mL | Freq: Four times a day (QID) | Status: DC
  Administered 2016-12-12 – 2016-12-22 (×38): 250 mL

## 2016-12-12 MED ORDER — SODIUM CHLORIDE 0.9 % IV SOLN
0.0000 ug/min | INTRAVENOUS | Status: DC
  Administered 2016-12-12: 170 ug/min via INTRAVENOUS
  Administered 2016-12-12: 290 ug/min via INTRAVENOUS
  Administered 2016-12-13: 310 ug/min via INTRAVENOUS
  Administered 2016-12-13: 290 ug/min via INTRAVENOUS
  Administered 2016-12-13: 300 ug/min via INTRAVENOUS
  Administered 2016-12-13: 280 ug/min via INTRAVENOUS
  Filled 2016-12-12 (×7): qty 4

## 2016-12-12 NOTE — Progress Notes (Signed)
PULMONARY / CRITICAL CARE MEDICINE   Name: Kem KaysDarren D Mathwig MRN: [id number] DOB: [birth year]    ADMISSION DATE:  12/01/2016 CONSULTATION DATE:  12/02/2016  REFERRING MD:  Dr. Patric DykesNunkumar   CHIEF COMPLAINT:  SAH  BRIEF SUMMARY:  36 y.o.malewith PMH of morbid obesity, varicose veins who [facility] with sudden onset of headache that started earlier that morning. CTA demonstrated diffuse SAH over both convexities and extending into the basal cisterns. Admitted by neurosurgery with plans for angiogram 5/8 with possible clipping.  Subsequently developed worsening hypoxemia and increased WOB requiring intubation.    SUBJECTIVE:  Continues to auto-diurese, no events overnight, remains on pressors  VITAL SIGNS: BP (!) 202/66 (BP Location: Right Arm)   Pulse 74   Temp 99.4 F (37.4 C) (Core (Comment)) Comment (Src): cooling blanket  Resp 19   Ht 5\' 11"  (1.803 m)   Wt (!) 210.5 kg (464 lb)   SpO2 100%   BMI 64.71 kg/m   HEMODYNAMICS:    VENTILATOR SETTINGS: Vent Mode: PCV FiO2 (%):  [40 %-50 %] 40 % Set Rate:  [15 bmp-26 bmp] 15 bmp PEEP:  [5 cmH20] 5 cmH20 Plateau Pressure:  [32 cmH20-35 cmH20] 32 cmH20  INTAKE / OUTPUT: I/O last 3 completed shifts: In: 4634.1 [I.V.:3159.1; NG/GT:1275; IV Piggyback:200] Out: 7650 [Urine:7400; Stool:250]  PHYSICAL EXAMINATION: General: Morbidly obese male in NAD on PCV HEENT: Head dressing noted, PERRL, EOM-spontaneous and MMM Neuro: Opens eyes to command but otherwise, not following commands, moving all ext to  CV: RRR, Nl S1/S2, -M/R/G. PULM: even/non-labored, lungs bilaterally coarse  WG:NFAOGI:soft, non-tender, bsx4 active  Extremities: warm/dry, trace generalized edema  Skin: no rashes or lesions  LABS:  BMET  Recent Labs Lab 12/10/16 0500 12/11/16 0500 12/12/16 0559  NA 156* 154* 157*  K 4.0 3.7 3.3*  CL 119* 116* 122*  CO2 30 27 26   BUN 55* 37* 24*  CREATININE 1.42* 1.26* 1.13  GLUCOSE 161* 259* 194*    Electrolytes  Recent Labs Lab 12/10/16 0500 12/11/16 0500 12/12/16 0559  CALCIUM 9.1 9.0 9.2  MG 2.8* 2.6* 2.4  PHOS 4.3 3.6 3.3   CBC  Recent Labs Lab 12/10/16 0500 12/11/16 0500 12/12/16 0559  WBC 14.0* 17.7* 15.5*  HGB 9.7* 10.2* 10.4*  HCT 34.0* 35.4* 34.9*  PLT 366 412* 411*   Coag's No results for input(s): APTT, INR in the last 168 hours. Sepsis Markers  Recent Labs Lab 12/09/16 0918 12/10/16 0500 12/11/16 0500  PROCALCITON 0.45 0.58 0.41   ABG  Recent Labs Lab 12/10/16 0459 12/11/16 0400 12/12/16 0410  PHART 7.335* 7.365 7.423  PCO2ART 53.8* 49.8* 40.9  PO2ART 177* 128* 81.2*   Liver Enzymes No results for input(s): AST, ALT, ALKPHOS, BILITOT, ALBUMIN in the last 168 hours.  Cardiac Enzymes No results for input(s): TROPONINI, PROBNP in the last 168 hours.  Glucose  Recent Labs Lab 12/11/16 0812 12/11/16 1154 12/11/16 1932 12/12/16 0011 12/12/16 0357 12/12/16 0741  GLUCAP 156* 157* 151* 145* 155* 155*   Imaging CXR 5/15 >> interval worsening of left lung opacity, edema vs opacity/LLL  STUDIES: CTA head 5/7 >> diffuse SAH over both convexities and extending into the basal cisterns. CXR 5/10 >> Cardiomegaly with increasing bilateral airspace opacities which may reflect edema/atelectasis  ECHO 5/10 >> EF of 65-70% and LVH, poor image to determine diastolic dysfunction Transcranial dopplers 5/11 >> poor study LE Doppler 5/15 >> prelim results with concern for probable acute DVT in left popliteal vein, left  posterior tibial veins   CULTURES: MRSA 5/8 > Negative  Blood 5/11 >> 1 of 2 GPC >> Coag-negative staph Tracheal aspirate 5/15 >> Moraxella  BC 5/15 >> NTD  ANTIBIOTICS: Vancomycin 1 given 5/12 Cefepime 5/15 (? New L infiltrate) >> 5/17  SIGNIFICANT EVENTS: 5/07  Admit with sudden headache, SAH. 5/08  PCCM consult > intubation >clipping of posterior communicating artery aneurysm  5/12  Vanc x1 for 1/2 coag neg staph,  lasix gtt decreased, desats with WUA 5/13  Lasix gtt discontinued 5/14  Fever overnight to 102.8, pink frothy sputum late evening, difficulty oxygenating  5/15  LE duplex, concern for possible popliteal & tibial DVT   LINES/TUBES: ETT 5/8 >> Right upper extremity PICC 5/9 >>   DISCUSSION: 36 y.o.maleadmitted 5/7 with SAH. Required intubation 5/8 due to hypoxemia and increased WOB.  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, s/p clipping  P:   RASS goal: 0 to -1 Post-op care per NSGY  Continue Nimodipine, plan for 21 days, D12/21 Daily SBT / WUA  Fentanyl gtt / versed for pain and anxiety PT efforts as able  TCD's per NSGY  Na = 157, on low dose free water, will increase D/C lopressor  PULMONARY A: Acute Hypoxic Respiratory Failure + Pulmonary Edema New L Airspace Disease - new 5/15, edema vs infiltrate OSA/OHS P:   On PCV, decrease P high to 20 with f/u ABG in AM Wean O2 for sats >92% VAP prevention measures  Daily CXR ENT consult called for a tracheostomy 5/17 Hold further diureses  CARDIOVASCULAR A:  HTN DVT P:  ICU monitoring of hemodynamics SBP goal 200 Hold antiHTN PRN hydralazine, labetalol  Monitor I/O"s Nimodipine as above Levophed for BP target above Add neo to achieve above SBP target D/C lopressor (was on 50 BID PO) IVC filter placed by IR 5/17 (appreciate assistance).  RENAL A:   Acute Kidney Injury (Base Crt 0.89) - note received single dose vancomycin 5/12, improved Hypernatremia - mild Hypokalemia  Volume Overload Patient is auto-diuresing at this point P:   Trend BMP / urinary output Replace electrolytes as indicated Avoid nephrotoxic agents, ensure adequate renal perfusion Hold further lasix Increase free water to 250 ml q6 via tube  GASTROINTESTINAL A:   Morbid Obesity  Nutrition  GI Prophylaxis  P:   PPI for SUP  TF per nutrition  HEMATOLOGIC A:    VTE Prophylaxis  Possible LLE DVT - see preliminary report  P:  F/U CBC IVC filter in place 5/17 by IR SCD's for DVT prophylaxis No anticoagulants given ICH   INFECTIOUS A:   Leukocytosis  Fever - 5/10, 5/15 Coag negative staph in one of 2 blood cultures, presumed skin contaminant P:   D/Cd cefepime 5/17, received 3 days. Follow fever curve / WBC trend. Monitor off abx. CXR improved 5/16 > doubt PNA, feel more likely de-recruitment / atelectasis + edema  ENDOCRINE A:   Hyperglycemia   P:   Follow glucose on BMP     FAMILY  - Updates:  No family bedside to update, discussed with bedside RN.  - Inter-disciplinary family meet or Palliative Care meeting due by: 5/15   The patient is critically ill with multiple organ systems failure and requires high complexity decision making for assessment and support, frequent evaluation and titration of therapies, application of advanced monitoring technologies and extensive interpretation of multiple databases.   Critical Care Time devoted to patient care  services described in this note is  35  Minutes. This time reflects time of care of this signee Dr Jennet Maduro. This critical care time does not reflect procedure time, or teaching time or supervisory time of PA/NP/Med student/Med Resident etc but could involve care discussion time.  Rush Farmer, M.D. [facility] Pulmonary/Critical Care Medicine. Pager: [phone]. After hours pager: [phone].

## 2016-12-12 NOTE — Progress Notes (Signed)
Transcranial Doppler  Date POD PCO2 HCT BP  MCA ACA PCA OPHT SIPH VERT Basilar  5/9 RS     Right Left * * * * * * 22 29 39 * -15 * *  5/11 JE     Right  Left   31  29   *  *   *  *   34  *   *  *   -27     *      5/14 JE     Right  Left   27  32   *  -25   *  *   44  48   50  64   *  *   *      5/16 JE     Right  Left   27  35   -25  -22   *  *   31  43   55  71   -46  *   *       5/18 JE     Right  Left   30  87   *  -29   *  *   37  48   48  59   *  -35   *            Right  Left                                            Right  Left                                            Right  Left                                        MCA = Middle Cerebral Artery      OPHT = Opthalmic Artery     BASILAR = Basilar Artery   ACA = Anterior Cerebral Artery     SIPH = Carotid Siphon PCA = Posterior Cerebral Artery   VERT = Verterbral Artery                   Normal MCA = 62+\-12 ACA = 50+\-12 PCA = 42+\-23   First TCD attempt 12/01/16 with no velocities obtained due to poor windows. RS  5/9 Very limited windows. RS  5/11 Very limited test due to body habitus and facial edema. Poor windows. Very few vessels insonated. Bilateral MCA high resistant-- unsure if this is correct vessel. JE  5/14 Very limited windows. JE  5/16 Limited study due to poor windows and patient movement. JE  5/17 Limited study due to poor windows and patient movement. JE    12/12/2016

## 2016-12-12 NOTE — Progress Notes (Signed)
Pt seen and examined.  No issues overnight.  EXAM: Temp:  [99.2 F (37.3 C)-100.2 F (37.9 C)] 99.2 F (37.3 C) (05/18 1415) Pulse Rate:  [70-106] 75 (05/18 1415) Resp:  [16-34] 30 (05/18 1415) BP: (106-216)/(65-157) 185/81 (05/18 1415) SpO2:  [94 %-100 %] 97 % (05/18 1415) FiO2 (%):  [40 %-50 %] 40 % (05/18 1126) Weight:  [210.5 kg (464 lb)] 210.5 kg (464 lb) (05/18 0500) Intake/Output      05/17 0701 - 05/18 0700 05/18 0701 - 05/19 0700   P.O.     I.V. (mL/kg) 2427.2 (11.5) 1415.6 (6.7)   NG/GT 725 760   IV Piggyback 100    Total Intake(mL/kg) 3252.2 (15.4) 2175.6 (10.3)   Urine (mL/kg/hr) 5250 (1) 1050 (0.7)   Stool 250 (0)    Total Output 5500 1050   Net -2247.8 +1125.6        Stool Occurrence 1 x     Awake, alert CN grossly intact  Moves Left side intermittently Antigravity RLE & RLE   Stable Continue current care

## 2016-12-12 NOTE — Progress Notes (Signed)
[facility] Tube Team Note:  RN alerted RD to potentially clogged tube. Tube placed 5/16 and secured at 91 cm. At this time tube was retraced and flushed without issue; remains secured at 91 cm.   No x-ray is required. RN may begin using tube.    If the tube becomes dislodged please keep the tube and contact the [facility] team at [email or web address] (password [email or web address]) for replacement.  If after hours and replacement cannot be delayed, place a NG tube and confirm placement with an abdominal x-ray.     Trenton GammonJessica Raeann Offner, MS, RD, LDN, [facility] Inpatient Clinical Dietitian Pager # [phone] After hours/weekend pager # [phone]

## 2016-12-13 ENCOUNTER — Inpatient Hospital Stay (HOSPITAL_COMMUNITY): Payer: BLUE CROSS/BLUE SHIELD

## 2016-12-13 LAB — BLOOD GAS, ARTERIAL
Acid-Base Excess: 1.8 mmol/L (ref 0.0–2.0)
Bicarbonate: 26.1 mmol/L (ref 20.0–28.0)
DRAWN BY: 419771
FIO2: 40
O2 SAT: 95.5 %
PCO2 ART: 43.6 mmHg (ref 32.0–48.0)
PEEP: 5 cmH2O
PO2 ART: 87.5 mmHg (ref 83.0–108.0)
Patient temperature: 98.6
Pressure control: 15 cmH2O
RATE: 15 resp/min
pH, Arterial: 7.396 (ref 7.350–7.450)

## 2016-12-13 LAB — CBC
HCT: 35.1 % — ABNORMAL LOW (ref 39.0–52.0)
Hemoglobin: 10.3 g/dL — ABNORMAL LOW (ref 13.0–17.0)
MCH: 26.5 pg (ref 26.0–34.0)
MCHC: 29.3 g/dL — AB (ref 30.0–36.0)
MCV: 90.5 fL (ref 78.0–100.0)
Platelets: 391 10*3/uL (ref 150–400)
RBC: 3.88 MIL/uL — ABNORMAL LOW (ref 4.22–5.81)
RDW: 15.7 % — ABNORMAL HIGH (ref 11.5–15.5)
WBC: 14.4 10*3/uL — AB (ref 4.0–10.5)

## 2016-12-13 LAB — GLUCOSE, CAPILLARY
GLUCOSE-CAPILLARY: 114 mg/dL — AB (ref 65–99)
GLUCOSE-CAPILLARY: 129 mg/dL — AB (ref 65–99)
GLUCOSE-CAPILLARY: 138 mg/dL — AB (ref 65–99)
GLUCOSE-CAPILLARY: 162 mg/dL — AB (ref 65–99)
GLUCOSE-CAPILLARY: 193 mg/dL — AB (ref 65–99)
Glucose-Capillary: 158 mg/dL — ABNORMAL HIGH (ref 65–99)

## 2016-12-13 LAB — MAGNESIUM: MAGNESIUM: 2.2 mg/dL (ref 1.7–2.4)

## 2016-12-13 LAB — C DIFFICILE QUICK SCREEN W PCR REFLEX
C DIFFICLE (CDIFF) ANTIGEN: NEGATIVE
C Diff interpretation: NOT DETECTED
C Diff toxin: NEGATIVE

## 2016-12-13 LAB — BASIC METABOLIC PANEL
ANION GAP: 10 (ref 5–15)
BUN: 20 mg/dL (ref 6–20)
CALCIUM: 9 mg/dL (ref 8.9–10.3)
CO2: 25 mmol/L (ref 22–32)
CREATININE: 1 mg/dL (ref 0.61–1.24)
Chloride: 118 mmol/L — ABNORMAL HIGH (ref 101–111)
Glucose, Bld: 174 mg/dL — ABNORMAL HIGH (ref 65–99)
Potassium: 3.4 mmol/L — ABNORMAL LOW (ref 3.5–5.1)
SODIUM: 153 mmol/L — AB (ref 135–145)

## 2016-12-13 LAB — PHOSPHORUS: PHOSPHORUS: 3 mg/dL (ref 2.5–4.6)

## 2016-12-13 MED ORDER — CHOLESTYRAMINE 4 G PO PACK
4.0000 g | PACK | Freq: Three times a day (TID) | ORAL | Status: DC
  Filled 2016-12-13 (×2): qty 1

## 2016-12-13 MED ORDER — POTASSIUM CHLORIDE 20 MEQ/15ML (10%) PO SOLN
40.0000 meq | Freq: Once | ORAL | Status: AC
  Administered 2016-12-13: 40 meq
  Filled 2016-12-13: qty 30

## 2016-12-13 MED ORDER — CHOLESTYRAMINE 4 G PO PACK
4.0000 g | PACK | Freq: Three times a day (TID) | ORAL | Status: DC
  Administered 2016-12-13 (×2): 4 g
  Filled 2016-12-13 (×5): qty 1

## 2016-12-13 NOTE — Procedures (Signed)
Arterial Catheter Insertion Procedure Note Bryan [name] [id number] 03-Sep-1980  Procedure: Insertion of Arterial Catheter  Indications: Blood pressure monitoring and Frequent blood sampling  Procedure Details Consent: Risks of procedure as well as the alternatives and risks of each were explained to the (patient/caregiver).  Consent for procedure obtained. Time Out: Verified patient identification, verified procedure, site/side was marked, verified correct patient position, special equipment/implants available, medications/allergies/relevent history reviewed, required imaging and test results available.  Performed  Maximum sterile technique was used including antiseptics, cap, gloves, gown, hand hygiene, mask and sheet. Skin prep: Chlorhexidine; local anesthetic administered 20 gauge catheter was inserted into right femoral artery using the Seldinger technique.  Evaluation Blood flow good; BP tracing good. Complications: No apparent complications.   Bryan [name],Bryan [name]  US  Placed then clotted removed Multiple other attempts, clotted, kink wire x 3 abborted  Bryan Rossettianiel J. [name] AliasFeinstein, MD, FACP Pgr: [phone] [facility]

## 2016-12-13 NOTE — Progress Notes (Addendum)
PULMONARY / CRITICAL CARE MEDICINE   Name: Bryan KaysDarren D Stipes MRN: [id number] DOB: Oct 24, 1980    ADMISSION DATE:  12/01/2016 CONSULTATION DATE:  12/02/2016  REFERRING MD:  Dr. Patric DykesNunkumar   CHIEF COMPLAINT:  SAH  BRIEF SUMMARY:  36 y.o.malewith PMH of morbid obesity, varicose veins who [facility] with sudden onset of headache that started earlier that morning. CTA demonstrated diffuse SAH over both convexities and extending into the basal cisterns. Admitted by neurosurgery with plans for angiogram 5/8 with possible clipping.  Subsequently developed worsening hypoxemia and increased WOB requiring intubation.    SUBJECTIVE:   Pressors required to high sys goals PC vent fent required  VITAL SIGNS: BP (!) 225/89   Pulse 69   Temp 99.1 F (37.3 C) (Rectal)   Resp (!) 25   Ht 5\' 11"  (1.803 m)   Wt (!) 210.5 kg (464 lb)   SpO2 100%   BMI 64.71 kg/m   HEMODYNAMICS:    VENTILATOR SETTINGS: Vent Mode: PCV FiO2 (%):  [40 %] 40 % Set Rate:  [15 bmp] 15 bmp PEEP:  [5 cmH20] 5 cmH20 Plateau Pressure:  [19 cmH20-27 cmH20] 26 cmH20  INTAKE / OUTPUT: I/O last 3 completed shifts: In: 9379.8 [I.V.:6319.8; NG/GT:2860; IV Piggyback:200] Out: 6875 [Urine:6050; Emesis/NG output:650; Stool:175]  PHYSICAL EXAMINATION: General: vent, calm, rass -1 Neuro: rass -1, fc, moves all ext HEENT: ett, obese PULM: anterior clear reduced CV: s1 s2 RRR not tachy unless agitation GI: obese, BS wnl, no r/g Extremities: edema  LABS:  BMET  Recent Labs Lab 12/11/16 0500 12/12/16 0559 12/13/16 0534  NA 154* 157* 153*  K 3.7 3.3* 3.4*  CL 116* 122* 118*  CO2 27 26 25   BUN 37* 24* 20  CREATININE 1.26* 1.13 1.00  GLUCOSE 259* 194* 174*   Electrolytes  Recent Labs Lab 12/11/16 0500 12/12/16 0559 12/13/16 0534  CALCIUM 9.0 9.2 9.0  MG 2.6* 2.4 2.2  PHOS 3.6 3.3 3.0   CBC  Recent Labs Lab 12/11/16 0500 12/12/16 0559 12/13/16 0534  WBC 17.7* 15.5* 14.4*  HGB 10.2* 10.4*  10.3*  HCT 35.4* 34.9* 35.1*  PLT 412* 411* 391   Coag's No results for input(s): APTT, INR in the last 168 hours. Sepsis Markers  Recent Labs Lab 12/09/16 0918 12/10/16 0500 12/11/16 0500  PROCALCITON 0.45 0.58 0.41   ABG  Recent Labs Lab 12/11/16 0400 12/12/16 0410 12/13/16 0351  PHART 7.365 7.423 7.396  PCO2ART 49.8* 40.9 43.6  PO2ART 128* 81.2* 87.5   Liver Enzymes No results for input(s): AST, ALT, ALKPHOS, BILITOT, ALBUMIN in the last 168 hours.  Cardiac Enzymes No results for input(s): TROPONINI, PROBNP in the last 168 hours.  Glucose  Recent Labs Lab 12/12/16 1205 12/12/16 1628 12/12/16 1954 12/12/16 2334 12/13/16 0316 12/13/16 0731  GLUCAP 166* 161* 154* 159* 158* 193*   Imaging CXR 5/15 >> interval worsening of left lung opacity, edema vs opacity/LLL  STUDIES: CTA head 5/7 >> diffuse SAH over both convexities and extending into the basal cisterns. CXR 5/10 >> Cardiomegaly with increasing bilateral airspace opacities which may reflect edema/atelectasis  ECHO 5/10 >> EF of 65-70% and LVH, poor image to determine diastolic dysfunction Transcranial dopplers 5/11 >> poor study LE Doppler 5/15 >> prelim results with concern for probable acute DVT in left popliteal vein, left posterior tibial veins   CULTURES: MRSA 5/8 > Negative  Blood 5/11 >> 1 of 2 GPC >> Coag-negative staph Tracheal aspirate 5/15 >> Moraxella  BC  5/15 >> NTD  ANTIBIOTICS: Vancomycin 1 given 5/12 Cefepime 5/15 (? New L infiltrate) >> 5/17  SIGNIFICANT EVENTS: 5/07  Admit with sudden headache, SAH. 5/08  PCCM consult > intubation >clipping of posterior communicating artery aneurysm  5/12  Vanc x1 for 1/2 coag neg staph, lasix gtt decreased, desats with WUA 5/13  Lasix gtt discontinued 5/14  Fever overnight to 102.8, pink frothy sputum late evening, difficulty oxygenating  5/15  LE duplex, concern for possible popliteal & tibial DVT   LINES/TUBES: ETT 5/8 >> Right  upper extremity PICC 5/9 >>   DISCUSSION: 36 y.o.maleadmitted 5/7 with SAH. Required intubation 5/8 due to hypoxemia and increased WOB.  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, s/p clipping  P:   RASS goal: 0 to -1 Post-op care per NSGY  Continue Nimodipine WUA mandatory Fentanyl gtt / versed for pain and anxiety PT efforts as able  TCD's per NSGY  D/C lopressor Consider MAP goals 80-85  PULMONARY A: Acute Hypoxic Respiratory Failure + Pulmonary Edema New L Airspace Disease - new 5/15, edema OSA/OHS P:   pcxr seems to have increased edema and was planned pos balance 1.8 liters, but remains on low O2 needs Consider slight reductions in intake to avoid worsening edema / o2 needs  Trach Monday Keep same MV pc 20 on vent, ph wnl Consider PS wean attempt with close observation TV alarms, apnea pcxr in am No diuresis with HHH now  CARDIOVASCULAR A:  HTN DVT P:  ICU monitoring of hemodynamics SBP goal 200, consider this to MAP 85 if a line placed with accuracy  Need a line if able PRN hydralazine, labetalol  Monitor I/O"s Nimodipine Levophed for BP target No role neo with levo high dose IVC filter placed by IR 5/17  Get cvp  RENAL A:   Acute Kidney Injury (Base Crt 0.89) - note received single dose vancomycin 5/12, improved Hypernatremia - mild Hypokalemia  Volume Overload Patient is auto-diuresing at this point P:   Am chem k supp 2 liters positive needed for HHH, maybe slight down as far as pos balance with pulm edema  GASTROINTESTINAL A:   Morbid Obesity  Nutrition  GI Prophylaxis  P:   PPI for SUP  TF per nutrition  HEMATOLOGIC A:   VTE Prophylaxis  Possible LLE DVT - see preliminary report  P:  F/U CBC in am with pos balance IVC filter in place 5/17 by IR SCD's for DVT prophylaxis No anticoagulants given ICH   INFECTIOUS A:   Leukocytosis  Fever - 5/10, 5/15 Coag  negative staph in one of 2 blood cultures, presumed skin contaminant P:   pcxr with edema Monitor temps  ENDOCRINE A:   Hyperglycemia   P:   Follow glucose on BMP q4h     FAMILY  - Updates:  I updated mom  - Inter-disciplinary family meet or Palliative Care meeting due by: 5/15   Ccm time 35 min   Mcarthur Rossetti. Tyson Alias, MD, FACP Pgr: [phone] [facility]

## 2016-12-13 NOTE — Progress Notes (Signed)
eLink Physician-Brief Progress Note Patient Name: Bryan KaysDarren D [name] DOB: 11/16/1980 MRN: [id number]   Date of Service  12/13/2016  HPI/Events of Note  Patient having liquid stools. Currently on ABx.   eICU Interventions  Will order: 1. C. Difficile PCR  panel. 2. Enteric precautions.  3. Questran 4 gm per tube TID X 15 doses.      Intervention Category Intermediate Interventions: Other:  Lenell AntuSommer,Elgin Carn Eugene 12/13/2016, 6:04 PM

## 2016-12-13 NOTE — Progress Notes (Signed)
Patient ID: Bryan [name], male   DOB: [birth year], 36 y.o.   MRN: [id number] Subjective: Patient remains intubated, just received versed  Objective: Vital signs in last 24 hours: Temp:  [99 F (37.2 C)-101.2 F (38.4 C)] 99.1 F (37.3 C) (05/19 0800) Pulse Rate:  [61-128] 69 (05/19 0915) Resp:  [18-37] 25 (05/19 0915) BP: (101-239)/(66-210) 225/89 (05/19 0915) SpO2:  [89 %-100 %] 100 % (05/19 0915) FiO2 (%):  [40 %] 40 % (05/19 0800) Weight:  [210.5 kg (464 lb)] 210.5 kg (464 lb) (05/19 0500)  Intake/Output from previous day: 05/18 0701 - 05/19 0700 In: 7374.2 [I.V.:4864.2; NG/GT:2360; IV Piggyback:150] Out: 5325 [Urine:4500; Emesis/NG output:650; Stool:175] Intake/Output this shift: Total I/O In: 724.1 [I.V.:624.1; NG/GT:100] Out: 600 [Urine:600]  opens eyes to voice, FC, MAEx4  Lab Results: Lab Results  Component Value Date   WBC 14.4 (H) 12/13/2016   HGB 10.3 (L) 12/13/2016   HCT 35.1 (L) 12/13/2016   MCV 90.5 12/13/2016   PLT 391 12/13/2016   Lab Results  Component Value Date   INR 1.02 12/01/2016   BMET Lab Results  Component Value Date   NA 153 (H) 12/13/2016   K 3.4 (L) 12/13/2016   CL 118 (H) 12/13/2016   CO2 25 12/13/2016   GLUCOSE 174 (H) 12/13/2016   BUN 20 12/13/2016   CREATININE 1.00 12/13/2016   CALCIUM 9.0 12/13/2016    Studies/Results: Ir Ivc Filter Plmt / S&i /img Guid/mod Sed  Result Date: 12/12/2016 CLINICAL DATA:  Subarachnoid hemorrhage, status post cerebral aneurysm clipping and documented left popliteal and posterior tibial vein DVT. IVC filter placement has been requested due to contraindication to receive anticoagulation currently. EXAM: 1. ULTRASOUND GUIDANCE FOR VASCULAR ACCESS OF THE RIGHT INTERNAL JUGULAR VEIN. 2. IVC VENOGRAM. 3. PERCUTANEOUS IVC FILTER PLACEMENT. ANESTHESIA/SEDATION: None CONTRAST:  30 mL Isovue-300 FLUOROSCOPY TIME:  1 minutes and 42 seconds.  194 mGy. PROCEDURE: The procedure, risks, benefits, and alternatives  were explained to the patient. Questions regarding the procedure were encouraged and answered. The patient understands and consents to the procedure. A time-out was performed prior to initiating the procedure. The right neck was prepped with chlorhexidine in a sterile fashion, and a sterile drape was applied covering the operative field. A sterile gown and sterile gloves were used for the procedure. Local anesthesia was provided with 1% Lidocaine. Ultrasound was utilized to confirm patency of the right internal jugular vein. Under direct ultrasound guidance, a 21 gauge needle was advanced into the right internal jugular vein with ultrasound image documentation performed. After securing access with a micropuncture dilator, a guidewire was advanced into the inferior vena cava. A deployment sheath was advanced over the guidewire. This was utilized to perform IVC venography. The deployment sheath was further positioned in an appropriate location for filter deployment. A Bard Denali IVC filter was then advanced in the sheath. This was then fully deployed in the infrarenal IVC. Final filter position was confirmed with a fluoroscopic spot image. After the procedure the sheath was removed and hemostasis obtained with manual compression. COMPLICATIONS: None. FINDINGS: IVC venography demonstrates a normal caliber IVC with no evidence of thrombus. The IVC filter was successfully positioned below the level of the renal veins and is appropriately oriented. This IVC filter has both permanent and retrievable indications. IMPRESSION: Placement of percutaneous IVC filter in infrarenal IVC. IVC venogram shows no evidence of IVC thrombus and normal caliber of the inferior vena cava. This filter does have both permanent and retrievable indications. PLAN: This  IVC filter is potentially retrievable. The patient will be assessed for filter retrieval by Interventional Radiology in approximately 8-12 weeks. Further recommendations regarding  filter retrieval, continued surveillance or declaration of device permanence, will be made at that time. Electronically Signed   By: Irish Lack M.D.   On: 12/12/2016 10:23   Dg Chest Port 1 View  Result Date: 12/13/2016 CLINICAL DATA:  ET tube EXAM: PORTABLE CHEST 1 VIEW COMPARISON:  12/12/2016 FINDINGS: Support devices including endotracheal tube are unchanged. Diffuse bilateral airspace disease has worsened since prior study. Low lung volumes. Mild cardiomegaly. IMPRESSION: Cardiomegaly with worsening bilateral airspace disease. Low lung volumes. Electronically Signed   By: Charlett Nose M.D.   On: 12/13/2016 07:55   Dg Chest Port 1 View  Result Date: 12/12/2016 CLINICAL DATA:  Intubation. EXAM: PORTABLE CHEST 1 VIEW COMPARISON:  12/11/2016 . FINDINGS: Endotracheal tube, feeding tube, PICC line in stable position. Heart size normal. Diffuse bilateral pulmonary alveolar infiltrates/edema again noted. No pleural effusion or pneumothorax. IMPRESSION: 1.  Lines and tubes in stable position. 2. Cardiomegaly with diffuse bilateral pulmonary infiltrates/edema again noted. Electronically Signed   By: Maisie Fus  Register   On: 12/12/2016 07:26   Dg Abd Portable 1v  Result Date: 12/11/2016 CLINICAL DATA:  Confirm OG tube placement EXAM: PORTABLE ABDOMEN - 1 VIEW COMPARISON:  None. FINDINGS: There appear to be 2 tubes in the stomach. The first is a feeding tube coiled in the stomach with the distal tip directed to the left within the body of the stomach. A standard NG tube is also identified with the side port and distal tips in the stomach. IMPRESSION: Both the feeding and NG tubes terminate in the stomach. Electronically Signed   By: Gerome Sam III M.D   On: 12/11/2016 19:07    Assessment/Plan: Seems stable, continue HHH   LOS: 12 days    Bryan [name] S 12/13/2016, 9:26 AM

## 2016-12-14 ENCOUNTER — Inpatient Hospital Stay (HOSPITAL_COMMUNITY): Payer: BLUE CROSS/BLUE SHIELD

## 2016-12-14 DIAGNOSIS — R51 Headache: Secondary | ICD-10-CM

## 2016-12-14 DIAGNOSIS — R55 Syncope and collapse: Secondary | ICD-10-CM

## 2016-12-14 DIAGNOSIS — R519 Headache, unspecified: Secondary | ICD-10-CM

## 2016-12-14 LAB — PHOSPHORUS: Phosphorus: 3.5 mg/dL (ref 2.5–4.6)

## 2016-12-14 LAB — CBC WITH DIFFERENTIAL/PLATELET
BASOS ABS: 0 10*3/uL (ref 0.0–0.1)
Basophils Relative: 0 %
Eosinophils Absolute: 0.5 10*3/uL (ref 0.0–0.7)
Eosinophils Relative: 4 %
HCT: 32.4 % — ABNORMAL LOW (ref 39.0–52.0)
Hemoglobin: 9.2 g/dL — ABNORMAL LOW (ref 13.0–17.0)
LYMPHS ABS: 1.6 10*3/uL (ref 0.7–4.0)
Lymphocytes Relative: 14 %
MCH: 25.8 pg — ABNORMAL LOW (ref 26.0–34.0)
MCHC: 28.4 g/dL — AB (ref 30.0–36.0)
MCV: 90.8 fL (ref 78.0–100.0)
MONO ABS: 0.6 10*3/uL (ref 0.1–1.0)
Monocytes Relative: 5 %
Neutro Abs: 8.6 10*3/uL — ABNORMAL HIGH (ref 1.7–7.7)
Neutrophils Relative %: 77 %
Platelets: 307 10*3/uL (ref 150–400)
RBC: 3.57 MIL/uL — AB (ref 4.22–5.81)
RDW: 15.5 % (ref 11.5–15.5)
WBC: 11.3 10*3/uL — AB (ref 4.0–10.5)

## 2016-12-14 LAB — BASIC METABOLIC PANEL
Anion gap: 9 (ref 5–15)
BUN: 23 mg/dL — AB (ref 6–20)
CALCIUM: 9.2 mg/dL (ref 8.9–10.3)
CO2: 26 mmol/L (ref 22–32)
CREATININE: 0.82 mg/dL (ref 0.61–1.24)
Chloride: 115 mmol/L — ABNORMAL HIGH (ref 101–111)
GFR calc Af Amer: >60 mL/min (ref >60)
Glucose, Bld: 140 mg/dL — ABNORMAL HIGH (ref 65–99)
Potassium: 3.7 mmol/L (ref 3.5–5.1)
SODIUM: 150 mmol/L — AB (ref 135–145)

## 2016-12-14 LAB — CULTURE, BLOOD (ROUTINE X 2)
CULTURE: NO GROWTH
Culture: NO GROWTH
SPECIAL REQUESTS: ADEQUATE
Special Requests: ADEQUATE

## 2016-12-14 LAB — GLUCOSE, CAPILLARY
GLUCOSE-CAPILLARY: 123 mg/dL — AB (ref 65–99)
GLUCOSE-CAPILLARY: 102 mg/dL — AB (ref 65–99)
GLUCOSE-CAPILLARY: 97 mg/dL (ref 65–99)
Glucose-Capillary: 109 mg/dL — ABNORMAL HIGH (ref 65–99)
Glucose-Capillary: 111 mg/dL — ABNORMAL HIGH (ref 65–99)
Glucose-Capillary: 126 mg/dL — ABNORMAL HIGH (ref 65–99)

## 2016-12-14 LAB — MAGNESIUM: Magnesium: 2.4 mg/dL (ref 1.7–2.4)

## 2016-12-14 MED ORDER — LOPERAMIDE HCL 1 MG/5ML PO LIQD
4.0000 mg | ORAL | Status: DC | PRN
  Administered 2016-12-14 – 2016-12-19 (×7): 4 mg
  Filled 2016-12-14 (×8): qty 20

## 2016-12-14 NOTE — Progress Notes (Signed)
Patient ID: Bryan [name], male   DOB: Mar 21, 1981, 36 y.o.   MRN: [id number] Subjective: Patient remains intubated. He opens his eyes today and follows commands with all 4 extremities. Seems to move everything equally. Incision looks good.  Objective: Vital signs in last 24 hours: Temp:  [98.7 F (37.1 C)-99.9 F (37.7 C)] 98.7 F (37.1 C) (05/20 0756) Pulse Rate:  [60-97] 80 (05/20 0900) Resp:  [12-32] 21 (05/20 0900) BP: (118-236)/(40-125) 143/76 (05/20 0900) SpO2:  [97 %-100 %] 100 % (05/20 0900) FiO2 (%):  [30 %-40 %] 30 % (05/20 0800) Weight:  [193.2 kg (426 lb)] 193.2 kg (426 lb) (05/20 0445)  Intake/Output from previous day: 05/19 0701 - 05/20 0700 In: 4138.2 [I.V.:1478.2; NG/GT:2510; IV Piggyback:150] Out: 3730 [Urine:3505; Stool:225] Intake/Output this shift: No intake/output data recorded.    Lab Results: Lab Results  Component Value Date   WBC 11.3 (H) 12/14/2016   HGB 9.2 (L) 12/14/2016   HCT 32.4 (L) 12/14/2016   MCV 90.8 12/14/2016   PLT 307 12/14/2016   Lab Results  Component Value Date   INR 1.02 12/01/2016   BMET Lab Results  Component Value Date   NA 150 (H) 12/14/2016   K 3.7 12/14/2016   CL 115 (H) 12/14/2016   CO2 26 12/14/2016   GLUCOSE 140 (H) 12/14/2016   BUN 23 (H) 12/14/2016   CREATININE 0.82 12/14/2016   CALCIUM 9.2 12/14/2016    Studies/Results: Dg Chest Port 1 View  Result Date: 12/14/2016 CLINICAL DATA:  Respiratory failure EXAM: PORTABLE CHEST 1 VIEW COMPARISON:  12/13/2016 FINDINGS: Support devices are stable. Cardiomegaly with vascular congestion, improved since prior study. No confluent opacities or effusions. IMPRESSION: Cardiomegaly, improving vascular congestion Electronically Signed   By: Charlett NoseKevin  Dover M.D.   On: 12/14/2016 07:38   Dg Chest Port 1 View  Result Date: 12/13/2016 CLINICAL DATA:  ET tube EXAM: PORTABLE CHEST 1 VIEW COMPARISON:  12/12/2016 FINDINGS: Support devices including endotracheal tube are unchanged.  Diffuse bilateral airspace disease has worsened since prior study. Low lung volumes. Mild cardiomegaly. IMPRESSION: Cardiomegaly with worsening bilateral airspace disease. Low lung volumes. Electronically Signed   By: Charlett NoseKevin  Dover M.D.   On: 12/13/2016 07:55    Assessment/Plan: Overall appears to be somewhat better from a mental status standpoint this morning. Continue current management   LOS: 13 days    Nasha Diss S 12/14/2016, 9:22 AM

## 2016-12-14 NOTE — Progress Notes (Addendum)
PULMONARY / CRITICAL CARE MEDICINE   Name: Bryan KaysDarren D Forse MRN: [id number] DOB: 07-29-1980    ADMISSION DATE:  12/01/2016 CONSULTATION DATE:  12/02/2016  REFERRING MD:  Dr. Patric DykesNunkumar   CHIEF COMPLAINT:  SAH  BRIEF SUMMARY:  36 y.o.malewith PMH of morbid obesity, varicose veins who [facility]/01/16 with sudden onset of headache that started earlier that morning. CTA demonstrated diffuse SAH over both convexities and extending into the basal cisterns. Admitted by neurosurgery with plans for angiogram 5/8 with possible clipping.  Subsequently developed worsening hypoxemia and increased WOB requiring intubation.    SUBJECTIVE:   Awake, follows commands Levo down to 20 Weaning on vent this am  cvp 7  800 cc pos  VITAL SIGNS: BP (!) 143/76   Pulse 80   Temp 98.7 F (37.1 C) (Rectal)   Resp (!) 21   Ht 5\' 11"  (1.803 m)   Wt (!) 193.2 kg (426 lb)   SpO2 100%   BMI 59.41 kg/m   HEMODYNAMICS: CVP:  [5 mmHg-15 mmHg] 8 mmHg  VENTILATOR SETTINGS: Vent Mode: CPAP;PSV FiO2 (%):  [30 %-40 %] 30 % Set Rate:  [15 bmp] 15 bmp PEEP:  [5 cmH20] 5 cmH20 Pressure Support:  [10 cmH20] 10 cmH20 Plateau Pressure:  [21 cmH20-26 cmH20] 21 cmH20  INTAKE / OUTPUT: I/O last 3 completed shifts: In: 7629.6 [I.V.:3769.6; NG/GT:3610; IV Piggyback:250] Out: 6580 [Urine:6305; Stool:275]  PHYSICAL EXAMINATION: General: awakes, follows commands, calm Neuro: nonfocal, fc well, appropriate HEENT: obese, limited neck, tongue swollen PULM: coarse  CV:  s1 s2 RRR not tachy on levo 20 GI: soft, bs no r/g Extremities:  Edema 1 plus   LABS:  BMET  Recent Labs Lab 12/12/16 0559 12/13/16 0534 12/14/16 0501  NA 157* 153* 150*  K 3.3* 3.4* 3.7  CL 122* 118* 115*  CO2 26 25 26   BUN 24* 20 23*  CREATININE 1.13 1.00 0.82  GLUCOSE 194* 174* 140*   Electrolytes  Recent Labs Lab 12/12/16 0559 12/13/16 0534 12/14/16 0501  CALCIUM 9.2 9.0 9.2  MG 2.4 2.2 2.4  PHOS 3.3 3.0 3.5    CBC  Recent Labs Lab 12/12/16 0559 12/13/16 0534 12/14/16 0501  WBC 15.5* 14.4* 11.3*  HGB 10.4* 10.3* 9.2*  HCT 34.9* 35.1* 32.4*  PLT 411* 391 307   Coag's No results for input(s): APTT, INR in the last 168 hours. Sepsis Markers  Recent Labs Lab 12/09/16 0918 12/10/16 0500 12/11/16 0500  PROCALCITON 0.45 0.58 0.41   ABG  Recent Labs Lab 12/11/16 0400 12/12/16 0410 12/13/16 0351  PHART 7.365 7.423 7.396  PCO2ART 49.8* 40.9 43.6  PO2ART 128* 81.2* 87.5   Liver Enzymes No results for input(s): AST, ALT, ALKPHOS, BILITOT, ALBUMIN in the last 168 hours.  Cardiac Enzymes No results for input(s): TROPONINI, PROBNP in the last 168 hours.  Glucose  Recent Labs Lab 12/13/16 1135 12/13/16 1533 12/13/16 1930 12/13/16 2334 12/14/16 0317 12/14/16 0745  GLUCAP 162* 129* 138* 114* 109* 123*   Imaging CXR 5/15 >> interval worsening of left lung opacity, edema vs opacity/LLL  STUDIES: CTA head 5/7 >> diffuse SAH over both convexities and extending into the basal cisterns. CXR 5/10 >> Cardiomegaly with increasing bilateral airspace opacities which may reflect edema/atelectasis  ECHO 5/10 >> EF of 65-70% and LVH, poor image to determine diastolic dysfunction Transcranial dopplers 5/11 >> poor study LE Doppler 5/15 >> prelim results with concern for probable acute DVT in left popliteal vein, left posterior tibial veins  CULTURES: MRSA 5/8 > Negative  Blood 5/11 >> 1 of 2 GPC >> Coag-negative staph Tracheal aspirate 5/15 >> Moraxella  BC 5/15 >> NTD  ANTIBIOTICS: Vancomycin 1 given 5/12 Cefepime 5/15 (? New L infiltrate) >> 5/17  SIGNIFICANT EVENTS: 5/07  Admit with sudden headache, SAH. 5/08  PCCM consult > intubation >clipping of posterior communicating artery aneurysm  5/12  Vanc x1 for 1/2 coag neg staph, lasix gtt decreased, desats with WUA 5/13  Lasix gtt discontinued 5/14  Fever overnight to 102.8, pink frothy sputum late evening, difficulty  oxygenating  5/15  LE duplex, concern for possible popliteal & tibial DVT   LINES/TUBES: ETT 5/8 >> Right upper extremity PICC 5/9 >>   DISCUSSION: 36 y.o.maleadmitted 5/7 with SAH. Required intubation 5/8 due to hypoxemia and increased WOB.  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, s/p clipping  P:   RASS goal: 0  Post-op care per NSGY  Continue Nimodipine WUA on going Fentanyl gtt, goal to 75 if able PT as able TCD's per NSGY ono Monday with good neurostatus at changed MAP goals  D/C lopressor Consider MAP goals 85 ( failed aline attempt again but we are almost at 2 weeks post craniotomy , so vasospasm risk hope to reduce now--> hence MAP goals different from sys 200 goals when cuff unknown accuracy)  PULMONARY A: Acute Hypoxic Respiratory Failure + Pulmonary Edema- improved New L Airspace Disease - new 5/15, edema OSA/OHS P:   Weaning aggressive cpap 5 ps 5, goal 2 hours Still requires PS 10, tongue swelling noted lower Get leak test Still leaning toward trach placement monday pcxr in am, today is improved No diuresis with HHH now  CARDIOVASCULAR A:  HTN DVT P:  ICU monitoring of hemodynamics Monitor I/O"s Nimodipine, consider lower dose with levo needs Levophed for BP target - map 85, likely to lower after TCD IVC filter placed by IR 5/17   RENAL A:   Acute Kidney Injury (Base Crt 0.89) - note received single dose vancomycin 5/12, improved Hypernatremia - mild Volume Overload P:   Am chem, na improving  GASTROINTESTINAL A:   Morbid Obesity  Nutrition  GI Prophylaxis  Loose stools, cdiff neg P:   PPI for SUP  TF per nutrition immodium  HEMATOLOGIC A:   VTE Prophylaxis  Possible LLE DVT - see preliminary report  P:  F/U CBC am with 800 cc pos IVC filter in place 5/17 by IR SCD's for DVT prophylaxis  INFECTIOUS A:   Leukocytosis  Fever - 5/10, 5/15 Coag negative  staph in one of 2 blood cultures, presumed skin contaminant P:   Monitor temps Last pct down  Add stop date empiric cef for 21st  ENDOCRINE A:   Hyperglycemia   P:   Follow glucose on BMP q4h     FAMILY  - Updates:  I updated pt  - Inter-disciplinary family meet or Palliative Care meeting due by: 5/15   Ccm time 30 min   Mcarthur Rossetti. Tyson Alias, MD, FACP Pgr: [phone] [facility]

## 2016-12-15 ENCOUNTER — Encounter (HOSPITAL_COMMUNITY)
Admission: EM | Disposition: A | Discharge status: Rehabilitation Facility | Payer: Self-pay | Source: Home / Self Care | Attending: Neurosurgery

## 2016-12-15 ENCOUNTER — Inpatient Hospital Stay (HOSPITAL_COMMUNITY): Payer: BLUE CROSS/BLUE SHIELD

## 2016-12-15 ENCOUNTER — Inpatient Hospital Stay (HOSPITAL_COMMUNITY): Payer: BLUE CROSS/BLUE SHIELD | Admitting: Certified Registered"

## 2016-12-15 DIAGNOSIS — Z4659 Encounter for fitting and adjustment of other gastrointestinal appliance and device: Secondary | ICD-10-CM

## 2016-12-15 DIAGNOSIS — I609 Nontraumatic subarachnoid hemorrhage, unspecified: Secondary | ICD-10-CM

## 2016-12-15 DIAGNOSIS — R51 Headache: Secondary | ICD-10-CM

## 2016-12-15 DIAGNOSIS — R55 Syncope and collapse: Secondary | ICD-10-CM

## 2016-12-15 HISTORY — PX: TRACHEOSTOMY TUBE PLACEMENT: SHX814

## 2016-12-15 LAB — GLUCOSE, CAPILLARY
GLUCOSE-CAPILLARY: 134 mg/dL — AB (ref 65–99)
Glucose-Capillary: 133 mg/dL — ABNORMAL HIGH (ref 65–99)
Glucose-Capillary: 98 mg/dL (ref 65–99)
Glucose-Capillary: 134 mg/dL — ABNORMAL HIGH (ref 65–99)
Glucose-Capillary: 127 mg/dL — ABNORMAL HIGH (ref 65–99)

## 2016-12-15 LAB — BASIC METABOLIC PANEL
Anion gap: 7 (ref 5–15)
BUN: 25 mg/dL — AB (ref 6–20)
CALCIUM: 9 mg/dL (ref 8.9–10.3)
CO2: 27 mmol/L (ref 22–32)
CREATININE: 0.84 mg/dL (ref 0.61–1.24)
Chloride: 113 mmol/L — ABNORMAL HIGH (ref 101–111)
GFR calc Af Amer: >60 mL/min (ref >60)
GFR calc non Af Amer: >60 mL/min (ref >60)
Glucose, Bld: 116 mg/dL — ABNORMAL HIGH (ref 65–99)
Potassium: 3.9 mmol/L (ref 3.5–5.1)
SODIUM: 147 mmol/L — AB (ref 135–145)

## 2016-12-15 LAB — CBC
HEMATOCRIT: 31.8 % — AB (ref 39.0–52.0)
Hemoglobin: 9.2 g/dL — ABNORMAL LOW (ref 13.0–17.0)
MCH: 26.4 pg (ref 26.0–34.0)
MCHC: 28.9 g/dL — AB (ref 30.0–36.0)
MCV: 91.1 fL (ref 78.0–100.0)
PLATELETS: 312 10*3/uL (ref 150–400)
RBC: 3.49 MIL/uL — ABNORMAL LOW (ref 4.22–5.81)
RDW: 15.3 % (ref 11.5–15.5)
WBC: 9.6 10*3/uL (ref 4.0–10.5)

## 2016-12-15 LAB — OSMOLALITY, URINE: OSMOLALITY UR: 780 mosm/kg (ref 300–900)

## 2016-12-15 LAB — OSMOLALITY: Osmolality: 320 mOsm/kg — ABNORMAL HIGH (ref 275–295)

## 2016-12-15 LAB — SODIUM, URINE, RANDOM: SODIUM UR: 120 mmol/L

## 2016-12-15 SURGERY — CREATION, TRACHEOSTOMY
Anesthesia: General | Site: Neck

## 2016-12-15 MED ORDER — MIDAZOLAM HCL 2 MG/2ML IJ SOLN
INTRAMUSCULAR | Status: AC
  Filled 2016-12-15: qty 2

## 2016-12-15 MED ORDER — 0.9 % SODIUM CHLORIDE (POUR BTL) OPTIME
TOPICAL | Status: DC | PRN
  Administered 2016-12-15: 1000 mL

## 2016-12-15 MED ORDER — ROCURONIUM BROMIDE 100 MG/10ML IV SOLN
INTRAVENOUS | Status: DC | PRN
  Administered 2016-12-15: 90 mg via INTRAVENOUS
  Administered 2016-12-15: 30 mg via INTRAVENOUS
  Administered 2016-12-15: 40 mg via INTRAVENOUS

## 2016-12-15 MED ORDER — MIDAZOLAM HCL 5 MG/5ML IJ SOLN
INTRAMUSCULAR | Status: DC | PRN
  Administered 2016-12-15 (×3): 2 mg via INTRAVENOUS

## 2016-12-15 MED ORDER — PHENYLEPHRINE HCL 10 MG/ML IJ SOLN
INTRAMUSCULAR | Status: DC | PRN
  Administered 2016-12-15 (×2): 80 ug via INTRAVENOUS

## 2016-12-15 MED ORDER — LIDOCAINE-EPINEPHRINE 1 %-1:100000 IJ SOLN
INTRAMUSCULAR | Status: AC
  Filled 2016-12-15: qty 1

## 2016-12-15 MED ORDER — FENTANYL CITRATE (PF) 250 MCG/5ML IJ SOLN
INTRAMUSCULAR | Status: AC
  Filled 2016-12-15: qty 5

## 2016-12-15 MED ORDER — PANCRELIPASE (LIP-PROT-AMYL) 12000-38000 UNITS PO CPEP
36000.0000 [IU] | ORAL_CAPSULE | Freq: Once | ORAL | Status: DC
  Filled 2016-12-15: qty 1

## 2016-12-15 MED ORDER — FENTANYL CITRATE (PF) 100 MCG/2ML IJ SOLN
INTRAMUSCULAR | Status: DC | PRN
  Administered 2016-12-15 (×3): 50 ug via INTRAVENOUS

## 2016-12-15 MED ORDER — SODIUM BICARBONATE 650 MG PO TABS
650.0000 mg | ORAL_TABLET | Freq: Once | ORAL | Status: DC
  Filled 2016-12-15: qty 1

## 2016-12-15 MED ORDER — CHLORHEXIDINE GLUCONATE 0.12 % MT SOLN
15.0000 mL | Freq: Two times a day (BID) | OROMUCOSAL | Status: DC
  Administered 2016-12-16 – 2016-12-26 (×18): 15 mL via OROMUCOSAL
  Filled 2016-12-15 (×17): qty 15

## 2016-12-15 MED ORDER — LACTATED RINGERS IV SOLN
INTRAVENOUS | Status: DC | PRN
  Administered 2016-12-15: 10:00:00 via INTRAVENOUS

## 2016-12-15 MED ORDER — ORAL CARE MOUTH RINSE
15.0000 mL | Freq: Two times a day (BID) | OROMUCOSAL | Status: DC
  Administered 2016-12-16 – 2016-12-25 (×19): 15 mL via OROMUCOSAL

## 2016-12-15 SURGICAL SUPPLY — 42 items
APL SKNCLS STERI-STRIP NONHPOA (GAUZE/BANDAGES/DRESSINGS)
BENZOIN TINCTURE PRP APPL 2/3 (GAUZE/BANDAGES/DRESSINGS) IMPLANT
BLADE CLIPPER SURG (BLADE) IMPLANT
BLADE SURG 15 STRL LF DISP TIS (BLADE) IMPLANT
BLADE SURG 15 STRL SS (BLADE) ×3
CANISTER SUCT 3000ML PPV (MISCELLANEOUS) ×3 IMPLANT
CLEANER TIP ELECTROSURG 2X2 (MISCELLANEOUS) ×3 IMPLANT
COVER SURGICAL LIGHT HANDLE (MISCELLANEOUS) ×3 IMPLANT
DECANTER SPIKE VIAL GLASS SM (MISCELLANEOUS) ×3 IMPLANT
DRAPE HALF SHEET 40X57 (DRAPES) ×2 IMPLANT
DRAPE ORTHO SPLIT 77X108 STRL (DRAPES) ×3
DRAPE SURG ORHT 6 SPLT 77X108 (DRAPES) IMPLANT
ELECT COATED BLADE 2.86 ST (ELECTRODE) ×3 IMPLANT
ELECT REM PT RETURN 9FT ADLT (ELECTROSURGICAL) ×3
ELECTRODE REM PT RTRN 9FT ADLT (ELECTROSURGICAL) ×1 IMPLANT
GAUZE SPONGE 4X4 16PLY XRAY LF (GAUZE/BANDAGES/DRESSINGS) ×3 IMPLANT
GLOVE ECLIPSE 7.5 STRL STRAW (GLOVE) ×3 IMPLANT
GOWN STRL REUS W/ TWL LRG LVL3 (GOWN DISPOSABLE) ×2 IMPLANT
GOWN STRL REUS W/TWL LRG LVL3 (GOWN DISPOSABLE) ×6
KIT BASIN OR (CUSTOM PROCEDURE TRAY) ×3 IMPLANT
KIT ROOM TURNOVER OR (KITS) ×3 IMPLANT
NDL PRECISIONGLIDE 27X1.5 (NEEDLE) ×1 IMPLANT
NEEDLE PRECISIONGLIDE 27X1.5 (NEEDLE) ×3 IMPLANT
NS IRRIG 1000ML POUR BTL (IV SOLUTION) ×3 IMPLANT
PACK EENT II TURBAN DRAPE (CUSTOM PROCEDURE TRAY) ×3 IMPLANT
PAD ARMBOARD 7.5X6 YLW CONV (MISCELLANEOUS) ×6 IMPLANT
PENCIL FOOT CONTROL (ELECTRODE) ×3 IMPLANT
SPONGE DRAIN TRACH 4X4 STRL 2S (GAUZE/BANDAGES/DRESSINGS) ×2 IMPLANT
SUT CHROMIC 2 0 SH (SUTURE) ×3 IMPLANT
SUT ETHILON 3 0 PS 1 (SUTURE) ×3 IMPLANT
SUT SILK 3 0 SH 30 (SUTURE) ×2 IMPLANT
SUT SILK 4 0 (SUTURE) ×3
SUT SILK 4 0 TIE 10X30 (SUTURE) ×3 IMPLANT
SUT SILK 4-0 18XBRD TIE 12 (SUTURE) ×1 IMPLANT
SYR 20CC LL (SYRINGE) ×3 IMPLANT
SYR CONTROL 10ML LL (SYRINGE) ×2 IMPLANT
TOWEL OR 17X24 6PK STRL BLUE (TOWEL DISPOSABLE) ×3 IMPLANT
TUBE CONNECTING 12'X1/4 (SUCTIONS) ×1
TUBE CONNECTING 12X1/4 (SUCTIONS) ×2 IMPLANT
TUBE TRACH 8.0 EXL PROX  CUF (TUBING) ×2
TUBE TRACH 8.0 EXL PROX CUF (TUBING) IMPLANT
WATER STERILE IRR 1000ML POUR (IV SOLUTION) ×3 IMPLANT

## 2016-12-15 NOTE — Progress Notes (Signed)
Wasted 150ml of fentanyl from bag into sink.  Witnessed by Kathlene CoteJamie Armstrong, RN.

## 2016-12-15 NOTE — Care Management Note (Signed)
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  PTA, pt independent, lives with mother and step-father.                    Action/Plan: Pt currently intubated; will follow for discharge planning as pt progresses.   Expected Discharge Date:                  Expected Discharge Plan:  IP Rehab Facility  In-House Referral:     Discharge planning Services  CM Consult  Post Acute Care Choice:    Choice offered to:     DME Arranged:    DME Agency:     HH Arranged:    HH Agency:     Status of Service:  In process, will continue to follow  If discussed at Long Length of Stay Meetings, dates discussed:    Additional Comments:  12/15/16 J. Warrick Llera, RN, BSN Pt s/p tracheostomy today in OR with goal to wean to trach collar.  Will follow progress.    Quintella BatonJulie W. Dalani Mette, RN, BSN  Trauma/Neuro ICU Case Manager [phone]

## 2016-12-15 NOTE — Op Note (Signed)
12/15/2016 11:35 AM   PATIENT:  Bryan [name], 36 y.o. male  PRE-OPERATIVE DIAGNOSIS:  Respiratory Failure  POST-OPERATIVE DIAGNOSIS:  Respiratory Failure   PROCEDURE:  Procedure(s): TRACHEOSTOMY  SURGEON:  Surgeon(s): Susy FrizzleJefry H Loyola Santino, MD  ASSISTANTS: none   ANESTHESIA:   general  EBL: Minimal   DRAINS: none   LOCAL MEDICATIONS USED:  NONE  COUNTS CORRECT:  YES  PROCEDURE DETAILS: Patient was taken to the operating room and placed on the operating table in the supine position. The patient was previously orally intubated. The neck was prepped and draped in a standard fashion. Tape was used to hold the submental fat up superiorly and out of the surgical field.  A vertical incision was created just above the sternal notch using electrocautery. The midline fascia was divided. The isthmus of the thyroid was divided with electrocautery and several small bleeders were encountered which were treated with suture ligatures, 3-0 silk, and the upper trachea was exposed. A tracheotomy was created between the second and third tracheal rings in a horizontal fashion. A lower tracheal flap was created with scissors and the flap was sutured to the cervical skin using 2-0 chromic suture. The orotracheal tube was removed. The #8 Shiley XLT tracheostomy tube was placed without difficulty and the cuff was inflated. The shield was secured to the neck using a Velcro straps and nylon suture. The patient was then transferred back to the intensive care unit in critical condition.  PLAN OF CARE: Transfer to ICU  PATIENT DISPOSITION:  ICU - hemodynamically stable.

## 2016-12-15 NOTE — Progress Notes (Signed)
RE: TCD  First attempt of TCD was 12/01/16. Most recent TCD was just completed today. This has been a very limited study/ mostly non-diagnostic each time. Please advise if TCD for 5/23 is still needed.  Vascular lab [phone]   Farrel DemarkJill Eunice, RDMS, RVT 12/15/2016

## 2016-12-15 NOTE — Transfer of Care (Signed)
Immediate Anesthesia Transfer of Care Note  Patient: Bryan [name]  Procedure(s) Performed: Procedure(s): TRACHEOSTOMY (N/A)  Patient Location: ICU  Anesthesia Type:General  Level of Consciousness: sedated  Airway & Oxygen Therapy: Patient remains intubated per anesthesia plan and Patient placed on Ventilator (see vital sign flow sheet for setting)  Post-op Assessment: Report given to RN and Post -op Vital signs reviewed and stable  Post vital signs: Reviewed and stable  Last Vitals:  Vitals:   12/15/16 0900 12/15/16 1000  BP: 138/61 113/60  Pulse: 70 72  Resp: 18 17  Temp:      Last Pain:  Vitals:   12/15/16 0200  TempSrc: Rectal         Complications: No apparent anesthesia complications

## 2016-12-15 NOTE — Progress Notes (Signed)
This note also relates to the following rows which could not be included: Resp - Cannot attach notes to unvalidated device data  Pt arrived back from OR and placed on vent at 1200.

## 2016-12-15 NOTE — Progress Notes (Signed)
PULMONARY / CRITICAL CARE MEDICINE   Name: Bryan [name] MRN: [id number] DOB: 04-Jun-1981    ADMISSION DATE:  12/01/2016 CONSULTATION DATE:  12/02/2016  REFERRING MD:  Dr. Ralene Ok   CHIEF COMPLAINT:  SAH  BRIEF SUMMARY:  36 y.o.malewith PMH of morbid obesity, varicose veins who presented to [facility] ED 12/01/16 with sudden onset of headache that started earlier that morning. CTA demonstrated diffuse SAH over both convexities and extending into the basal cisterns. Admitted by neurosurgery with plans for angiogram 5/8 with possible clipping.  Subsequently developed worsening hypoxemia and increased WOB requiring intubation.    SUBJECTIVE:   MAP 80-85 met, levophed down to 10 mic fent at 100 fc well For trach 915 am Neg 784  VITAL SIGNS: BP (!) 145/72   Pulse 81   Temp 99.1 F (37.3 C) (Rectal)   Resp 15   Ht '5\' 11"'$  (1.803 m)   Wt (!) 195 kg (430 lb)   SpO2 100%   BMI 59.97 kg/m   HEMODYNAMICS: CVP:  [7 mmHg-11 mmHg] 8 mmHg  VENTILATOR SETTINGS: Vent Mode: PCV FiO2 (%):  [30 %] 30 % Set Rate:  [15 bmp] 15 bmp PEEP:  [5 cmH20] 5 cmH20 Pressure Support:  [5 cmH20] 5 cmH20 Plateau Pressure:  [21 FHL45-62 cmH20] 23 cmH20  INTAKE / OUTPUT: I/O last 3 completed shifts: In: 4629.8 [I.V.:1019.8; NG/GT:3360; IV Piggyback:250] Out: 5638 [Urine:4730; Stool:300]  PHYSICAL EXAMINATION: General: awake, fc, strong ext  Neuro: nonfocal, good strength, perrl HEENT: obese, beard PULM: clearing scattered ronchi CV: s1 s2 RRR no  GI: soft, BS wnl, no r Extremities: edema    LABS:  BMET  Recent Labs Lab 12/12/16 0559 12/13/16 0534 12/14/16 0501  NA 157* 153* 150*  K 3.3* 3.4* 3.7  CL 122* 118* 115*  CO2 '26 25 26  '$ BUN 24* 20 23*  CREATININE 1.13 1.00 0.82  GLUCOSE 194* 174* 140*   Electrolytes  Recent Labs Lab 12/12/16 0559 12/13/16 0534 12/14/16 0501  CALCIUM 9.2 9.0 9.2  MG 2.4 2.2 2.4  PHOS 3.3 3.0 3.5   CBC  Recent Labs Lab 12/12/16 0559 12/13/16 0534  12/14/16 0501  WBC 15.5* 14.4* 11.3*  HGB 10.4* 10.3* 9.2*  HCT 34.9* 35.1* 32.4*  PLT 411* 391 307   Coag's No results for input(s): APTT, INR in the last 168 hours. Sepsis Markers  Recent Labs Lab 12/09/16 0918 12/10/16 0500 12/11/16 0500  PROCALCITON 0.45 0.58 0.41   ABG  Recent Labs Lab 12/11/16 0400 12/12/16 0410 12/13/16 0351  PHART 7.365 7.423 7.396  PCO2ART 49.8* 40.9 43.6  PO2ART 128* 81.2* 87.5   Liver Enzymes No results for input(s): AST, ALT, ALKPHOS, BILITOT, ALBUMIN in the last 168 hours.  Cardiac Enzymes No results for input(s): TROPONINI, PROBNP in the last 168 hours.  Glucose  Recent Labs Lab 12/14/16 1139 12/14/16 1549 12/14/16 1957 12/14/16 2338 12/15/16 0321 12/15/16 0800  GLUCAP 102* 126* 97 111* 133* 98   Imaging CXR 5/15 >> interval worsening of left lung opacity, edema vs opacity/LLL  STUDIES: CTA head 5/7 >> diffuse SAH over both convexities and extending into the basal cisterns. CXR 5/10 >> Cardiomegaly with increasing bilateral airspace opacities which may reflect edema/atelectasis  ECHO 5/10 >> EF of 65-70% and LVH, poor image to determine diastolic dysfunction Transcranial dopplers 5/11 >> poor study LE Doppler 5/15 >> prelim results with concern for probable acute DVT in left popliteal vein, left posterior tibial veins   CULTURES: MRSA 5/8 > Negative  Blood 5/11 >> 1 of 2 GPC >> Coag-negative staph Tracheal aspirate 5/15 >> Moraxella  BC 5/15 >> NTD  ANTIBIOTICS: Vancomycin 1 given 5/12 Cefepime 5/15 (? New L infiltrate) >> 5/17  SIGNIFICANT EVENTS: 5/07  Admit with sudden headache, SAH. 5/08  PCCM consult > intubation >clipping of posterior communicating artery aneurysm  5/12  Vanc x1 for 1/2 coag neg staph, lasix gtt decreased, desats with WUA 5/13  Lasix gtt discontinued 5/14  Fever overnight to 102.8, pink frothy sputum late evening, difficulty oxygenating  5/15  LE duplex, concern for possible popliteal &  tibial DVT   LINES/TUBES: ETT 5/8 >> Right upper extremity PICC 5/9 >>   DISCUSSION: 36 y.o.maleadmitted 5/7 with SAH. Required intubation 5/8 due to hypoxemia and increased WOB.  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, s/p clipping  P:   RASS goal: 0  Continue Nimodipine WUA fent Fentanyl gtt to goal off post op PT as able TCD's per NSGY today Consider MAP goals 75 if TCD neg today and good clinical status ( failed aline attempt again but we are almost at 2 weeks post craniotomy , so vasospasm risk hope to reduce now--> hence MAP goals different from sys 200 goals when cuff unknown accuracy) Also would not have sys 200 with trach planned May start to tolerate neg balance , wont chase output this far out from crani, see renal  PULMONARY A: Acute Hypoxic Respiratory Failure + Pulmonary Edema- improved New L Airspace Disease - new 5/15, edema OSA/OHS P:   Wean again PS 5 goal For trach, then trach collar is goal  CARDIOVASCULAR A:  HTN DVT P:  ICU monitoring of hemodynamics Monitor I/O"s Nimodipine  Levophed for BP target - map 85, likely to lower after TCD IVC filter placed by IR 5/17   RENAL A:   Acute Kidney Injury (Base Crt 0.89) - note received single dose vancomycin 5/12, improved Hypernatremia - mild and improving Volume Overload improving Neg balance output almost 4 liters 5/21 P:   Am chem now and in am  Urine na, osm Allow neg balance for now, not gonna increase volume with good clinical status and day 13 s/p crani   GASTROINTESTINAL A:   Morbid Obesity  Nutrition  GI Prophylaxis  Loose stools, cdiff neg, responding to immoddium  P:   PPI for SUP  TF per nutrition- hold for OR immodium  HEMATOLOGIC A:   VTE Prophylaxis  Possible LLE DVT - see preliminary report  P:  IVC filter in place 5/17 by IR SCD's for DVT prophylaxis consider sub q heparin post op, would d/w  nS  INFECTIOUS A:   Leukocytosis  Fever - 5/10, 5/15 Coag negative staph in one of 2 blood cultures, presumed skin contaminant P:   Monitor temps Allow abx to dc  ENDOCRINE A:   Hyperglycemia   P:   Follow glucose on BMP q4h     FAMILY  - Updates:  I updated pt and mom  - Inter-disciplinary family meet or Palliative Care meeting due by: 5/15   Ccm time 30 min   Lavon Paganini. Titus Mould, MD, [place] Pgr: [facility]

## 2016-12-15 NOTE — Progress Notes (Signed)
RT assisted with pt transported to OR on vent. Vitals remained stable throughout.

## 2016-12-15 NOTE — Progress Notes (Signed)
Patients cortrak noted to be at 78cm at the nare on assessment. Documented that it is supposed to be at 89cm. Held tube feed and advanced cortrak to 87cm.  ABD xray confirmed cortrak is within the stomach. Tube secured with bridle and clip.

## 2016-12-15 NOTE — Anesthesia Postprocedure Evaluation (Addendum)
Anesthesia Post Note  Patient: Bryan [name]  Procedure(s) Performed: Procedure(s) (LRB): TRACHEOSTOMY (N/A)  Patient location during evaluation: NICU Anesthesia Type: General Level of consciousness: patient remains intubated per anesthesia plan and sedated Vital Signs Assessment: post-procedure vital signs reviewed and stable Respiratory status: patient on ventilator - see flowsheet for VS and patient remains intubated per anesthesia plan Cardiovascular status: blood pressure returned to baseline Anesthetic complications: no       Last Vitals:  Vitals:   12/15/16 1900 12/15/16 1924  BP: 113/60 124/66  Pulse: 86 90  Resp: (!) 29 18  Temp:      Last Pain:  Vitals:   12/15/16 0200  TempSrc: Rectal                 Bryan [name]

## 2016-12-15 NOTE — Progress Notes (Signed)
Transcranial Doppler  Date POD PCO2 HCT BP  MCA ACA PCA OPHT SIPH VERT Basilar  5/9 RS     Right Left * * * * * * 22 29 39 * -15 * *  5/11 JE     Right  Left   31  29   *  *   *  *   34  *   *  *   -27     *      5/14 JE     Right  Left   27  32   *  -25   *  *   44  48   50  64   *  *   *      5/16 JE     Right  Left   27  35   -25  -22   *  *   31  43   55  71   -46  *   *       5/18 JE     Right  Left   30  87   *  -29   *  *   37  48   48  59   *  -35   *       5/21 JE     Right  Left   23  23   *  *   *  *   33  44   *  54   *  *   *           Right  Left                                            Right  Left                                        MCA = Middle Cerebral Artery      OPHT = Opthalmic Artery     BASILAR = Basilar Artery   ACA = Anterior Cerebral Artery     SIPH = Carotid Siphon PCA = Posterior Cerebral Artery   VERT = Verterbral Artery                   Normal MCA = 62+\-12 ACA = 50+\-12 PCA = 42+\-23   First TCD attempt 12/01/16 with no velocities obtained due to poor windows. RS  5/9 Very limited windows. RS  5/11 Very limited test due to body habitus and facial edema. Poor windows. Very few vessels insonated. Bilateral MCA high resistant-- unsure if this is correct vessel. JE  5/14 Very limited windows. JE  5/16 Limited study due to poor windows and patient movement. JE  5/17 Limited study due to poor windows and patient movement. JE  5/21 very limited study due to poor windows, body habitus, and patient movement. Unable to determine if MCA waveforms are artifact. JE   12/15/2016

## 2016-12-15 NOTE — Anesthesia Preprocedure Evaluation (Signed)
Anesthesia Evaluation  Patient identified by MRN, date of birth, ID band Patient unresponsive    Reviewed: Allergy & Precautions, NPO status , Patient's Chart, lab work & pertinent test results, Unable to perform ROS - Chart review only  Airway Mallampati: Intubated       Dental  (+) Teeth Intact   Pulmonary     + decreased breath sounds      Cardiovascular  Rhythm:Regular Rate:Normal     Neuro/Psych    GI/Hepatic   Endo/Other    Renal/GU      Musculoskeletal   Abdominal (+) + obese,   Peds  Hematology   Anesthesia Other Findings   Reproductive/Obstetrics                             Anesthesia Physical Anesthesia Plan  ASA: III  Anesthesia Plan: General   Post-op Pain Management:    Induction: Intravenous  Airway Management Planned: Oral ETT  Additional Equipment:   Intra-op Plan:   Post-operative Plan: Post-operative intubation/ventilation  Informed Consent: I have reviewed the patients History and Physical, chart, labs and discussed the procedure including the risks, benefits and alternatives for the proposed anesthesia with the patient or authorized representative who has indicated his/her understanding and acceptance.     Plan Discussed with: CRNA and Anesthesiologist  Anesthesia Plan Comments:         Anesthesia Quick Evaluation

## 2016-12-15 NOTE — Progress Notes (Signed)
No issues overnight.   EXAM:  BP (!) 145/72   Pulse 81   Temp 99.1 F (37.3 C) (Rectal)   Resp 15   Ht 5\' 11"  (1.803 m)   Wt (!) 195 kg (430 lb)   SpO2 100%   BMI 59.97 kg/m   Awake, alert,   CN grossly intact  Moves all extremities to command, at least antigravity throughout. Minimal asymmetry to exam. Wound c/d/i  IMPRESSION:  36 y.o. male SAH d# 14 s/p left Pcom clipping. Has responded well to hyperdynamic therapy. VDRF  PLAN: - OR for trach today - Plan on TCD today. If relatively normal, will likely stop TCDs and can start to back off pressors.

## 2016-12-16 ENCOUNTER — Encounter (HOSPITAL_COMMUNITY): Payer: Self-pay | Admitting: Otolaryngology

## 2016-12-16 ENCOUNTER — Inpatient Hospital Stay (HOSPITAL_COMMUNITY): Payer: BLUE CROSS/BLUE SHIELD

## 2016-12-16 LAB — BASIC METABOLIC PANEL
Anion gap: 7 (ref 5–15)
BUN: 22 mg/dL — ABNORMAL HIGH (ref 6–20)
CALCIUM: 8.7 mg/dL — AB (ref 8.9–10.3)
CHLORIDE: 113 mmol/L — AB (ref 101–111)
CO2: 27 mmol/L (ref 22–32)
CREATININE: 0.89 mg/dL (ref 0.61–1.24)
GFR calc Af Amer: >60 mL/min (ref >60)
GFR calc non Af Amer: >60 mL/min (ref >60)
Glucose, Bld: 123 mg/dL — ABNORMAL HIGH (ref 65–99)
Potassium: 3.7 mmol/L (ref 3.5–5.1)
Sodium: 147 mmol/L — ABNORMAL HIGH (ref 135–145)

## 2016-12-16 LAB — GLUCOSE, CAPILLARY
GLUCOSE-CAPILLARY: 84 mg/dL (ref 65–99)
Glucose-Capillary: 104 mg/dL — ABNORMAL HIGH (ref 65–99)
Glucose-Capillary: 115 mg/dL — ABNORMAL HIGH (ref 65–99)
Glucose-Capillary: 135 mg/dL — ABNORMAL HIGH (ref 65–99)
Glucose-Capillary: 96 mg/dL (ref 65–99)

## 2016-12-16 LAB — CBC
HEMATOCRIT: 30.8 % — AB (ref 39.0–52.0)
Hemoglobin: 9 g/dL — ABNORMAL LOW (ref 13.0–17.0)
MCH: 26.6 pg (ref 26.0–34.0)
MCHC: 29.2 g/dL — ABNORMAL LOW (ref 30.0–36.0)
MCV: 91.1 fL (ref 78.0–100.0)
Platelets: 304 10*3/uL (ref 150–400)
RBC: 3.38 MIL/uL — ABNORMAL LOW (ref 4.22–5.81)
RDW: 15.2 % (ref 11.5–15.5)
WBC: 8.1 10*3/uL (ref 4.0–10.5)

## 2016-12-16 LAB — BLOOD GAS, ARTERIAL
Acid-Base Excess: 2.1 mmol/L — ABNORMAL HIGH (ref 0.0–2.0)
Bicarbonate: 26.8 mmol/L (ref 20.0–28.0)
Drawn by: 441371
FIO2: 28
O2 Saturation: 96.3 %
PATIENT TEMPERATURE: 98.6
PCO2 ART: 46.5 mmHg (ref 32.0–48.0)
PO2 ART: 90.5 mmHg (ref 83.0–108.0)
pH, Arterial: 7.379 (ref 7.350–7.450)

## 2016-12-16 LAB — MAGNESIUM: Magnesium: 2.4 mg/dL (ref 1.7–2.4)

## 2016-12-16 LAB — PHOSPHORUS: Phosphorus: 3.2 mg/dL (ref 2.5–4.6)

## 2016-12-16 MED ORDER — FENTANYL CITRATE (PF) 100 MCG/2ML IJ SOLN
100.0000 ug | INTRAMUSCULAR | Status: DC | PRN
  Administered 2016-12-16: 100 ug via INTRAVENOUS

## 2016-12-16 MED ORDER — FENTANYL CITRATE (PF) 100 MCG/2ML IJ SOLN: 100.0000 ug | INTRAMUSCULAR | Status: DC | PRN

## 2016-12-16 NOTE — Progress Notes (Signed)
Nutrition Follow-up  DOCUMENTATION CODES:   Morbid obesity  INTERVENTION:   Continue: Vital High Protein @ 50 ml/hr (1200 ml/day) 60 ml Prostat TID MVI daily 250 ml free water every 6 hours  Provides: 1800 kcal, 195 grams protein, and 1003 ml free water  Total free water: 2003 ml   NUTRITION DIAGNOSIS:   Inadequate oral intake related to inability to eat as evidenced by NPO status. Ongoing.   GOAL:   Provide needs based on ASPEN/SCCM guidelines Met.   MONITOR:   TF tolerance, Vent status  ASSESSMENT:   Pt with PMH of morbid obesity and varicose veins admitted with sudden HA. CTA demonstrated diffuse SAH over both convexities and extending into the basal cisterns. 5/8 pt with worsening hypoxemia and increased WOB and intubated.   5/21 trach placed, remains on TC x 24 hours, per MD may need nocturnal vent support but did not need it last night.  5/22 failed swallow eval, possible need for PEG.  Cortrak replaced 5/21 CIR recommended.   Diet Order:    NPO  Skin:  Reviewed, no issues  Last BM:  5/21  Height:   Ht Readings from Last 1 Encounters:  12/02/16 5' 11" (1.803 m)    Weight:   Wt Readings from Last 1 Encounters:  12/16/16 (!) 440 lb (199.6 kg)    Ideal Body Weight:  78.1 kg  BMI:  Body mass index is 61.37 kg/m.  Estimated Nutritional Needs:   Kcal:  0903-0149  Protein:  195 grams  Fluid:  >1.7 L/day  EDUCATION NEEDS:   No education needs identified at this time  [place], [place], [place] Pager [phone] After Hours Pager

## 2016-12-16 NOTE — Progress Notes (Signed)
Inpatient Rehabilitation  Pt. evaluated by all therapy disciplines and they are recommending IP rehab eventually for pt.  Please order consult if you are agreeable and when you feel appropriate.  I left a voice mail for Sidney AceJulie Amerson, RNCM.  Please call if questions.  Weldon PickingSusan Mckena Chern PT Inpatient Rehab Admissions Coordinator Cell [phone] Office [phone]

## 2016-12-16 NOTE — Evaluation (Signed)
Occupational Therapy Evaluation Patient Details Name: Bryan [name] MRN: [id number] DOB: Sep 07, 1980 Today's Date: 12/16/2016    History of Present Illness Patient is a 36 y/o male admitted 12/01/16 with sudden onset of headache. CTA-diffuse SAH over both convexities and extending into the basal cisterns. 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   Clinical Impression   Pt admitted with above. He demonstrates the below listed deficits and will benefit from continued OT to maximize safety and independence with BADLs.  Pt presents to OT with flat affect, slow processing/initiation - unsure if this due to cognition, or possibly a communication deficit (no family present to provide info re: pt baseline).  He demonstrates generalized weakness, decreased trunk control and sitting balance.  He requires max - total A for ADLs and total A =2 for bed mobility.   He was, per chart, independent PTA.  Recommend CIR consult      Follow Up Recommendations  CIR    Equipment Recommendations  3 in 1 bedside commode    Recommendations for Other Services Rehab consult     Precautions / Restrictions Precautions Precautions: Fall Precaution Comments: MAP goal 70; trach  Restrictions Weight Bearing Restrictions: No      Mobility Bed Mobility Overal bed mobility: Needs Assistance Bed Mobility: Supine to Sit;Sit to Supine     Supine to sit: Total assist;+2 for physical assistance;+2 for safety/equipment;HOB elevated Sit to supine: Total assist;+2 for physical assistance;+2 for safety/equipment;HOB elevated   General bed mobility comments: Assist to bring LEs to EOB and elevate trunk; assist of 2 to return to supine. Pt with posterior lean and BLEs into extension  requiring support.  Transfers                 General transfer comment: unable to attempt     Balance Overall balance assessment: Needs assistance Sitting-balance support: Feet supported;Single extremity supported Sitting balance-Leahy Scale: Poor Sitting balance - Comments: Able to sit EOB ~15 mins. Able to initiate trunk and anterior translation on occasion but fatigues quickly requiring total A-Min guard assist for moments; BLEs kept falling out into extension requiring support to maintain position. Postural control: Posterior lean                                 ADL either performed or assessed with clinical judgement   ADL Overall ADL's : Needs assistance/impaired Eating/Feeding: NPO   Grooming: Wash/dry hands;Wash/dry face;Oral care;Brushing hair;Moderate assistance;Bed level   Upper Body Bathing: Maximal assistance;Bed level   Lower Body Bathing: Total assistance;+2 for physical assistance;Bed level   Upper Body Dressing : Total assistance;Bed level   Lower Body Dressing: Total assistance;+2 for physical assistance;Bed level   Toilet Transfer: Total assistance Toilet Transfer Details (indicate cue type and reason): unable to attempt  Toileting- Clothing Manipulation and Hygiene: Total assistance;+2 for physical assistance;Bed level       Functional mobility during ADLs: Total assistance;+2 for physical assistance General ADL Comments: pt slow to respond, and slow to initiate activiity      Vision   Additional Comments: Pt unable to participate fully in visual assessment      Perception     Praxis Praxis Praxis-Other Comments: difficult to accurately assess due to  trach and pt's inability to communicate    Pertinent Vitals/Pain Pain Assessment: Faces Faces Pain Scale: No hurt     Hand Dominance Right   Extremity/Trunk Assessment Upper Extremity Assessment Upper Extremity Assessment: Generalized weakness   Lower Extremity  Assessment Lower Extremity Assessment: Defer to PT evaluation   Cervical / Trunk Assessment Cervical / Trunk Assessment: Other exceptions Cervical / Trunk Exceptions: morbidly obese    Communication Communication Communication: Tracheostomy   Cognition Arousal/Alertness: Awake/alert Behavior During Therapy: Flat affect Overall Cognitive Status: Difficult to assess Area of Impairment: Attention;Following commands;Problem solving                   Current Attention Level: Sustained   Following Commands: Follows one step commands with increased time;Follows one step commands inconsistently     Problem Solving: Slow processing;Difficulty sequencing;Decreased initiation;Requires verbal cues General Comments: Pt nodding appropriately.  Is very slow to follow commands at times, and at other times not.  He does not interact much with therapists even when cued.  Unsure of his baseline, or if communication deficits may be present    General Comments  VSS - pt on trach collar on 28% FI02    Exercises     Shoulder Instructions      Home Living Family/patient expects to be discharged to:: Private residence Living Arrangements: Parent Available Help at Discharge: Family   Home Access: Stairs to enter Entrance Stairs-Number of Steps: could not specify                              Prior Functioning/Environment Level of Independence: Independent        Comments: Reports working PTA but difficult to get history due to trach.        OT Problem List: Decreased strength;Decreased activity tolerance;Impaired balance (sitting and/or standing);Decreased cognition;Decreased safety awareness;Decreased knowledge of use of DME or AE;Cardiopulmonary status limiting activity;Obesity      OT Treatment/Interventions: Self-care/ADL training;Therapeutic exercise;Neuromuscular education;DME and/or AE instruction;Therapeutic activities;Cognitive  remediation/compensation;Patient/family education;Visual/perceptual remediation/compensation;Balance training    OT Goals(Current goals can be found in the care plan section) Acute Rehab OT Goals Patient Stated Goal: none stated OT Goal Formulation: With patient Time For Goal Achievement: 12/30/16 Potential to Achieve Goals: Good ADL Goals Pt Will Perform Grooming: with min assist;sitting Pt Will Perform Upper Body Bathing: with mod assist;sitting Pt Will Transfer to Toilet: with mod assist;with +2 assist;stand pivot transfer;bedside commode Pt/caregiver will Perform Home Exercise Program: Increased strength;Right Upper extremity;With Supervision Additional ADL Goal #1: Pt will maintain EOB sitting x 10 mins with min guard assist while performing simple grooming tasks   OT Frequency: Min 2X/week   Barriers to D/C: Decreased caregiver support  unsure if family able to provide necessary level of assist        Co-evaluation PT/OT/SLP Co-Evaluation/Treatment: Yes Reason for Co-Treatment: For patient/therapist safety;To address functional/ADL transfers PT goals addressed during session: Mobility/safety with mobility OT goals addressed during session: ADL's and self-care      AM-PAC PT "6 Clicks" Daily Activity     Outcome Measure Help from another person eating meals?: Total Help from another person taking care of personal grooming?: A Lot Help from another person toileting, which includes using toliet, bedpan, or urinal?: Total Help from another person bathing (including washing, rinsing, drying)?: Total Help from another person to put on and taking off regular upper body clothing?: Total Help from another person to put on and taking  off regular lower body clothing?: Total 6 Click Score: 7   End of Session Equipment Utilized During Treatment: Oxygen Nurse Communication: Mobility status  Activity Tolerance: Patient limited by fatigue Patient left: in bed;with call bell/phone  within reach  OT Visit Diagnosis: Muscle weakness (generalized) (M62.81);Cognitive communication deficit (R41.841) Symptoms and signs involving cognitive functions: Nontraumatic SAH                Time: 4098-1191 OT Time Calculation (min): 38 min Charges:  OT General Charges $OT Visit: 1 Procedure OT Evaluation $OT Eval Moderate Complexity: 1 Procedure G-Codes:     Jeani Hawking, OTR/L [phone]   Virgina Organ, Jene Oravec M 12/16/2016, 2:23 PM

## 2016-12-16 NOTE — Evaluation (Signed)
Physical Therapy Evaluation Patient Details Name: Bryan KaysDarren D Salva MRN: [id number] DOB: 10/23/1980 Today's Date: 12/16/2016   History of Present Illness  Patient is a 36 y/o male admitted 12/01/16 with sudden onset of headache. CTA-diffuse SAH over both convexities and extending into the basal cisterns. 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  Clinical Impression  Patient presents with generalized weakness, deconditioning, and impaired mobility s/p above. Difficult to assess cognition due to trach. Total A to get to EOB and to return to supine with decreased initiation noted. Pt able to initiate trunk and sit EOB for moments at a time but fatigues easily. VSS throughout. Not sure of pt's PLOF as pt not able to communicate at this time. Nods yes to working and being independent PTA. Would benefit from intensive rehabs to maximize independence and mobility prior to return home with family. Will follow acutely.    Follow Up Recommendations CIR    Equipment Recommendations  Other (comment) (TBA)    Recommendations for Other Services Rehab consult     Precautions / Restrictions Precautions Precautions: Fall Precaution Comments: MAP goal 70; trach  Restrictions Weight Bearing Restrictions: No      Mobility  Bed Mobility Overal bed mobility: Needs Assistance Bed Mobility: Supine to Sit;Sit to Supine     Supine to sit: Total assist;+2 for physical assistance;+2 for safety/equipment;HOB elevated Sit to supine: Total assist;+2 for physical assistance;+2 for safety/equipment;HOB elevated   General bed mobility comments: Assist to bring LEs to EOB and elevate trunk; assist of 2 to return to supine. Pt with posterior lean and BLEs into extension requiring  support.  Transfers                    Ambulation/Gait                Stairs            Wheelchair Mobility    Modified Rankin (Stroke Patients Only) Modified Rankin (Stroke Patients Only) Pre-Morbid Rankin Score: No symptoms Modified Rankin: Severe disability     Balance Overall balance assessment: Needs assistance Sitting-balance support: Feet supported;Single extremity supported Sitting balance-Leahy Scale: Poor Sitting balance - Comments: Able to sit EOB ~15 mins. Able to initiate trunk and anterior translation on occasion but fatigues quickly requiring total A-Min guard assist for moments; BLEs kept falling out into extension requiring support to maintain position. Postural control: Posterior lean                                   Pertinent Vitals/Pain Pain Assessment: Faces Faces Pain Scale: No hurt    Home Living Family/patient expects to be discharged to:: Private residence Living Arrangements: Parent Available Help at Discharge: Family   Home Access: Stairs to enter   Entrance Stairs-Number of Steps: could not specify          Prior Function Level of Independence: Independent         Comments: Reports working PTA but difficult to get history due to trach.     Hand Dominance        Extremity/Trunk Assessment   Upper Extremity Assessment Upper Extremity Assessment: Defer to OT evaluation    Lower Extremity Assessment Lower Extremity  Assessment: Generalized weakness       Communication   Communication: Tracheostomy  Cognition Arousal/Alertness: Awake/alert Behavior During Therapy: Flat affect Overall Cognitive Status: Difficult to assess                                 General Comments: Pt nodding appropriately at times but then would stop communicating or nodding to questions; question cognitive impairments? not sure of baseline?      General Comments General comments (skin integrity,  edema, etc.): VSS.    Exercises     Assessment/Plan    PT Assessment Patient needs continued PT services  PT Problem List Decreased strength;Decreased mobility;Obesity;Decreased range of motion;Decreased activity tolerance;Cardiopulmonary status limiting activity;Decreased cognition;Decreased balance       PT Treatment Interventions Therapeutic activities;Therapeutic exercise;Gait training;Patient/family education;Cognitive remediation;Balance training;Wheelchair mobility training;Functional mobility training;Stair training;Neuromuscular re-education    PT Goals (Current goals can be found in the Care Plan section)  Acute Rehab PT Goals Patient Stated Goal: none stated PT Goal Formulation: Patient unable to participate in goal setting Time For Goal Achievement: 12/30/16 Potential to Achieve Goals: Fair    Frequency Min 4X/week   Barriers to discharge   not sure of level of support at home    Co-evaluation PT/OT/SLP Co-Evaluation/Treatment: Yes Reason for Co-Treatment: To address functional/ADL transfers;For patient/therapist safety;Complexity of the patient's impairments (multi-system involvement) PT goals addressed during session: Mobility/safety with mobility         AM-PAC PT "6 Clicks" Daily Activity  Outcome Measure Difficulty turning over in bed (including adjusting bedclothes, sheets and blankets)?: Total Difficulty moving from lying on back to sitting on the side of the bed? : Total Difficulty sitting down on and standing up from a chair with arms (e.g., wheelchair, bedside commode, etc,.)?: Total Help needed moving to and from a bed to chair (including a wheelchair)?: Total Help needed walking in hospital room?: Total Help needed climbing 3-5 steps with a railing? : Total 6 Click Score: 6    End of Session Equipment Utilized During Treatment: Oxygen (trach collar) Activity Tolerance: Patient limited by fatigue Patient left: in bed;with call bell/phone within  reach Nurse Communication: Mobility status;Need for lift equipment PT Visit Diagnosis: Muscle weakness (generalized) (M62.81);Other symptoms and signs involving the nervous system (R29.898)    Time: 1610-9604 PT Time Calculation (min) (ACUTE ONLY): 40 min   Charges:   PT Evaluation $PT Eval Moderate Complexity: 1 Procedure PT Treatments $Therapeutic Activity: 8-22 mins   PT G Codes:        [name], PT, DPT [phone]    [name] A Meridith Romick 12/16/2016, 1:58 PM

## 2016-12-16 NOTE — Progress Notes (Signed)
PULMONARY / CRITICAL CARE MEDICINE   Name: Bryan [name] MRN: [id number] DOB: 04/23/1981    ADMISSION DATE:  12/01/2016 CONSULTATION DATE:  12/02/2016  REFERRING MD:  Dr. Ralene Ok   CHIEF COMPLAINT:  SAH  BRIEF SUMMARY:  36 y.o.malewith PMH of morbid obesity, varicose veins who presented to [facility] ED 12/01/16 with sudden onset of headache that started earlier that morning. CTA demonstrated diffuse SAH over both convexities and extending into the basal cisterns. Admitted by neurosurgery with plans for angiogram 5/8 with possible clipping.  Subsequently developed worsening hypoxemia and increased WOB requiring intubation.    SUBJECTIVE:   MAP goal met. Levo off Trach placed yesterday Tol ATC Neg 1.6L  VITAL SIGNS: BP 131/67   Pulse 79   Temp 99.1 F (37.3 C) (Rectal)   Resp 17   Ht '5\' 11"'$  (1.803 m)   Wt (!) 199.6 kg (440 lb)   SpO2 100%   BMI 61.37 kg/m   HEMODYNAMICS:    VENTILATOR SETTINGS: Vent Mode: PCV FiO2 (%):  [28 %-80 %] 28 % Set Rate:  [15 bmp] 15 bmp PEEP:  [5 cmH20] 5 cmH20 Plateau Pressure:  [23 cmH20] 23 cmH20  INTAKE / OUTPUT: I/O last 3 completed shifts: In: 3211.1 [I.V.:1334.1; NG/GT:1777; IV Piggyback:100] Out: 6568 [Urine:4840; Emesis/NG output:120; Stool:450; Blood:25]  PHYSICAL EXAMINATION:  General: morbidly obese black male in NAD Neuro: RASS -2. Sleepy HEENT: [place]/AT, PERRL, large neck girth no appreciable JVD PULM: Upper airway sounds obscuring. Unlabored on ATC 28% for almost 24 hours now.  CV: RRR, no MRG GI: Obese, soft, non-tender Extremities: No acute deformity. +2 edema  LABS:  BMET  Recent Labs Lab 12/14/16 0501 12/15/16 0827 12/16/16 0515  NA 150* 147* 147*  K 3.7 3.9 3.7  CL 115* 113* 113*  CO2 '26 27 27  '$ BUN 23* 25* 22*  CREATININE 0.82 0.84 0.89  GLUCOSE 140* 116* 123*   Electrolytes  Recent Labs Lab 12/13/16 0534 12/14/16 0501 12/15/16 0827 12/16/16 0515  CALCIUM 9.0 9.2 9.0 8.7*  MG 2.2 2.4  --  2.4  PHOS 3.0  3.5  --  3.2   CBC  Recent Labs Lab 12/14/16 0501 12/15/16 0827 12/16/16 0515  WBC 11.3* 9.6 8.1  HGB 9.2* 9.2* 9.0*  HCT 32.4* 31.8* 30.8*  PLT 307 312 304   Coag's No results for input(s): APTT, INR in the last 168 hours. Sepsis Markers  Recent Labs Lab 12/09/16 0918 12/10/16 0500 12/11/16 0500  PROCALCITON 0.45 0.58 0.41   ABG  Recent Labs Lab 12/11/16 0400 12/12/16 0410 12/13/16 0351  PHART 7.365 7.423 7.396  PCO2ART 49.8* 40.9 43.6  PO2ART 128* 81.2* 87.5   Liver Enzymes No results for input(s): AST, ALT, ALKPHOS, BILITOT, ALBUMIN in the last 168 hours.  Cardiac Enzymes No results for input(s): TROPONINI, PROBNP in the last 168 hours.  Glucose  Recent Labs Lab 12/15/16 0800 12/15/16 1318 12/15/16 1547 12/15/16 1943 12/16/16 0035 12/16/16 0802  GLUCAP 98 134* 134* 127* 135* 84   Imaging CXR 5/15 >> interval worsening of left lung opacity, edema vs opacity/LLL  STUDIES: CTA head 5/7 >> diffuse SAH over both convexities and extending into the basal cisterns. CXR 5/10 >> Cardiomegaly with increasing bilateral airspace opacities which may reflect edema/atelectasis  ECHO 5/10 >> EF of 65-70% and LVH, poor image to determine diastolic dysfunction Transcranial dopplers 5/11 >> poor study LE Doppler 5/15 >> prelim results with concern for probable acute DVT in left popliteal vein, left posterior tibial veins  CULTURES: MRSA 5/8 > Negative  Blood 5/11 >> 1 of 2 GPC >> Coag-negative staph Tracheal aspirate 5/15 >> Moraxella  BC 5/15 >> NTD  ANTIBIOTICS: Vancomycin 1 given 5/12 Cefepime 5/15 (? New L infiltrate) >> 5/17>>>  SIGNIFICANT EVENTS: 5/07  Admit with sudden headache, SAH. 5/08  PCCM consult > intubation >clipping of posterior communicating artery aneurysm  5/12  Vanc x1 for 1/2 coag neg staph, lasix gtt decreased, desats with WUA 5/13  Lasix gtt discontinued 5/14  Fever overnight to 102.8, pink frothy sputum late evening,  difficulty oxygenating  5/15  LE duplex, concern for possible popliteal & tibial DVT 5/21 Trach placed   LINES/TUBES: ETT 5/8 >>5/21 Right upper extremity PICC 5/9 >>  Trach 5/21 >>  DISCUSSION: 36 y.o.maleadmitted 5/7 with SAH. Required intubation 5/8 due to hypoxemia and increased WOB. Aneurysm clipped 5/8. Course complicated by vasospasm. Trached 5/2.  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, s/p clipping  P:   RASS goal: 0  Continue Nimodipine Off sedation infusion. PRN fent only PT/OT/ST today TCD's per NSGY MAP goals 70-80 now that 15 days out and lower spasm risk.  May start to tolerate neg balance. No plans to diurese as of yet, however  PULMONARY A: Acute Hypoxic Respiratory Failure + Pulmonary Edema- improved New L Airspace Disease - new 5/15, edema OSA/OHS P:   Continue trach collar as indicated.  Has been on TC all night Will assess ABG, somewhat sleepy this AM. He may need nocturnal vent support due to OHS  CARDIOVASCULAR A:  HTN DVT IVC filter placed 5/17 P:  ICU monitoring of hemodynamics Monitor I/O"s Nimodipine  Levophed for BP target - map 70-80. Off levo maintaining this. As above  RENAL A:   Acute Kidney Injury (Base Crt 0.89) - note received single dose vancomycin 5/12, improved Hypernatremia - mild and improving (5/21 urine osm 780, serum 320, Urine Na 120, serum 147) Volume Overload improving Neg balance output almost 4 liters 5/21 P:   Am chem Allow neg balance for now Free water  GASTROINTESTINAL A:   Morbid Obesity  Nutrition  GI Prophylaxis  Loose stools, cdiff neg, responding to immoddium  P:   PPI for SUP  TF per nutrition- hold for OR Issues with NGT, may need PEG. ST will eval today.  Immodium, continue, seems to be working.   HEMATOLOGIC A:   VTE Prophylaxis  Possible LLE DVT - see preliminary report  P:  IVC filter in place 5/17 by IR SCD's  for DVT prophylaxis consider sub q heparin post op, neurosurgery thoughts?  INFECTIOUS A:   Leukocytosis  Fever - 5/10, 5/15 Coag negative staph in one of 2 blood cultures, presumed skin contaminant P:   Monitor temps Off ABX 5/20  ENDOCRINE A:   Hyperglycemia   P:   Follow CBG q4   FAMILY  - Updates:  Patient updated.   - Inter-disciplinary family meet or Palliative Care meeting due by: Ongoing  APP critical care time 30 mins.  Will keep in ICU for now. Nearing readiness for transfer to vent SDU.   Georgann Housekeeper, AGACNP-BC [facility] Pager [phone] or [phone]  12/16/2016 9:25 AM   STAFF NOTE: Linwood Dibbles, MD FACP have personally reviewed patient's available data, including medical history, events of note, physical examination and test results as part of my evaluation. I have discussed with resident/NP and other care providers such as  pharmacist, RN and RRT. In addition, I personally evaluated patient and elicited key findings of: awake, alert, nonfocal, trach clean, lungs clear, abdo soft, obese, int ronchi with suctioning clears, s/p trach , did well and now on TC , goal 24 hours, abg now to ensure no co2 narcosis that would require nocturnal vent support, he is again neg on own but clinically doing well with this approach and TCD not helpful but out of window of vasospasm risk, I would allow this neg balance and shoot for MAP 70 now, may need peg but with his body would think has a chance to eat on own, speech eval for PMV and swallow objective testing, his urine chem are appropriate, update family daily, has a filter, want to d/w NS start of sub q heparin, , likley in next 2 days should go for nomral MAP The patient is critically ill with multiple organ systems failure and requires high complexity decision making for assessment and support, frequent evaluation and titration of therapies, application of advanced monitoring technologies  and extensive interpretation of multiple databases.   Critical Care Time devoted to patient care services described in this note is 30 Minutes. This time reflects time of care of this signee: Merrie Roof, MD FACP. This critical care time does not reflect procedure time, or teaching time or supervisory time of PA/NP/Med student/Med Resident etc but could involve care discussion time. Rest per NP/medical resident whose note is outlined above and that I agree with   Lavon Paganini. Titus Mould, MD, [place] Pgr: [facility] 12/16/2016 10:55 AM

## 2016-12-16 NOTE — Progress Notes (Signed)
Pt seen and examined.  No issues overnight. Tracheostomy yesterday  EXAM: Temp:  [99.1 F (37.3 C)-100.7 F (38.2 C)] 99.1 F (37.3 C) (05/22 0400) Pulse Rate:  [64-123] 88 (05/22 0730) Resp:  [13-34] 18 (05/22 0730) BP: (82-168)/(48-106) 136/71 (05/22 0730) SpO2:  [93 %-100 %] 100 % (05/22 0730) FiO2 (%):  [28 %-80 %] 28 % (05/22 0730) Weight:  [199.6 kg (440 lb)] 199.6 kg (440 lb) (05/22 0500) Intake/Output      05/21 0701 - 05/22 0700 05/22 0701 - 05/23 0700   I.V. (mL/kg) 996.8 (5)    NG/GT 977    IV Piggyback     Total Intake(mL/kg) 1973.8 (9.9)    Urine (mL/kg/hr) 3115 (0.7)    Emesis/NG output 120 (0)    Stool 350 (0.1)    Blood 25 (0)    Total Output 3610     Net -1636.2           Awake and alert CN grossly intact Follows commands throughout Moves all extremities Strength 4+/5 diffusely  Stable Continue current care Dr Conchita ParisNundkumar to review TCD

## 2016-12-16 NOTE — Evaluation (Signed)
Passy-Muir Speaking Valve - Evaluation Patient Details  Name: Bryan [name] MRN: [id number] Date of Birth: [birth year]  Today's Date: 12/16/2016 Time: 1351-1406 SLP Time Calculation (min) (ACUTE ONLY): 15 min  Past Medical History:  Past Medical History:  Diagnosis Date  . Complication of anesthesia    mom states they almost lost him but not sure if he was given to much medication  . Varicose veins    mutilple bleeding episodes, bilateral lower extremities)   Past Surgical History:  Past Surgical History:  Procedure Laterality Date  . CRANIOTOMY Left 12/02/2016   Procedure: CRANIOTOMY FOR CLIPPING OF INTRACRANIAL ANEURYSM;  Surgeon: Lisbeth Renshaw, MD;  Location: [facility] OR;  Service: Neurosurgery;  Laterality: Left;  . IR FLUORO RM 30-60 MIN  12/02/2016  . IR IVC FILTER PLMT / S&I /IMG GUID/MOD SED  12/11/2016  . IR US GUIDE VASC ACCESS RIGHT  12/02/2016  . lipo around heart  at age 36   pt states its bc they could hear his  heart beat  . ORIF ANKLE FRACTURE Left 09/02/2012   Procedure: OPEN REDUCTION INTERNAL FIXATION (ORIF) ANKLE FRACTURE;  Surgeon: Valeria Batman, MD;  Location: [facility] OR;  Service: Orthopedics;  Laterality: Left;  OPEN REDUCTION INTERNAL FIXATION LEFT DISTAL FIBULA FRACTURE WITH REDUCTION FIXATION DISTAL TIBIA/FIBULAR DIASTASIS   . RADIOLOGY WITH ANESTHESIA N/A 12/02/2016   Procedure: RADIOLOGY WITH ANESTHESIA;  Surgeon: Lisbeth Renshaw, MD;  Location: [facility] OR;  Service: Radiology;  Laterality: N/A;  . TRACHEOSTOMY TUBE PLACEMENT N/A 12/15/2016   Procedure: TRACHEOSTOMY;  Surgeon: Serena Colonel, MD;  Location: [facility] OR;  Service: ENT;  Laterality: N/A;   HPI:  Pt is a 36 y.o.maleadmitted 5/7 with SAH secondary to aneurysm, s/p clipping 5/8 and complicated by vasospasm. He became hypoxic requiring intubation 5/8 and trach 5/2. PMH includes mordbid obesity and varicose veins.   Assessment / Plan / Recommendation Clinical Impression  Pt's cuff was deflated without overt signs  of intolerance. PMV was placed for up to 60 seconds at a time before exhalations became more labored, and back pressure was evident upon valve removal. He had audible wetness that seemed to come from his pharynx with PMV in place but he could not produce a strong enough volitional cough to clear any secretions. With Mod cues and extra time for processing/initiation, pt made one attempt at phonation that was significantly hoarse, approaching aphonic. Suspect that edema and/or secretions may be impacting upper airway patency, although it is also likely that trach size and/or body habitus is impeding airflow as well. SLP will continue to follow to assess for increasing signs of patency, otherwise pt may need to wait for his trach to be downsized. SLP Visit Diagnosis: Aphonia (R49.1)    SLP Assessment  Patient needs continued Speech Lanaguage Pathology Services    Follow Up Recommendations  Inpatient Rehab    Frequency and Duration min 3x week  2 weeks    PMSV Trial PMSV was placed for: up to 60 seconds at a time Able to redirect subglottic air through upper airway: Yes Able to Attain Phonation: No Voice Quality: Hoarse;Aphonic Able to Expectorate Secretions: No attempts Level of Secretion Expectoration with PMSV: Not observed (but per RN has been tracheally expectorating today) Respirations During Trial:  (WFL) SpO2 During Trial:  (low to mid 90s) Pulse During Trial:  (80s) Behavior: Alert;Cooperative   Tracheostomy Tube       Vent Dependency  FiO2 (%): 28 %    Cuff Deflation Trial  GO  Tolerated Cuff Deflation: Yes Length of Time for Cuff Deflation Trial: 15 min under SLP supervision, left deflated Behavior: Alert;Cooperative        Maxcine Hamaiewonsky, Srija Southard 12/16/2016, 3:07 PM   Maxcine HamLaura Paiewonsky, M.A. CCC-SLP [phone]

## 2016-12-16 NOTE — Evaluation (Signed)
Clinical/Bedside Swallow Evaluation Patient Details  Name: Bryan [name] MRN: [id number] Date of Birth: 02-14-1981  Today's Date: 12/16/2016 Time: SLP Start Time (ACUTE ONLY): 1351 SLP Stop Time (ACUTE ONLY): 1406 SLP Time Calculation (min) (ACUTE ONLY): 15 min  Past Medical History:  Past Medical History:  Diagnosis Date  . Complication of anesthesia    mom states they almost lost him but not sure if he was given to much medication  . Varicose veins    mutilple bleeding episodes, bilateral lower extremities)   Past Surgical History:  Past Surgical History:  Procedure Laterality Date  . CRANIOTOMY Left 12/02/2016   Procedure: CRANIOTOMY FOR CLIPPING OF INTRACRANIAL ANEURYSM;  Surgeon: Lisbeth Renshaw, MD;  Location: [facility] OR;  Service: Neurosurgery;  Laterality: Left;  . IR FLUORO RM 30-60 MIN  12/02/2016  . IR IVC FILTER PLMT / S&I /IMG GUID/MOD SED  12/11/2016  . IR US GUIDE VASC ACCESS RIGHT  12/02/2016  . lipo around heart  at age 36   pt states its bc they could hear his  heart beat  . ORIF ANKLE FRACTURE Left 09/02/2012   Procedure: OPEN REDUCTION INTERNAL FIXATION (ORIF) ANKLE FRACTURE;  Surgeon: Valeria Batman, MD;  Location: [facility] OR;  Service: Orthopedics;  Laterality: Left;  OPEN REDUCTION INTERNAL FIXATION LEFT DISTAL FIBULA FRACTURE WITH REDUCTION FIXATION DISTAL TIBIA/FIBULAR DIASTASIS   . RADIOLOGY WITH ANESTHESIA N/A 12/02/2016   Procedure: RADIOLOGY WITH ANESTHESIA;  Surgeon: Lisbeth Renshaw, MD;  Location: [facility] OR;  Service: Radiology;  Laterality: N/A;  . TRACHEOSTOMY TUBE PLACEMENT N/A 12/15/2016   Procedure: TRACHEOSTOMY;  Surgeon: Serena Colonel, MD;  Location: [facility] OR;  Service: ENT;  Laterality: N/A;   HPI:  Pt is a 36 y.o.maleadmitted 5/7 with SAH secondary to aneurysm, s/p clipping 5/8 and complicated by vasospasm. He became hypoxic requiring intubation 5/8 and trach 5/2. PMH includes mordbid obesity and varicose veins.   Assessment / Plan / Recommendation Clinical  Impression  Pt's face and lips appear symmetrical although with reduced oral opening to command. He does smile symmetrically but with a delay in command following, possibly due to reduced initiation or processing speed. He needed Min cues for oral opening and acceptance with his initial ice chip presented, but with subsequent trials he showed improved automaticity. PMV was in place for each swallow but had to be removed in between boluses due to reduced tolerance. His volitional cough is very weak despite cueing. Would maintain NPO status for now with focus on oral care given high risk for aspiration s/p prolonged intubation, weak cough, and suspect reduced glottal closure. Will f/u for readiness to participate in FEES, hopefully when he can better tolerate PMV. SLP Visit Diagnosis: Dysphagia, unspecified (R13.10)    Aspiration Risk  Moderate aspiration risk;Severe aspiration risk    Diet Recommendation NPO   Medication Administration: Via alternative means    Other  Recommendations Oral Care Recommendations: Oral care QID Other Recommendations: Have oral suction available   Follow up Recommendations Inpatient Rehab      Frequency and Duration min 3x week  2 weeks       Prognosis Prognosis for Safe Diet Advancement: Good Barriers to Reach Goals: Cognitive deficits      Swallow Study   General HPI: Pt is a 36 y.o.maleadmitted 5/7 with SAH secondary to aneurysm, s/p clipping 5/8 and complicated by vasospasm. He became hypoxic requiring intubation 5/8 and trach 5/2. PMH includes mordbid obesity and varicose veins. Type of Study: Bedside Swallow Evaluation Previous  Swallow Assessment: none in chart Diet Prior to this Study: NPO;NG Tube Temperature Spikes Noted: Yes (100.7) Respiratory Status: Trach;Trach Collar Trach Size and Type: Cuff;#8;Deflated;With PMSV in place History of Recent Intubation: Yes Length of Intubations (days): 14 days Date extubated:  12/15/16 Behavior/Cognition: Alert;Cooperative;Requires cueing;Other (Comment) (initiation seems delayed) Oral Cavity Assessment: Other (comment) (limited oral opening for assessment) Oral Cavity - Dentition: Adequate natural dentition Self-Feeding Abilities: Total assist Patient Positioning:  (as upright as bed allows with reverse trendelenberg) Baseline Vocal Quality: Hoarse;Aphonic Volitional Cough: Weak;Wet    Oral/Motor/Sensory Function Overall Oral Motor/Sensory Function: Other (comment) (appears symmetrical but with reduced opening)   Ice Chips Ice chips: Impaired Presentation: Spoon Pharyngeal Phase Impairments: Wet Vocal Quality   Thin Liquid Thin Liquid: Not tested    Nectar Thick Nectar Thick Liquid: Not tested   Honey Thick Honey Thick Liquid: Not tested   Puree Puree: Not tested   Solid   GO   Solid: Not tested        Maxcine Hamaiewonsky, Jakoby Melendrez 12/16/2016,3:20 PM  Maxcine HamLaura Paiewonsky, M.A. CCC-SLP [phone]

## 2016-12-17 ENCOUNTER — Other Ambulatory Visit (HOSPITAL_COMMUNITY): Payer: BLUE CROSS/BLUE SHIELD

## 2016-12-17 LAB — CBC
HCT: 31.1 % — ABNORMAL LOW (ref 39.0–52.0)
Hemoglobin: 9 g/dL — ABNORMAL LOW (ref 13.0–17.0)
MCH: 26.4 pg (ref 26.0–34.0)
MCHC: 28.9 g/dL — ABNORMAL LOW (ref 30.0–36.0)
MCV: 91.2 fL (ref 78.0–100.0)
PLATELETS: 319 10*3/uL (ref 150–400)
RBC: 3.41 MIL/uL — AB (ref 4.22–5.81)
RDW: 15.6 % — AB (ref 11.5–15.5)
WBC: 8.7 10*3/uL (ref 4.0–10.5)

## 2016-12-17 LAB — GLUCOSE, CAPILLARY
GLUCOSE-CAPILLARY: 85 mg/dL (ref 65–99)
GLUCOSE-CAPILLARY: 92 mg/dL (ref 65–99)
Glucose-Capillary: 91 mg/dL (ref 65–99)
Glucose-Capillary: 100 mg/dL — ABNORMAL HIGH (ref 65–99)
Glucose-Capillary: 97 mg/dL (ref 65–99)
Glucose-Capillary: 90 mg/dL (ref 65–99)
Glucose-Capillary: 87 mg/dL (ref 65–99)

## 2016-12-17 LAB — BASIC METABOLIC PANEL
ANION GAP: 7 (ref 5–15)
BUN: 26 mg/dL — AB (ref 6–20)
CALCIUM: 9.1 mg/dL (ref 8.9–10.3)
CO2: 28 mmol/L (ref 22–32)
Chloride: 111 mmol/L (ref 101–111)
Creatinine, Ser: 0.85 mg/dL (ref 0.61–1.24)
GFR calc Af Amer: >60 mL/min (ref >60)
Glucose, Bld: 110 mg/dL — ABNORMAL HIGH (ref 65–99)
POTASSIUM: 3.8 mmol/L (ref 3.5–5.1)
SODIUM: 146 mmol/L — AB (ref 135–145)

## 2016-12-17 MED ORDER — ALTEPLASE 2 MG IJ SOLR
2.0000 mg | Freq: Once | INTRAMUSCULAR | Status: AC
  Administered 2016-12-17: 2 mg

## 2016-12-17 NOTE — Progress Notes (Signed)
  Speech Language Pathology Treatment: Dysphagia;Passy Muir Speaking valve  Patient Details Name: Bryan [name] MRN: [id number] DOB: 1981-02-22 Today's Date: 12/17/2016 Time: [id number] SLP Time Calculation (min) (ACUTE ONLY): 27 min  Assessment / Plan / Recommendation Clinical Impression  Pt still only wears the PMV for up to about 1 minute or so at a time, but he was able to wear it for more trials today than he was yesterday. Placement elicits increased wet-sounding cough with mild secretions orally suctioned. With Mod cues and encouragement he attempted phonation, which was very soft and hoarse. Glottal closure was better identified with reflexive coughing. He consumed only a few ice chips while PMV was in place due to concern for reduced secretion management. Will continue to follow.   HPI HPI: Pt is a 36 y.o.maleadmitted 5/7 with SAH secondary to aneurysm, s/p clipping 5/8 and complicated by vasospasm. He became hypoxic requiring intubation 5/8 and trach 5/2. PMH includes mordbid obesity and varicose veins.      SLP Plan  Continue with current plan of care       Recommendations  Diet recommendations: NPO Medication Administration: Via alternative means      Patient may use Passy-Muir Speech Valve: with SLP only PMSV Supervision: Full MD: Please consider changing trach tube to : Smaller size;Cuffless         Oral Care Recommendations: Oral care QID Follow up Recommendations: Inpatient Rehab SLP Visit Diagnosis: Dysphagia, unspecified (R13.10);Aphonia (R49.1) Plan: Continue with current plan of care       GO                Maxcine Hamaiewonsky, Ahmari Garton 12/17/2016, 11:46 AM  Maxcine HamLaura Paiewonsky, M.A. CCC-SLP [phone]

## 2016-12-17 NOTE — Evaluation (Signed)
Speech Language Pathology Evaluation Patient Details Name: Bryan [name] MRN: [id number] DOB: [birth year] Today's Date: 12/17/2016 Time: 7829-5621 SLP Time Calculation (min) (ACUTE ONLY): 27 min  Problem List:  Patient Active Problem List   Diagnosis Date Noted  . Encounter for orogastric (OG) tube placement   . Bad headache   . Syncope   . Endotracheally intubated   . Hypertension   . Respiratory failure with hypoxia (HCC)   . SAH (subarachnoid hemorrhage) (HCC) 12/01/2016   Past Medical History:  Past Medical History:  Diagnosis Date  . Complication of anesthesia    mom states they almost lost him but not sure if he was given to much medication  . Varicose veins    mutilple bleeding episodes, bilateral lower extremities)   Past Surgical History:  Past Surgical History:  Procedure Laterality Date  . CRANIOTOMY Left 12/02/2016   Procedure: CRANIOTOMY FOR CLIPPING OF INTRACRANIAL ANEURYSM;  Surgeon: Lisbeth Renshaw, MD;  Location: [facility] OR;  Service: Neurosurgery;  Laterality: Left;  . IR FLUORO RM 30-60 MIN  12/02/2016  . IR IVC FILTER PLMT / S&I /IMG GUID/MOD SED  12/11/2016  . IR US GUIDE VASC ACCESS RIGHT  12/02/2016  . lipo around heart  at age 65   pt states its bc they could hear his  heart beat  . ORIF ANKLE FRACTURE Left 09/02/2012   Procedure: OPEN REDUCTION INTERNAL FIXATION (ORIF) ANKLE FRACTURE;  Surgeon: Valeria Batman, MD;  Location: [facility] OR;  Service: Orthopedics;  Laterality: Left;  OPEN REDUCTION INTERNAL FIXATION LEFT DISTAL FIBULA FRACTURE WITH REDUCTION FIXATION DISTAL TIBIA/FIBULAR DIASTASIS   . RADIOLOGY WITH ANESTHESIA N/A 12/02/2016   Procedure: RADIOLOGY WITH ANESTHESIA;  Surgeon: Lisbeth Renshaw, MD;  Location: [facility] OR;  Service: Radiology;  Laterality: N/A;  . TRACHEOSTOMY TUBE PLACEMENT N/A 12/15/2016   Procedure: TRACHEOSTOMY;  Surgeon: Serena Colonel, MD;  Location: [facility] OR;  Service: ENT;  Laterality: N/A;   HPI:  Pt is a 36 y.o.maleadmitted 5/7 with SAH  secondary to aneurysm, s/p clipping 5/8 and complicated by vasospasm. He became hypoxic requiring intubation 5/8 and trach 5/2. PMH includes mordbid obesity and varicose veins.   Assessment / Plan / Recommendation Clinical Impression  Pt has improved initiation time as compared to previous date, but he still does not make spontaneous attempts to communicate. Question whether this could be in part related to his trach and his limited ability to phonate, because he does respond appropriately to questions with gestures and intermittent verbalizations. He demonstrated orientation to person, place, and year. Min cues were provided for following simple commands within a functional context. Pt will benefit from continued SLP f/u to maximize functional communciation and cognition with continued differential diagnosis of abilities.    SLP Assessment  SLP Recommendation/Assessment: Patient needs continued Speech Lanaguage Pathology Services SLP Visit Diagnosis: Cognitive communication deficit (R41.841)    Follow Up Recommendations  Inpatient Rehab    Frequency and Duration min 3x week  2 weeks      SLP Evaluation Cognition  Overall Cognitive Status: Difficult to assess Arousal/Alertness: Awake/alert Orientation Level: Oriented to person;Oriented to place;Oriented to time Problem Solving: Impaired Problem Solving Impairment: Functional basic       Comprehension  Auditory Comprehension Overall Auditory Comprehension: Impaired Commands: Impaired One Step Basic Commands: 75-100% accurate Conversation: Simple    Expression Expression Primary Mode of Expression: Verbal Verbal Expression Overall Verbal Expression:  (difficult to assess (pt doesn't initiate much, ?due to trach) Initiation: Impaired (question due to trach/limited  phonation) Automatic Speech: Name Level of Generative/Spontaneous Verbalization: Word Non-Verbal Means of Communication: Gestures   Oral / Motor  Oral Motor/Sensory  Function Overall Oral Motor/Sensory Function: Other (comment) (apepars symmetrical but with reduced oral opening) Motor Speech Overall Motor Speech:  (difficult to assess) Intelligibility: Intelligibility reduced Word: 50-74% accurate   GO                    Bryan [name], Bryan [name] 12/17/2016, 12:06 PM  Bryan [name], M.A. CCC-SLP [phone]

## 2016-12-17 NOTE — Progress Notes (Signed)
Physical Therapy Treatment Patient Details Name: Bryan KaysDarren D Fern MRN: [id number] DOB: May 03, 1981 Today's Date: 12/17/2016    History of Present Illness Patient is a 36 y/o male admitted 12/01/16 with sudden onset of headache. CTA-diffuse SAH over both convexities and extending into the basal cisterns. 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    PT Comments    Patient seen for activity progression. Tolerated increased time EOB with improvements in sitting balance and ability to engage in session. Patient also shows use of UEs to assist with mobility tasks. VSS throughout session. Current POC remains appropriate.   Follow Up Recommendations  CIR     Equipment Recommendations  Other (comment) (TBA)    Recommendations for Other Services Rehab consult     Precautions / Restrictions Precautions Precautions: Fall Precaution Comments: MAP goal 70; trach  Restrictions Weight Bearing Restrictions: No    Mobility  Bed Mobility Overal bed mobility: Needs Assistance Bed Mobility: Rolling;Supine to Sit;Sit to Supine Rolling: Max assist   Supine to sit: Max assist;+2 for physical assistance;+2 for safety/equipment Sit to supine: Max assist;+2 for physical assistance;+2 for safety/equipment;HOB elevated   General bed mobility comments: Patient able to use UEs to assist with rolling and repositioning. Also attempting to pull to sit and brace for return to supine. Assist to bring LEs to EOB and elevate trunk; assist of 2 to return to supine. Pt with posterior lean upon fatigue.  Transfers                    Ambulation/Gait                 Stairs            Wheelchair Mobility    Modified Rankin (Stroke Patients Only) Modified  Rankin (Stroke Patients Only) Pre-Morbid Rankin Score: No symptoms Modified Rankin: Severe disability     Balance Overall balance assessment: Needs assistance Sitting-balance support: Feet supported;Single extremity supported Sitting balance-Leahy Scale: Poor Sitting balance - Comments: Tolerated increased time EOB today ~15 mins, able to perform trunk control activities but fatigues wuickly Postural control: Posterior lean                                  Cognition Arousal/Alertness: Awake/alert Behavior During Therapy: Flat affect Overall Cognitive Status: Difficult to assess Area of Impairment: Attention;Following commands;Problem solving                   Current Attention Level: Sustained   Following Commands: Follows one step commands consistently;Follows one step commands with increased time     Problem Solving: Slow processing;Difficulty sequencing;Decreased initiation;Requires verbal cues General Comments: Patient more alert and engaged in session when sitting upright at EOB, following commands with improved consistency. Able to vocalize x1 with speech using PMSV      Exercises      General Comments General comments (skin integrity, edema, etc.): VSS on ATC 28% FiO2      Pertinent Vitals/Pain Pain Assessment: Faces Faces Pain Scale: No hurt    Home Living                      Prior Function  PT Goals (current goals can now be found in the care plan section) Acute Rehab PT Goals Patient Stated Goal: none stated PT Goal Formulation: Patient unable to participate in goal setting Time For Goal Achievement: 12/30/16 Potential to Achieve Goals: Fair Progress towards PT goals: Progressing toward goals    Frequency    Min 4X/week      PT Plan Current plan remains appropriate    Co-evaluation PT/OT/SLP Co-Evaluation/Treatment: Yes Reason for Co-Treatment: Complexity of the patient's impairments (multi-system  involvement) PT goals addressed during session: Mobility/safety with mobility   SLP goals addressed during session: Cognition;Communication    AM-PAC PT "6 Clicks" Daily Activity  Outcome Measure  Difficulty turning over in bed (including adjusting bedclothes, sheets and blankets)?: Total Difficulty moving from lying on back to sitting on the side of the bed? : Total Difficulty sitting down on and standing up from a chair with arms (e.g., wheelchair, bedside commode, etc,.)?: Total Help needed moving to and from a bed to chair (including a wheelchair)?: Total Help needed walking in hospital room?: Total Help needed climbing 3-5 steps with a railing? : Total 6 Click Score: 6    End of Session Equipment Utilized During Treatment: Oxygen (trach collar) Activity Tolerance: Patient limited by fatigue Patient left: in bed;with call bell/phone within reach Nurse Communication: Mobility status;Need for lift equipment PT Visit Diagnosis: Muscle weakness (generalized) (M62.81);Other symptoms and signs involving the nervous system (R29.898)     Time: 1610-9604 PT Time Calculation (min) (ACUTE ONLY): 27 min  Charges:  $Therapeutic Activity: 8-22 mins                    G Codes:       Charlotte Crumb, PT DPT  [phone]    Fabio Asa 12/17/2016, 1:28 PM

## 2016-12-17 NOTE — Progress Notes (Signed)
Pt seen and examined. No issues overnight.   EXAM: Temp:  [99 F (37.2 C)-100 F (37.8 C)] 99.2 F (37.3 C) (05/23 0800) Pulse Rate:  [72-109] 88 (05/23 0830) Resp:  [15-29] 18 (05/23 0830) BP: (91-149)/(44-117) 149/78 (05/23 0830) SpO2:  [91 %-100 %] 100 % (05/23 0830) FiO2 (%):  [28 %] 28 % (05/23 0745) Weight:  [199.1 kg (439 lb)] 199.1 kg (439 lb) (05/23 0430) Intake/Output      05/22 0701 - 05/23 0700 05/23 0701 - 05/24 0700   I.V. (mL/kg) 40 (0.2)    NG/GT 2090 50   Total Intake(mL/kg) 2130 (10.7) 50 (0.3)   Urine (mL/kg/hr) 2925 (0.6)    Emesis/NG output     Stool 200 (0)    Blood     Total Output 3125     Net -995 +50        Stool Occurrence 1 x     Awake, alert Responds to questions On trach collar Moves BUE/BLE symmetrically Wound c/d/i  LABS: Lab Results  Component Value Date   CREATININE 0.85 12/17/2016   BUN 26 (H) 12/17/2016   NA 146 (H) 12/17/2016   K 3.8 12/17/2016   CL 111 12/17/2016   CO2 28 12/17/2016   Lab Results  Component Value Date   WBC 8.7 12/17/2016   HGB 9.0 (L) 12/17/2016   HCT 31.1 (L) 12/17/2016   MCV 91.2 12/17/2016   PLT 319 12/17/2016    IMPRESSION: - 36 y.o. male s/p left Pcom clipping, appears to be neurologically intact  PLAN: - Can transfer to stepdown for neurologic standpoint of ok from PCCM - Dispo planning

## 2016-12-17 NOTE — Progress Notes (Signed)
PULMONARY / CRITICAL CARE MEDICINE   Name: Bryan [name] MRN: [id number] DOB: [birth year]    ADMISSION DATE:  12/01/2016 CONSULTATION DATE:  12/02/2016  REFERRING MD:  Dr. Patric DykesNunkumar   CHIEF COMPLAINT:  SAH  BRIEF SUMMARY:  36 y.o.malewith PMH of morbid obesity, varicose veins who presented to [facility] ED 12/01/16 with sudden onset of headache that started earlier that morning. CTA demonstrated diffuse SAH over both convexities and extending into the basal cisterns. Admitted by neurosurgery with plans for angiogram 5/8 with possible clipping.  Subsequently developed worsening hypoxemia and increased WOB requiring intubation.    SUBJECTIVE:   TC maintained BP WNL Siting edge bed  VITAL SIGNS: BP (!) 142/70   Pulse 75   Temp 99.2 F (37.3 C) (Axillary)   Resp 18   Ht 5\' 11"  (1.803 m)   Wt (!) 199.1 kg (439 lb)   SpO2 100%   BMI 61.23 kg/m   HEMODYNAMICS:    VENTILATOR SETTINGS: FiO2 (%):  [28 %] 28 %  INTAKE / OUTPUT: I/O last 3 completed shifts: In: 3309 [I.V.:369; NG/GT:2940] Out: 5145 [Urine:4525; Emesis/NG output:120; Stool:500]  PHYSICAL EXAMINATION:  General: awake, sittiing edge bed Neuro: awake, nonfocal, looks great HEENT: trach clean, cuiff down PULM: CTA CV:  s1 s2 RRR distant GI: soft, obese, NT Extremities:  Edema mild   LABS:  BMET  Recent Labs Lab 12/15/16 0827 12/16/16 0515 12/17/16 0412  NA 147* 147* 146*  K 3.9 3.7 3.8  CL 113* 113* 111  CO2 27 27 28   BUN 25* 22* 26*  CREATININE 0.84 0.89 0.85  GLUCOSE 116* 123* 110*   Electrolytes  Recent Labs Lab 12/13/16 0534 12/14/16 0501 12/15/16 0827 12/16/16 0515 12/17/16 0412  CALCIUM 9.0 9.2 9.0 8.7* 9.1  MG 2.2 2.4  --  2.4  --   PHOS 3.0 3.5  --  3.2  --    CBC  Recent Labs Lab 12/15/16 0827 12/16/16 0515 12/17/16 0412  WBC 9.6 8.1 8.7  HGB 9.2* 9.0* 9.0*  HCT 31.8* 30.8* 31.1*  PLT 312 304 319   Coag's No results for input(s): APTT, INR in the last 168 hours. Sepsis  Markers  Recent Labs Lab 12/11/16 0500  PROCALCITON 0.41   ABG  Recent Labs Lab 12/12/16 0410 12/13/16 0351 12/16/16 1125  PHART 7.423 7.396 7.379  PCO2ART 40.9 43.6 46.5  PO2ART 81.2* 87.5 90.5   Liver Enzymes No results for input(s): AST, ALT, ALKPHOS, BILITOT, ALBUMIN in the last 168 hours.  Cardiac Enzymes No results for input(s): TROPONINI, PROBNP in the last 168 hours.  Glucose  Recent Labs Lab 12/16/16 1140 12/16/16 1550 12/16/16 2007 12/17/16 0012 12/17/16 0405 12/17/16 0747  GLUCAP 115* 104* 96 85 91 100*   Imaging CXR 5/15 >> interval worsening of left lung opacity, edema vs opacity/LLL  STUDIES: CTA head 5/7 >> diffuse SAH over both convexities and extending into the basal cisterns. CXR 5/10 >> Cardiomegaly with increasing bilateral airspace opacities which may reflect edema/atelectasis  ECHO 5/10 >> EF of 65-70% and LVH, poor image to determine diastolic dysfunction Transcranial dopplers 5/11 >> poor study LE Doppler 5/15 >> prelim results with concern for probable acute DVT in left popliteal vein, left posterior tibial veins   CULTURES: MRSA 5/8 > Negative  Blood 5/11 >> 1 of 2 GPC >> Coag-negative staph Tracheal aspirate 5/15 >> Moraxella  BC 5/15 >> NTD  ANTIBIOTICS: Vancomycin 1 given 5/12 Cefepime 5/15 (? New L infiltrate) >> 5/17>>>  SIGNIFICANT EVENTS:  5/07  Admit with sudden headache, SAH. 5/08  PCCM consult > intubation >clipping of posterior communicating artery aneurysm  5/12  Vanc x1 for 1/2 coag neg staph, lasix gtt decreased, desats with WUA 5/13  Lasix gtt discontinued 5/14  Fever overnight to 102.8, pink frothy sputum late evening, difficulty oxygenating  5/15  LE duplex, concern for possible popliteal & tibial DVT 5/21 Trach placed   LINES/TUBES: ETT 5/8 >>5/21 Right upper extremity PICC 5/9 >>  Trach 5/21 >>  DISCUSSION: 36 y.o.maleadmitted 5/7 with SAH. Required intubation 5/8 due to hypoxemia and increased  WOB. Aneurysm clipped 5/8. Course complicated by vasospasm. Trached 5/2.  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, s/p clipping  P:   RASS goal: 0  Continue Nimodipine x 21 days then dc Off sedation infusion. PRN fent only PT/OT/ST today again with speech TCD dc not needed further with time period - dc MAP goals normalize Would allow neg balance further  PULMONARY A: Acute Hypoxic Respiratory Failure + Pulmonary Edema- improved New L Airspace Disease - new 5/15, edema OSA/OHS P:   Continue trach collar  Cuff down okay fo rnow, follow secretions further pmv attempt  CARDIOVASCULAR A:  HTN DVT IVC filter placed 5/17 P:  Keep tele Nimodipine   RENAL A:   Acute Kidney Injury (Base Crt 0.89) - note received single dose vancomycin 5/12, improved Hypernatremia - mild and improving (5/21 urine osm 780, serum 320, Urine Na 120, serum 147) Volume Overload improving Neg balance output almost 4 liters 5/21 P:   Am chem Free water  GASTROINTESTINAL A:   Morbid Obesity  Nutrition  GI Prophylaxis  Loose stools, cdiff neg, responding to immoddium  P:   PPI for SUP  Slp, cuff down  HEMATOLOGIC A:   VTE Prophylaxis  Possible LLE DVT - see preliminary report  P:  IVC filter in place 5/17 by IR SCD's  INFECTIOUS A:   Leukocytosis  Fever - 5/10, 5/15 Coag negative staph in one of 2 blood cultures, presumed skin contaminant P:   Monitor temps  ENDOCRINE A:   Hyperglycemia   P:   Follow CBG q4   FAMILY  - Updates:  Patient updated. And mom , to sdu  - Inter-disciplinary family meet or Palliative Care meeting due by: Ongoing   Can go to [facility]du   Mcarthur Rossetti. Tyson Alias, MD, FACP Pgr: [phone] [facility] 12/17/2016 11:02 AM

## 2016-12-18 LAB — BASIC METABOLIC PANEL
ANION GAP: 6 (ref 5–15)
BUN: 27 mg/dL — ABNORMAL HIGH (ref 6–20)
CO2: 27 mmol/L (ref 22–32)
Calcium: 9.2 mg/dL (ref 8.9–10.3)
Chloride: 111 mmol/L (ref 101–111)
Creatinine, Ser: 0.98 mg/dL (ref 0.61–1.24)
GFR calc Af Amer: >60 mL/min (ref >60)
GFR calc non Af Amer: >60 mL/min (ref >60)
Glucose, Bld: 113 mg/dL — ABNORMAL HIGH (ref 65–99)
POTASSIUM: 4 mmol/L (ref 3.5–5.1)
Sodium: 144 mmol/L (ref 135–145)

## 2016-12-18 LAB — GLUCOSE, CAPILLARY
GLUCOSE-CAPILLARY: 113 mg/dL — AB (ref 65–99)
GLUCOSE-CAPILLARY: 94 mg/dL (ref 65–99)
GLUCOSE-CAPILLARY: 95 mg/dL (ref 65–99)
Glucose-Capillary: 106 mg/dL — ABNORMAL HIGH (ref 65–99)
Glucose-Capillary: 103 mg/dL — ABNORMAL HIGH (ref 65–99)
Glucose-Capillary: 89 mg/dL (ref 65–99)

## 2016-12-18 NOTE — Progress Notes (Signed)
Physical Therapy Treatment Patient Details Name: Bryan [name] MRN: [id number] DOB: 1980/11/19 Today's Date: 12/18/2016    History of Present Illness Patient is a 36 y/o male admitted 12/01/16 with sudden onset of headache. CTA-diffuse SAH over both convexities and extending into the basal cisterns. 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    PT Comments    Patient continues to make progress toward mobility goals and able to tolerate increased time sitting EOB with grossly max A but able to maintain balance with min guard for brief periods of time. VSS throughout session and pt able to assist a little more with transfer into sitting/supine. Current plan remains appropriate.    Follow Up Recommendations  CIR     Equipment Recommendations  Other (comment) (TBD next venue)    Recommendations for Other Services Rehab consult     Precautions / Restrictions Precautions Precautions: Fall Restrictions Weight Bearing Restrictions: No    Mobility  Bed Mobility Overal bed mobility: Needs Assistance Bed Mobility: Rolling;Supine to Sit;Sit to Supine Rolling: Max assist;+2 for physical assistance   Supine to sit: Max assist;+2 for physical assistance;HOB elevated Sit to supine: Mod assist;+2 for physical assistance   General bed mobility comments: cues for sequencing and technique; assist required at trunk and bilat LE for transfers sit/supine  Transfers                    Ambulation/Gait                 Stairs            Wheelchair Mobility    Modified Rankin (Stroke Patients Only)       Balance Overall balance assessment: Needs assistance Sitting-balance support: Feet supported;Bilateral upper extremity  supported Sitting balance-Leahy Scale: Poor Sitting balance - Comments: pt able to maintain sitting balance EOB with min guard for brief periods of time and required mod/max A rest of time; pt sat >20 mins and VSS; multimodal cues given for correction back to midline as pt has posterior and L Lateral bias Postural control: Posterior lean;Left lateral lean                                  Cognition Arousal/Alertness: Awake/alert Behavior During Therapy: Flat affect Overall Cognitive Status: Difficult to assess                                 General Comments: Pt responding appropriately to all questions and commands today. Trialed use of PMSV with SLP; pt able to verbalize some throughout session.      Exercises Other Exercises Other Exercises: Pt able to perform shoulder flexion x5 each side Other Exercises: Pt able to perform knee flex/ext sitting EOB x5 each side    General Comments        Pertinent Vitals/Pain Pain Assessment: No/denies pain    Home Living                      Prior Function            PT Goals (current goals can now be found in the care plan  section) Acute Rehab PT Goals Patient Stated Goal: none stated Progress towards PT goals: Progressing toward goals    Frequency    Min 4X/week      PT Plan Current plan remains appropriate    Co-evaluation PT/OT/SLP Co-Evaluation/Treatment: Yes Reason for Co-Treatment: Complexity of the patient's impairments (multi-system involvement);To address functional/ADL transfers PT goals addressed during session: Mobility/safety with mobility;Balance;Strengthening/ROM OT goals addressed during session: ADL's and self-care SLP goals addressed during session: Swallowing;Communication    AM-PAC PT "6 Clicks" Daily Activity  Outcome Measure  Difficulty turning over in bed (including adjusting bedclothes, sheets and blankets)?: Total Difficulty moving from lying on back to  sitting on the side of the bed? : Total Difficulty sitting down on and standing up from a chair with arms (e.g., wheelchair, bedside commode, etc,.)?: Total Help needed moving to and from a bed to chair (including a wheelchair)?: Total Help needed walking in hospital room?: Total Help needed climbing 3-5 steps with a railing? : Total 6 Click Score: 6    End of Session Equipment Utilized During Treatment: Oxygen Activity Tolerance: Patient tolerated treatment well Patient left: in bed;with call bell/phone within reach Nurse Communication: Mobility status;Need for lift equipment PT Visit Diagnosis: Muscle weakness (generalized) (M62.81);Other symptoms and signs involving the nervous system (F62.130(R29.898)     Time: [id number] PT Time Calculation (min) (ACUTE ONLY): 53 min  Charges:  $Therapeutic Activity: 8-22 mins                    G Codes:       Erline LevineKellyn Johnpaul Gillentine, PTA Pager: [phone]     Carolynne EdouardKellyn R Saahil Herbster 12/18/2016, 4:12 PM

## 2016-12-18 NOTE — Progress Notes (Signed)
No issues overnight.   EXAM:  BP 125/65 (BP Location: Left Wrist)   Pulse (!) 102   Temp 100 F (37.8 C) (Rectal)   Resp 20   Ht 5\' 11"  (1.803 m)   Wt (!) 210.9 kg (465 lb)   SpO2 97%   BMI 64.85 kg/m   Awake, alert On trach collar CN grossly intact  Moves BUE/BLE symmetrically   IMPRESSION:  36 y.o. male s/p SAH, left pcom clipping. Neurologically well.  PLAN: - Will likely need CIR, stable for transfer from neurosurgical standpoint. Would check with PCCM also.

## 2016-12-18 NOTE — Progress Notes (Addendum)
PULMONARY / CRITICAL CARE MEDICINE   Name: Bryan [name] MRN: [id number] DOB: Apr 25, 1981    ADMISSION DATE:  12/01/2016 CONSULTATION DATE:  12/02/2016  REFERRING MD:  Dr. Patric DykesNunkumar   CHIEF COMPLAINT:  SAH  BRIEF SUMMARY:   36 yo male presented with HA.  Found to have SAH.  Required intubation for acute hypoxic respiratory failure.  SUBJECTIVE:   Tolerating trach collar.  VITAL SIGNS: BP (!) 124/48   Pulse (!) 104   Temp 100 F (37.8 C) (Rectal)   Resp (!) 22   Ht 5\' 11"  (1.803 m)   Wt (!) 465 lb (210.9 kg)   SpO2 97%   BMI 64.85 kg/m   INTAKE / OUTPUT: I/O last 3 completed shifts: In: 3010 [I.V.:20; Other:250; NG/GT:2740] Out: 4150 [Urine:4000; Stool:150]  PHYSICAL EXAMINATION:   General - resting comfortably Eyes - pupils reactive ENT - trach site clean Cardiac - regular, no murmur Chest - no wheeze, rales Abd - soft, non tender Ext - no edema Skin - no rashes Neuro - moves extremities   LABS:  BMET  Recent Labs Lab 12/16/16 0515 12/17/16 0412 12/18/16 0535  NA 147* 146* 144  K 3.7 3.8 4.0  CL 113* 111 111  CO2 27 28 27   BUN 22* 26* 27*  CREATININE 0.89 0.85 0.98  GLUCOSE 123* 110* 113*   Electrolytes  Recent Labs Lab 12/13/16 0534 12/14/16 0501  12/16/16 0515 12/17/16 0412 12/18/16 0535  CALCIUM 9.0 9.2  < > 8.7* 9.1 9.2  MG 2.2 2.4  --  2.4  --   --   PHOS 3.0 3.5  --  3.2  --   --   < > = values in this interval not displayed. CBC  Recent Labs Lab 12/15/16 0827 12/16/16 0515 12/17/16 0412  WBC 9.6 8.1 8.7  HGB 9.2* 9.0* 9.0*  HCT 31.8* 30.8* 31.1*  PLT 312 304 319   ABG  Recent Labs Lab 12/12/16 0410 12/13/16 0351 12/16/16 1125  PHART 7.423 7.396 7.379  PCO2ART 40.9 43.6 46.5  PO2ART 81.2* 87.5 90.5    Glucose  Recent Labs Lab 12/17/16 1151 12/17/16 1610 12/17/16 2031 12/17/16 2336 12/18/16 0309 12/18/16 0739  GLUCAP 97 90 87 92 113* 106*    STUDIES: CTA head 5/7 >> diffuse SAH over both convexities  and extending into the basal cisterns. ECHO 5/10 >> EF of 65-70% and LVH, poor image to determine diastolic dysfunction Transcranial dopplers 5/11 >> poor study LE Doppler 5/15 >> prelim results with concern for probable acute DVT in left popliteal vein, left posterior tibial veins   CULTURES: MRSA 5/8 > Negative  Blood 5/11 >> Coag-negative staph Tracheal aspirate 5/15 >> Moraxella, Haemophilus BC 5/15 >> negative  ANTIBIOTICS: Cefepime 5/15 >> 5/20  SIGNIFICANT EVENTS: 5/07  Admit with sudden headache, SAH. 5/08  PCCM consult > intubation >clipping of posterior communicating artery aneurysm  5/12  Vanc x1 for 1/2 coag neg staph, lasix gtt decreased, desats with WUA 5/13  Lasix gtt discontinued 5/14  Fever overnight to 102.8, pink frothy sputum late evening, difficulty oxygenating  5/15  LE duplex, concern for possible popliteal & tibial DVT 5/21 Trach placed, transitioned off vent  LINES/TUBES: ETT 5/8 >>5/21 Right upper extremity PICC 5/9 >>  Trach 5/21 >>  DISCUSSION: 36 y.o.maleadmitted 5/7 with SAH. Required intubation 5/8 due to hypoxemia and increased WOB. Aneurysm clipped 5/8. Course complicated by vasospasm.  ASSESSMENT / PLAN:  SAH s/p clipping of aneurysm. - per neurosurgery  Deconditioning. -  being assessed for CIR  OSA. S/p tracheostomy by ENT. - continue trach collar - trach care per ENT - speech therapy for PM valve  Dysphagia. - tube feeds - f/u with speech therapy  Lt leg DVT noted 12/09/16. - s/p IVC filter 5/17  Resolved problems >> Acute renal failure, hypernatremia, acute hypoxic respiratory failure, Moraxella/Haemophilus HCAP, hyperglycemia  PCCM will sign off.  Recommend consulting [facility] if additional medical assistance is needed.  [name], MD [facility] 12/18/2016, 11:12 AM Pager:  [phone] After 3pm call: [phone]

## 2016-12-18 NOTE — Progress Notes (Signed)
Occupational Therapy Treatment Patient Details Name: Bryan [name] MRN: [id number] DOB: Jul 31, 1980 Today's Date: 12/18/2016    History of present illness Patient is a 36 y/o male admitted 12/01/16 with sudden onset of headache. CTA-diffuse SAH over both convexities and extending into the basal cisterns. 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   OT comments  Pt tolerated sitting EOB x30 minutes this session for trial with PMSV and feeding with SLP. Pt required min guard to max assist for sitting balance; able to self correct posterior and L lateral lean with cues. Pt able to perform self feeding and oral care with min assist. Pt responding appropriately to all commands and answering questions appropriately today. Pt able to perform AROM with all 4 extremities this session. D/c plan remains appropriate. Will continue to follow acutely.   Follow Up Recommendations  CIR    Equipment Recommendations  3 in 1 bedside commode    Recommendations for Other Services Rehab consult    Precautions / Restrictions Precautions Precautions: Fall Restrictions Weight Bearing Restrictions: No       Mobility Bed Mobility Overal bed mobility: Needs Assistance Bed Mobility: Rolling;Supine to Sit;Sit to Supine Rolling: Max assist   Supine to sit: Max assist;+2 for physical assistance Sit to supine: Mod assist;+2 for physical assistance   General bed mobility comments: Pt able to use UEs and LEs to assist with bed mobility. Continues to require assist for tunk elevation and controlled descent to bed. Able to manage LEs with min assist  Transfers                      Balance Overall balance assessment: Needs assistance Sitting-balance support: Feet  supported;Bilateral upper extremity supported Sitting balance-Leahy Scale: Poor Sitting balance - Comments: Min guard to max assist required for sitting balance. Pt with posterior lean and at times L lateral lean. Pt able to adjust to midline when cued Postural control: Posterior lean;Left lateral lean                                 ADL either performed or assessed with clinical judgement   ADL Overall ADL's : Needs assistance/impaired   Eating/Feeding Details (indicate cue type and reason): Pt able to self feed sitting EOB with min guard to max assist for sitting balance. Grooming: Minimal assistance;Sitting;Wash/dry face;Oral care                                 General ADL Comments: Pt tolerated sitting EOB x30 minutes for PMSV training with SLP, self feeding, grooming, and UB/LB exercises. Level of assist for sitting balance fluctuating between min guard to max assist     Vision       Perception     Praxis      Cognition Arousal/Alertness: Awake/alert Behavior During Therapy: Flat affect Overall Cognitive Status: Difficult to assess                                 General Comments: Pt responding appropriately to all questions and commands today. Trialed use of PMSV with SLP;  pt able to verbalize some throughout session.        Exercises Exercises: Other exercises Other Exercises Other Exercises: Pt able to perform shoulder flexion x5 each side Other Exercises: Pt able to perform knee flex/ext sitting EOB x5 each side   Shoulder Instructions       General Comments      Pertinent Vitals/ Pain       Pain Assessment: No/denies pain  Home Living                                          Prior Functioning/Environment              Frequency  Min 2X/week        Progress Toward Goals  OT Goals(current goals can now be found in the care plan section)  Progress towards OT goals: Progressing toward  goals  Acute Rehab OT Goals Patient Stated Goal: none stated OT Goal Formulation: With patient  Plan Discharge plan remains appropriate    Co-evaluation    PT/OT/SLP Co-Evaluation/Treatment: Yes Reason for Co-Treatment: Complexity of the patient's impairments (multi-system involvement);To address functional/ADL transfers   OT goals addressed during session: ADL's and self-care SLP goals addressed during session: Swallowing;Communication    AM-PAC PT "6 Clicks" Daily Activity     Outcome Measure   Help from another person eating meals?: A Little Help from another person taking care of personal grooming?: A Little Help from another person toileting, which includes using toliet, bedpan, or urinal?: Total Help from another person bathing (including washing, rinsing, drying)?: Total Help from another person to put on and taking off regular upper body clothing?: Total Help from another person to put on and taking off regular lower body clothing?: Total 6 Click Score: 10    End of Session Equipment Utilized During Treatment: Oxygen  OT Visit Diagnosis: Muscle weakness (generalized) (M62.81);Cognitive communication deficit (R41.841) Symptoms and signs involving cognitive functions: Nontraumatic SAH   Activity Tolerance Patient tolerated treatment well   Patient Left in bed;with call bell/phone within reach   Nurse Communication Mobility status        Time: [id number] OT Time Calculation (min): 53 min  Charges: OT General Charges $OT Visit: 1 Procedure OT Treatments $Self Care/Home Management : 8-22 mins $Therapeutic Activity: 8-22 mins  Kiannah Grunow A. Brett Albinooffey, M.S., OTR/L Pager: [phone]   Gaye AlkenBailey A Deby Adger 12/18/2016, 1:38 PM

## 2016-12-18 NOTE — Consult Note (Addendum)
Physical Medicine and Rehabilitation Consult Reason for Consult: Decreased functional mobility Referring Physician: Dr. Conchita Paris   HPI: Bryan [name] is a 36 y.o. right handed male with morbid obesity on no prescription medications. Per chart review patient lives with parent independent prior to admission. Presented 12/01/2016 with sudden onset of headache. No recent fall or trauma reported. No history of seizures. 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 Patient developed worsening hypoxemia increasing shortness of breath requiring intubation. 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 Conchita Paris. Hospital course requiring tracheostomy 12/15/2016 per Dr. Pollyann Kennedy. Findings of acute DVT left popliteal vein as well as left posterior tibial veins and underwent placement of IVC filter per interventional radiology. Maintained on Nimotop for blood pressure control. Tube feeds for nutritional support. Physical and occupational therapy evaluations completed 12/16/2016 with recommendations of physical medicine rehabilitation consult.   Review of Systems  Unable to perform ROS: Acuity of condition   Past Medical History:  Diagnosis Date  . Complication of anesthesia    mom states they almost lost him but not sure if he was given to much medication  . Varicose veins    mutilple bleeding episodes, bilateral lower extremities)   Past Surgical History:  Procedure Laterality Date  . CRANIOTOMY Left 12/02/2016   Procedure: CRANIOTOMY FOR CLIPPING OF INTRACRANIAL ANEURYSM;  Surgeon: Lisbeth Renshaw, MD;  Location: [facility] OR;  Service: Neurosurgery;  Laterality: Left;  . IR FLUORO RM 30-60 MIN  12/02/2016  . IR IVC FILTER PLMT / S&I /IMG GUID/MOD SED   12/11/2016  . IR US GUIDE VASC ACCESS RIGHT  12/02/2016  . lipo around heart  at age 76   pt states its bc they could hear his  heart beat  . ORIF ANKLE FRACTURE Left 09/02/2012   Procedure: OPEN REDUCTION INTERNAL FIXATION (ORIF) ANKLE FRACTURE;  Surgeon: Valeria Batman, MD;  Location: [facility] OR;  Service: Orthopedics;  Laterality: Left;  OPEN REDUCTION INTERNAL FIXATION LEFT DISTAL FIBULA FRACTURE WITH REDUCTION FIXATION DISTAL TIBIA/FIBULAR DIASTASIS   . RADIOLOGY WITH ANESTHESIA N/A 12/02/2016   Procedure: RADIOLOGY WITH ANESTHESIA;  Surgeon: Lisbeth Renshaw, MD;  Location: [facility] OR;  Service: Radiology;  Laterality: N/A;  . TRACHEOSTOMY TUBE PLACEMENT N/A 12/15/2016   Procedure: TRACHEOSTOMY;  Surgeon: Serena Colonel, MD;  Location: [facility] OR;  Service: ENT;  Laterality: N/A;   Family History: No reported HTN, DM in father or mother    Social History:  reports that he has never smoked. His smokeless tobacco use includes Chew. He reports that he drinks alcohol. He reports that he does not use drugs. Allergies:  Allergies  Allergen Reactions  . Penicillins Hives and Other (See Comments)    Mouth blisters - childhood allergy Has patient had a PCN reaction causing immediate rash, facial/tongue/throat swelling, SOB or lightheadedness with hypotension: Yes Has patient had a PCN reaction causing severe rash involving mucus membranes or skin necrosis: No Has patient had a PCN reaction that required hospitalization Yes Has patient had a PCN reaction occurring within the last 10 years: No If all of the above answers are "NO", then may proceed with Cephalosporin use.   Medications Prior to Admission  Medication Sig Dispense Refill  . oxyCODONE-acetaminophen (PERCOCET/ROXICET) 5-325 MG per tablet Take 1-2 tablets by mouth every 4 (  four) hours as needed. (Patient not taking: Reported on 01/12/2015) 60 tablet 0    Home: Home Living Family/patient expects to be discharged to:: Private residence Living  Arrangements: Parent Available Help at Discharge: Family Home Access: Stairs to enter Entrance Stairs-Number of Steps: could not specify  Functional History: Prior Function Level of Independence: Independent Comments: Reports working PTA but difficult to get history due to trach. Functional Status:  Mobility: Bed Mobility Overal bed mobility: Needs Assistance Bed Mobility: Rolling, Supine to Sit, Sit to Supine Rolling: Max assist Supine to sit: Max assist, +2 for physical assistance, +2 for safety/equipment Sit to supine: Max assist, +2 for physical assistance, +2 for safety/equipment, HOB elevated General bed mobility comments: Patient able to use UEs to assist with rolling and repositioning. Also attempting to pull to sit and brace for return to supine. Assist to bring LEs to EOB and elevate trunk; assist of 2 to return to supine. Pt with posterior lean upon fatigue. Transfers General transfer comment: unable to attempt       ADL: ADL Overall ADL's : Needs assistance/impaired Eating/Feeding: NPO Grooming: Wash/dry hands, Wash/dry face, Oral care, Brushing hair, Moderate assistance, Bed level Upper Body Bathing: Maximal assistance, Bed level Lower Body Bathing: Total assistance, +2 for physical assistance, Bed level Upper Body Dressing : Total assistance, Bed level Lower Body Dressing: Total assistance, +2 for physical assistance, Bed level Toilet Transfer: Total assistance Toilet Transfer Details (indicate cue type and reason): unable to attempt  Toileting- Clothing Manipulation and Hygiene: Total assistance, +2 for physical assistance, Bed level Functional mobility during ADLs: Total assistance, +2 for physical assistance General ADL Comments: pt slow to respond, and slow to initiate activiity   Cognition: Cognition Overall Cognitive Status: Difficult to assess Arousal/Alertness: Awake/alert Orientation Level: Oriented to person, Intubated/Tracheostomy - Unable to assess,  Disoriented to situation, Disoriented to time, Disoriented to place Problem Solving: Impaired Problem Solving Impairment: Functional basic Cognition Arousal/Alertness: Awake/alert Behavior During Therapy: Flat affect Overall Cognitive Status: Difficult to assess Area of Impairment: Attention, Following commands, Problem solving Current Attention Level: Sustained Following Commands: Follows one step commands consistently, Follows one step commands with increased time Problem Solving: Slow processing, Difficulty sequencing, Decreased initiation, Requires verbal cues General Comments: Patient more alert and engaged in session when sitting upright at EOB, following commands with improved consistency. Able to vocalize x1 with speech using PMSV Difficult to assess due to: Tracheostomy  Blood pressure (!) 102/44, pulse (!) 102, temperature (!) 100.5 F (38.1 C), temperature source Rectal, resp. rate (!) 29, height 5\' 11"  (1.803 m), weight (!) 210.9 kg (465 lb), SpO2 98 %. Physical Exam  Vitals reviewed. Constitutional:  36 year old right-handed morbidly obese male  HENT:  Nasogastric tube in place. Left scalp incision clean with partial dressing  Eyes:  Pupils reactive to light. Right scleral hemorrhage  Neck:  Tracheostomy tube in place  Cardiovascular: Normal rate and regular rhythm.   Respiratory:  Decreased breath sounds at the bases  GI: Soft. Bowel sounds are normal. There is no tenderness.  Neurological:  Patient makes eye contact with examiner. He will use yes no head nods. Followed simple commands, poor oro-motor control. Moves all 4 limbs. Seems to sense pain in all 4.   Psychiatric:  Awake, flat. Delayed processing.     Results for orders placed or performed during the hospital encounter of 12/01/16 (from the past 24 hour(s))  Glucose, capillary     Status: Abnormal   Collection Time: 12/17/16  7:47 AM  Result Value Ref Range   Glucose-Capillary 100 (H) 65 - 99 mg/dL    Comment 1 Notify RN    Comment 2 Document in Chart   Glucose, capillary     Status: None   Collection Time: 12/17/16 11:51 AM  Result Value Ref Range   Glucose-Capillary 97 65 - 99 mg/dL   Comment 1 Notify RN    Comment 2 Document in Chart   Glucose, capillary     Status: None   Collection Time: 12/17/16  4:10 PM  Result Value Ref Range   Glucose-Capillary 90 65 - 99 mg/dL   Comment 1 Notify RN    Comment 2 Document in Chart   Glucose, capillary     Status: None   Collection Time: 12/17/16  8:31 PM  Result Value Ref Range   Glucose-Capillary 87 65 - 99 mg/dL  Glucose, capillary     Status: None   Collection Time: 12/17/16 11:36 PM  Result Value Ref Range   Glucose-Capillary 92 65 - 99 mg/dL  Glucose, capillary     Status: Abnormal   Collection Time: 12/18/16  3:09 AM  Result Value Ref Range   Glucose-Capillary 113 (H) 65 - 99 mg/dL  Basic metabolic panel     Status: Abnormal   Collection Time: 12/18/16  5:35 AM  Result Value Ref Range   Sodium 144 135 - 145 mmol/L   Potassium 4.0 3.5 - 5.1 mmol/L   Chloride 111 101 - 111 mmol/L   CO2 27 22 - 32 mmol/L   Glucose, Bld 113 (H) 65 - 99 mg/dL   BUN 27 (H) 6 - 20 mg/dL   Creatinine, Ser 1.61 0.61 - 1.24 mg/dL   Calcium 9.2 8.9 - 09.6 mg/dL   GFR calc non Af Amer >60 >60 mL/min   GFR calc Af Amer >60 >60 mL/min   Anion gap 6 5 - 15   Dg Abd Portable 1v  Result Date: 12/16/2016 CLINICAL DATA:  Impaired nasogastric feeding tube. EXAM: PORTABLE ABDOMEN - 1 VIEW COMPARISON:  Radiograph of Dec 15, 2016. FINDINGS: The bowel gas pattern is normal. Distal portion of nasogastric tube is seen coiled within the proximal stomach. No radio-opaque calculi are seen. IVC filter is noted. IMPRESSION: No evidence of bowel obstruction or ileus. There is been partial retraction of nasogastric tube, with distal portion coiled within proximal stomach. Electronically Signed   By: Lupita Raider, M.D.   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 Does the need for close, 24 hr/day medical supervision in concert with the patient's rehab needs make it unreasonable for this patient to be served in a less intensive setting? Yes 2. Co-Morbidities requiring supervision/potential complications: HTN, dysphagia, trach mgt 3. Due to bladder management, bowel management, safety, skin/wound care, disease management, medication administration, pain management and patient education, does the patient require 24 hr/day rehab nursing? Yes 4. Does the patient require coordinated care of a physician, rehab nurse, PT (1-2 hrs/day, 5 days/week), OT (1-2 hrs/day, 5 days/week) and SLP (1-2 hrs/day, 5 days/week) to address physical and functional deficits in the context of the above medical diagnosis(es)? Yes Addressing deficits in the following areas: balance, endurance, locomotion, strength, transferring, bowel/bladder control, bathing, dressing, feeding, grooming, toileting, cognition, speech, language, swallowing and psychosocial support 5. Can the patient actively participate in an intensive therapy program of at least 3 hrs of therapy per day  at least 5 days per week? Yes 6. The potential for patient to make measurable gains while on inpatient rehab is excellent 7. Anticipated functional outcomes upon discharge from inpatient rehab are supervision and min assist  with PT, supervision and min assist with OT, modified independent, supervision and min assist with SLP. 8. Estimated rehab length of stay to reach the above functional goals is: 20-30 days 9. Anticipated D/C setting: Home 10. Anticipated post D/C treatments: HH therapy and Outpatient therapy 11. Overall Rehab/Functional Prognosis: excellent  RECOMMENDATIONS: This patient's condition is appropriate for continued rehabilitative care in the  following setting: CIR Patient has agreed to participate in recommended program. N/A Note that insurance prior authorization may be required for reimbursement for recommended care.  Comment: Will follow along for neurological and functional progress. Rehab Admissions Coordinator to see  Thanks,  Ranelle Oyster, MD, [place] Dom    Charlton Amor., PA-C 12/18/2016

## 2016-12-18 NOTE — Progress Notes (Signed)
  Speech Language Pathology Treatment: Hillary BowPassy Muir Speaking valve;Dysphagia  Patient Details Name: Bryan [name] MRN: [id number] DOB: 07-14-1981 Today's Date: 12/18/2016 Time: [id number] SLP Time Calculation (min) (ACUTE ONLY): 40 min  Assessment / Plan / Recommendation Clinical Impression  Pt seen with PT/OT who assisted pt to edge of bed. Pt maintained arousal and attention throughout session, consistently followed commands without any delay in initiation. Participated in tooth brushing with min assist for use of UE, fed him self with spoon and cup again with min assist. PMSV tolerated for up to 10 minutes of continues placement, with occasional back pressure upon removal, breathing also striderous at times. Suspect restricted airflow given large size of trach. Pt can wear PMSV with close staff supervision for short (5minute) intervals of time. Po trials given with PMSV in place results in questionable wet respiration, difficult to assess subjectively. Will proceed with FEES tomorrow for objective evaluation of swallowing.   HPI HPI: Pt is a 36 y.o.maleadmitted 5/7 with SAH secondary to aneurysm, s/p clipping 5/8 and complicated by vasospasm. He became hypoxic requiring intubation 5/8 and trach 5/2. PMH includes mordbid obesity and varicose veins.      SLP Plan  Continue with current plan of care       Recommendations  Diet recommendations: NPO      Patient may use Passy-Muir Speech Valve: During all therapies with supervision;Intermittently with supervision PMSV Supervision: Full MD: Please consider changing trach tube to : Smaller size;Cuffless         Oral Care Recommendations: Oral care QID Follow up Recommendations: Inpatient Rehab SLP Visit Diagnosis: Cognitive communication deficit (M57.846(R41.841) Plan: Continue with current plan of care       GO               [facility] Va Medical CenterBonnie Leata Dominy, MA CCC-SLP [phone]  Claudine MoutonDeBlois, Tyreanna Bisesi [name] 12/18/2016, 11:32 AM

## 2016-12-18 NOTE — Progress Notes (Signed)
Inpatient Rehabilitation  Attempted to meet patient; however, he was asleep.  Left rehab booklets and plan to speak with mom later today.  Plan to follow along for timing of medical readiness, insurance authorization, and bed availability.    Charlane FerrettiMelissa Racquelle Hyser, M.A., CCC/SLP Admission Coordinator  [facility]  Cell [phone]

## 2016-12-19 ENCOUNTER — Inpatient Hospital Stay (HOSPITAL_COMMUNITY): Payer: BLUE CROSS/BLUE SHIELD

## 2016-12-19 LAB — GLUCOSE, CAPILLARY
GLUCOSE-CAPILLARY: 100 mg/dL — AB (ref 65–99)
GLUCOSE-CAPILLARY: 103 mg/dL — AB (ref 65–99)
GLUCOSE-CAPILLARY: 103 mg/dL — AB (ref 65–99)
GLUCOSE-CAPILLARY: 101 mg/dL — AB (ref 65–99)
Glucose-Capillary: 117 mg/dL — ABNORMAL HIGH (ref 65–99)
Glucose-Capillary: 97 mg/dL (ref 65–99)

## 2016-12-19 NOTE — Progress Notes (Addendum)
Inpatient Rehabilitation  Spoke with mom at length, yesterday evening.  She is in favor or IP Rehab and then home with family assist.  Note patient continues to require tracheal, deep suctioning every 1-2 hours with thick secretions.  Plan to continue to follow for timing of medical readiness, insurance authorization, and bed availability.  Plan for one of my co-workers to follow up next week.  Please call with questions.   Charlane FerrettiMelissa Zayla Agar, M.A., CCC/SLP Admission Coordinator  [facility]  Cell [phone]

## 2016-12-19 NOTE — Progress Notes (Signed)
No issues overnight. No complaints this am. Getting swallow study at bedside.  EXAM:  BP 116/73   Pulse 89   Temp 98.8 F (37.1 C) (Oral)   Resp 18   Ht 5\' 11"  (1.803 m)   Wt (!) 190.5 kg (420 lb)   SpO2 100%   BMI 58.58 kg/m   Awake, alert Comfortable on trach collar  CN grossly intact  Moving all extremities well   IMPRESSION:  36 y.o. male s/p Pcom clipping, doing well.  PLAN: - Plan on CIR when bed available, stable for transfer from neurosurgical standpoint.

## 2016-12-19 NOTE — Progress Notes (Signed)
Physical Therapy Treatment Patient Details Name: Bryan [name] MRN: [id number] DOB: 1981/02/28 Today's Date: 12/19/2016    History of Present Illness Patient is a 36 y/o male admitted 12/01/16 with sudden onset of headache. CTA-diffuse SAH over both convexities and extending into the basal cisterns. 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    PT Comments    Patient continues to make progress toward mobility goals. Pt with improved unsupported sitting balance this session. VSS throughout session. Pt continues to be good candidate for CIR.     Follow Up Recommendations  CIR     Equipment Recommendations  Other (comment) (TBD next venue)    Recommendations for Other Services Rehab consult     Precautions / Restrictions Precautions Precautions: Fall Restrictions Weight Bearing Restrictions: No    Mobility  Bed Mobility Overal bed mobility: Needs Assistance Bed Mobility: Rolling;Supine to Sit;Sit to Supine Rolling: Max assist;+2 for physical assistance   Supine to sit: Max assist;+2 for physical assistance;HOB elevated Sit to supine: Mod assist;+2 for physical assistance   General bed mobility comments: hand over hand assist to reach for rails when rolling and +2 assist at trunk and LE to roll with cues for sequencing; pt able to bring bilat LE most of the way to EOB and max +2 required to elevate trunk into sitting  Transfers                    Ambulation/Gait                 Stairs            Wheelchair Mobility    Modified Rankin (Stroke Patients Only)       Balance Overall balance assessment: Needs assistance Sitting-balance support: Feet supported;Bilateral upper extremity supported Sitting  balance-Leahy Scale: Fair Sitting balance - Comments: pt with improved sitting balance this session and grossly min guard with min A at times when coughing; pt able to reach outside BOS and maintain balance without assist                                    Cognition Arousal/Alertness: Awake/alert Behavior During Therapy: Flat affect Overall Cognitive Status: Difficult to assess                                        Exercises      General Comments General comments (skin integrity, edema, etc.): flexi seal out--RN aware      Pertinent Vitals/Pain Pain Assessment: No/denies pain    Home Living                      Prior Function            PT Goals (current goals can now be found in the care plan section) Acute Rehab PT Goals Patient Stated Goal: none stated Progress towards PT goals: Progressing toward goals    Frequency    Min 4X/week      PT Plan Current plan remains appropriate    Co-evaluation  AM-PAC PT "6 Clicks" Daily Activity  Outcome Measure  Difficulty turning over in bed (including adjusting bedclothes, sheets and blankets)?: Total Difficulty moving from lying on back to sitting on the side of the bed? : Total Difficulty sitting down on and standing up from a chair with arms (e.g., wheelchair, bedside commode, etc,.)?: Total Help needed moving to and from a bed to chair (including a wheelchair)?: Total Help needed walking in hospital room?: Total Help needed climbing 3-5 steps with a railing? : Total 6 Click Score: 6    End of Session Equipment Utilized During Treatment: Oxygen Activity Tolerance: Patient tolerated treatment well Patient left: in bed;with call bell/phone within reach Nurse Communication: Mobility status;Need for lift equipment PT Visit Diagnosis: Muscle weakness (generalized) (M62.81);Other symptoms and signs involving the nervous system (R29.898)     Time:  1610-9604 PT Time Calculation (min) (ACUTE ONLY): 34 min  Charges:  $Therapeutic Activity: 23-37 mins                    G Codes:       Erline Levine, PTA Pager: [phone]     Carolynne Edouard 12/19/2016, 12:14 PM

## 2016-12-19 NOTE — Progress Notes (Signed)
Modified Barium Swallow Progress Note  Patient Details  Name: Bryan [name] MRN: [id number] Date of Birth: May 22, 1981  Today's Date: 12/19/2016  Modified Barium Swallow completed.  Full report located under Chart Review in the Imaging Section.  Brief recommendations include the following:  Clinical Impression  Pt demsontrates impaored airway protection due to bilateral ulcerations and incomplete adduction of true vocal folds during the swallow, combined with presence of NG which may be impeding good epiglottic seal on arytenoids. Pt silentely aspirates all liquids durign the swallow and will not complete volitional throat clears of coughs desptie max cues. PMSV is tolerated inconsistently during eval, but even when in place, pt does not clear airway. Pt could intiate trials of puree with SLP only to cue for a second swallow to clear lateral channel residue. Recommend pt remain NPO. Would benefit from small cuffless trach for improved redirection or air to upper airway prior to retest.    Swallow Evaluation Recommendations       SLP Diet Recommendations: NPO;Alternative means - temporary       Medication Administration: Via alternative means               Oral Care Recommendations: Oral care QID       Harlon DittyBonnie Shantal Roan, MA CCC-SLP [phone]  Bryan [name], Riley NearingBonnie [name] 12/19/2016,11:35 AM

## 2016-12-19 NOTE — PMR Pre-admission (Signed)
PMR Admission Coordinator Pre-Admission Assessment  Patient: Bryan [name] is an 36 y.o., male MRN: [id number] DOB: Jan 10, 1981 Height: 5\' 11"  (180.3 cm) Weight: (!) 189.1 kg (417 lb)              Insurance Information HMO:     PPO: X     PCP:      IPA:      80/20:      OTHER:  PRIMARY: [employer] [place]      Policy#: [id number]      Subscriber: Self CM Name: Aram Beecham      Phone#: [phone]     Fax#: [phone] Pre-Cert#: [id number], given by Arabella Merles, for 12/25/17-01/08/17 with faxed updates due 01/07/17 with authorization number on coversheet      Employer: Full Time Benefits:  Phone #: [phone] transferred to out of state      Name: Verified via automated system  Eff. Date: 10/26/16     Deduct: $2700      Out of Pocket Max: [phone]      Life Max: N/A CIR: 70%/30%      SNF: 70%/30%, for 60 days  Outpatient: 70%, for 60 visits combined, 20 remaining     Co-Pay: 30% Home Health: 70%, for 60 visits      Co-Pay: 30% DME: 70%     Co-Pay: 30% Providers: in network   Medicaid Application Date:       Case Manager:  Disability Application Date:       Case Worker:   Emergency Contact Information Contact Information    Name Relation Home Work Mobile   Bryan [name] Mother   [phone]   Bryan [name] Stepmother   [phone]     Current Medical History  Patient Admitting Diagnosis: 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   History of Present Illness: Bryan [name] a 36 y.o.right handed malewith morbid obesity on no prescription medications. Per report, patient lived with grandparents, was independent and working full-time prior to admission. Presented 12/01/2016 with sudden onset of headache. No recent fall or trauma reported. No history of seizures. CT angiogram of the head showed diffuse subarachnoid hemorrhage over both convex cities and extending into the basal cisterns, there was an inferiorly and  laterally directed aneurysm arising from the communicating segment of the left ICA. Echocardiogram with ejection fraction of 70% area systolic function was vigorous. Patient developed worsening hypoxemia increasing shortness of breath requiring intubation. 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 Conchita Paris. Hospital course requiring tracheostomy 12/15/2016 per Dr. Pollyann Kennedy and speech therapy follow-up with PMSV. Findings of acute DVT left popliteal vein as well as left posterior tibial veins and underwent placement of IVC filter per interventional radiology05/17/2018.  Tube feeds for nutritional support with diet advanced to dysphagia 3 honey-thick liquids. Speech, physical, and occupational therapy evaluations completed 12/16/2016 with recommendations of physical medicine rehabilitation consult. Patient was admitted for a comprehensive rehabilitation program 12/26/16.  NIH Total: 2    Past Medical History  Past Medical History:  Diagnosis Date  . Complication of anesthesia    mom states they almost lost him but not sure if he was given to much medication  . Varicose veins    mutilple bleeding episodes, bilateral lower extremities)    Family History  family history is not on file.  Prior Rehab/Hospitalizations:  Has the patient had major surgery during 100 days prior to admission? No  Current Medications   Current  Facility-Administered Medications:  .  acetaminophen (TYLENOL) tablet 650 mg, 650 mg, Oral, Q4H PRN, 650 mg at 12/23/16 2309 **OR** acetaminophen (TYLENOL) solution 650 mg, 650 mg, Per Tube, Q4H PRN, 650 mg at 12/18/16 1704 **OR** acetaminophen (TYLENOL) suppository 650 mg, 650 mg, Rectal, Q4H PRN, Julio Sicks, MD .  albuterol (PROVENTIL) (2.5 MG/3ML) 0.083% nebulizer solution 2.5 mg, 2.5 mg, Nebulization, Q4H PRN, Lisbeth Renshaw, MD, 2.5 mg at 12/16/16 0730 .  chlorhexidine (PERIDEX) 0.12 % solution 15 mL, 15 mL, Mouth  Rinse, BID, Nelda Bucks, MD, 15 mL at 12/25/16 2200 .  Chlorhexidine Gluconate Cloth 2 % PADS 6 each, 6 each, Topical, Q0600, Lisbeth Renshaw, MD, 6 each at 12/26/16 0600 .  feeding supplement (PRO-STAT SUGAR FREE 64) liquid 30 mL, 30 mL, Oral, BID, Lisbeth Renshaw, MD, 30 mL at 12/25/16 2146 .  hydrALAZINE (APRESOLINE) injection 10 mg, 10 mg, Intravenous, Q6H PRN, Tobey Grim, NP, 10 mg at 12/09/16 2022 .  lipase/protease/amylase (CREON) capsule 36,000 Units, 36,000 Units, Oral, Once, Nelda Bucks, MD .  loperamide (IMODIUM) 1 MG/5ML solution 4 mg, 4 mg, Per Tube, PRN, Nelda Bucks, MD, 4 mg at 12/19/16 1230 .  MEDLINE mouth rinse, 15 mL, Mouth Rinse, q12n4p, Nelda Bucks, MD, 15 mL at 12/25/16 1107 .  mupirocin ointment (BACTROBAN) 2 % 1 application, 1 application, Nasal, BID, Lisbeth Renshaw, MD, 1 application at 12/25/16 2300 .  ondansetron (ZOFRAN-ODT) disintegrating tablet 4 mg, 4 mg, Oral, Q6H PRN **OR** ondansetron (ZOFRAN) injection 4 mg, 4 mg, Intravenous, Q6H PRN, Julio Sicks, MD .  [DISCONTINUED] pantoprazole (PROTONIX) EC tablet 40 mg, 40 mg, Oral, Daily **OR** pantoprazole sodium (PROTONIX) 40 mg/20 mL oral suspension 40 mg, 40 mg, Per Tube, Daily, Pool, Sherilyn Cooter, MD, 40 mg at 12/25/16 1100 .  RESOURCE THICKENUP CLEAR, , Oral, PRN, Lisbeth Renshaw, MD .  sodium chloride flush (NS) 0.9 % injection 10-40 mL, 10-40 mL, Intracatheter, Q12H, Lisbeth Renshaw, MD, 10 mL at 12/25/16 2147 .  sodium chloride flush (NS) 0.9 % injection 10-40 mL, 10-40 mL, Intracatheter, PRN, Lisbeth Renshaw, MD, 10 mL at 12/23/16 1728 .  sulfamethoxazole-trimethoprim (BACTRIM DS,SEPTRA DS) 800-160 MG per tablet 1 tablet, 1 tablet, Oral, Q12H, Costella, Darci Current, PA-C, 1 tablet at 12/25/16 1731  Patients Current Diet: DIET DYS 3 Room service appropriate? 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  Home Assistive Devices / Equipment Home Assistive Devices/Equipment: None  Prior Device Use: Indicate devices/aids used by the patient prior to current illness, exacerbation or injury? None of the above  Prior Functional Level Prior Function Level of Independence: Independent Comments: Reports working PTA but difficult to get history due to trach.  Self Care: Did the patient need help bathing, dressing, using the toilet or eating?Independent  Indoor Mobility: Did the patient need assistance with walking from room to room (with or without device)? Independent  Stairs: Did the patient need assistance with internal or external stairs (with or without device)? Independent  Functional Cognition: Did the patient need help planning regular tasks such as shopping or remembering to take medications? Independent  Current Functional Level Cognition  Arousal/Alertness: Awake/alert Overall Cognitive Status: Impaired/Different from baseline Difficult to assess due to: Tracheostomy Current Attention Level: Selective Orientation Level: Oriented to person, Oriented to place, Oriented to time, Disoriented to situation Following Commands: Follows multi-step commands inconsistently Safety/Judgement: Decreased awareness of safety General Comments: PMV in place, pt could not remember if he took the TC off or if the RN took it off.  He at times will say some non-sensical things.  I asked, "Did you have a good lunch?" and he responded with "I didn't drive" and this happened a few times during my session.  Problem Solving: Impaired Problem Solving Impairment: Functional  basic    Extremity Assessment (includes Sensation/Coordination)  Upper Extremity Assessment: Generalized weakness  Lower Extremity Assessment: Defer to PT evaluation    ADLs  Overall ADL's : Needs assistance/impaired Eating/Feeding: NPO Eating/Feeding Details (indicate cue type and reason): Pt eager to progress to normal diet  Grooming: Minimal assistance, Sitting, Wash/dry face, Oral care Upper Body Bathing: Moderate assistance, Sitting Lower Body Bathing: Moderate assistance, Sit to/from stand Upper Body Dressing : Maximal assistance, Sitting Lower Body Dressing: Maximal assistance, Sit to/from stand Lower Body Dressing Details (indicate cue type and reason): Pt able to lean forward to mid calf in attempts to doff socks  Toilet Transfer: Moderate assistance, Stand-pivot, BSC Toilet Transfer Details (indicate cue type and reason): Pt fatigued.  Required mod A +2 to move sit to stand (has been in chair all day).  He was incontinent of stool  Toileting- Clothing Manipulation and Hygiene: Total assistance, Sit to/from stand Toileting - Clothing Manipulation Details (indicate cue type and reason): Pt stood x 5 mins for peri care due to bowel incontinence  Functional mobility during ADLs: Moderate assistance, +2 for safety/equipment General ADL Comments: Pt would not engage in ADLs session.  Attempted to establish goals with pt, however, he would not engage stating, you need to stop asking me questions.  Pt's mother arrived.  She reports pt was independent PTA, lived with grandparents.  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   Pt does indicate he feels frightened by diagnosis, but when I attempted to delve or encourage him, he denied feeling this way.   Spoke with pt and established a plan for OOB tomorrow which he agreed to, however, unsure if he will recall this     Mobility  Overal bed mobility: Needs  Assistance Bed Mobility: Supine to Sit Rolling: Max assist, +2 for physical assistance Supine to sit: Min assist, HOB elevated Sit to supine: Min assist General bed mobility comments: Min assist to provide a hand to pull once most of the way up.  Pt using momentum to get to EOB from supine.  HOB only minimally elevated.     Transfers  Overall transfer level: Needs assistance Equipment used: Rolling walker (2 wheeled) Transfers: Sit to/from Stand Sit to Stand: Mod assist Stand pivot transfers: Min assist, +2 physical assistance General transfer comment: mod A to boost into standing     Ambulation / Gait / Stairs / Wheelchair Mobility  Ambulation/Gait Ambulation/Gait assistance: +2 safety/equipment, Min assist Ambulation Distance (Feet): 15 Feet Assistive device: Rolling walker (2 wheeled) Gait Pattern/deviations: Step-through pattern, Trunk flexed General Gait Details: Pt has shoes donned, ambulated with RW to the bathroom where he attempted to stand to urinate.  He did not get urine into the commode and urinated all over the floor of the bathroom.  HR at this point was 140 and O2 sats were down in the 80s on RA with PMV donned.  recliner chair brought to the bathroom door and pt sat down.  TC O2 re-applied, but once sitting pt's sats were back into the 90s before I could get the TC back on him.  He may benefit from [facility] for walking to help maintain sats.  Pt was getting 3/4 DOE with short distance gait and attempting to stand to urinate.  Gait velocity: decreased Gait velocity interpretation: Below normal speed for age/gender    Posture / Balance Dynamic Sitting Balance Sitting balance - Comments: Pt able to lean forward on EOC to try to access feet to try to remove socks with unilateral UE support   Balance Overall balance assessment: Needs assistance Sitting-balance support: Feet supported, Bilateral upper extremity supported Sitting balance-Leahy Scale: Fair Sitting balance -  Comments: Pt able to lean forward on EOC to try to access feet to try to remove socks with unilateral UE support   Postural control: Posterior lean, Left lateral lean Standing balance support: Bilateral upper extremity supported Standing balance-Leahy Scale: Poor Standing balance comment: requires UE support     Special needs/care consideration BiPAP/CPAP: No  CPM: No Continuous Drip IV: No Dialysis: No         Life Vest: No Oxygen: 28% FiO2 via trach collar Special Bed: Bariatric bed with air mattress overlay  Trach Size: 8 XLT cuffed trach with PMSV  Wound Vac (area): No       Skin: WDL, dry                                Bowel mgmt: 5/30 incontinent Bladder mgmt: Foley removed 5/30 monitoring output ? UTI Diabetic mgmt: No     Previous Home Environment Living Arrangements: Parent Available Help at Discharge: Family Home Access: Stairs to enter Entrance Stairs-Number of Steps: could not specify Home Care Services: No  Discharge Living Setting Plans for Discharge Living Setting: Lives with (comment) (Grandparents ) Type of Home at Discharge: House Discharge Home Layout: One level Discharge Home Access: Stairs to enter Entrance Stairs-Rails: None Entrance Stairs-Number of Steps: 2 Discharge Bathroom Shower/Tub: Tub/shower unit, Curtain Discharge Bathroom Toilet: Standard Discharge Bathroom Accessibility: Yes How Accessible: Accessible via walker Does the patient have any problems obtaining your medications?: No  Social/Family/Support Systems Patient Roles: Other (Comment) (Son, grandson, sibling) Contact Information: Mom: Clearance CootsValinda Malloy [phone] Anticipated Caregiver: Family Anticipated Caregiver's Contact Information: see above  Ability/Limitations of Caregiver: None Caregiver Availability: 24/7 Discharge Plan Discussed with Primary Caregiver: Yes Is Caregiver In Agreement with Plan?: Yes Does Caregiver/Family have Issues with Lodging/Transportation while Pt is  in Rehab?: No  Goals/Additional Needs Patient/Family Goal for Rehab: PT Min assist, OT/SLP Supervision-Min assist  Expected length of stay: 20-30 days  Cultural Considerations: Baptist  Dietary Needs: NPO with NG  Equipment Needs: Bariatric equipment needs TBD Special Service Needs: None Additional Information: Mom and family will go to grandparent's house and assist as needed  Pt/Family Agrees to Admission and willing to participate: Yes Program Orientation Provided & Reviewed with Pt/Caregiver Including Roles  & Responsibilities: Yes Additional Information Needs: None Information Needs to be Provided By: None  Decrease burden of Care through IP rehab admission: No  Possible need for SNF placement upon discharge: No  Patient Condition: This patient's medical and functional status has changed since the consult dated: 12/18/16 in which the Rehabilitation  Physician determined and documented that the patient's condition is appropriate for intensive rehabilitative care in an inpatient rehabilitation facility. See "History of Present Illness" (above) for medical update. Functional changes are: Mod +2 transfers and gait with EVA walker. Patient's medical and functional status update has been discussed with the Rehabilitation physician and patient remains appropriate for inpatient rehabilitation. Will admit to inpatient rehab today.  Preadmission Screen Completed By:  Fae Pippin, 12/26/2016 10:11 AM ______________________________________________________________________   Discussed status with Dr. Riley Kill on 12/26/16 at 1010 and received telephone approval for admission today.  Admission Coordinator:  Fae Pippin, time 1010/Date 12/26/16

## 2016-12-20 LAB — GLUCOSE, CAPILLARY
GLUCOSE-CAPILLARY: 106 mg/dL — AB (ref 65–99)
GLUCOSE-CAPILLARY: 109 mg/dL — AB (ref 65–99)
GLUCOSE-CAPILLARY: 112 mg/dL — AB (ref 65–99)
Glucose-Capillary: 107 mg/dL — ABNORMAL HIGH (ref 65–99)
Glucose-Capillary: 110 mg/dL — ABNORMAL HIGH (ref 65–99)
Glucose-Capillary: 101 mg/dL — ABNORMAL HIGH (ref 65–99)
Glucose-Capillary: 109 mg/dL — ABNORMAL HIGH (ref 65–99)

## 2016-12-20 NOTE — Progress Notes (Signed)
Subjective: Patient resting comfortably in bed, with trach collar.  Objective: Vital signs in last 24 hours: Vitals:   12/20/16 0433 12/20/16 0743 12/20/16 0759 12/20/16 0904  BP:  (!) 133/91 (!) 133/91 110/65  Pulse:  91 86 (!) 114  Resp:   18 15  Temp:  98.1 F (36.7 C)    TempSrc:  Axillary    SpO2:   100% 95%  Weight: (!) 190.5 kg (420 lb)     Height:        Intake/Output from previous day: 05/25 0701 - 05/26 0700 In: 1800 [NG/GT:1800] Out: 2410 [Urine:2410] Intake/Output this shift: No intake/output data recorded.  Physical Exam:  Awake and alert, following commands. No drift of upper extremities.  BMET  Recent Labs  12/18/16 0535  NA 144  K 4.0  CL 111  CO2 27  GLUCOSE 113*  BUN 27*  CREATININE 0.98  CALCIUM 9.2    Assessment/Plan: Continuing supportive care. For CIR per Dr. Conchita ParisNundkumar.   Hewitt ShortsNUDELMAN,ROBERT W, MD 12/20/2016, 9:35 AM

## 2016-12-20 NOTE — Plan of Care (Signed)
Problem: Education: Goal: Knowledge of [facility] Education information/materials will improve Outcome: Progressing Discussed plan of care for the evening with the patient and some teach back displayed.  Comments: Tried calling mom back at [phone] with no success at this time

## 2016-12-21 LAB — GLUCOSE, CAPILLARY
GLUCOSE-CAPILLARY: 92 mg/dL (ref 65–99)
GLUCOSE-CAPILLARY: 114 mg/dL — AB (ref 65–99)
Glucose-Capillary: 106 mg/dL — ABNORMAL HIGH (ref 65–99)
Glucose-Capillary: 106 mg/dL — ABNORMAL HIGH (ref 65–99)

## 2016-12-21 NOTE — Plan of Care (Signed)
Problem: Activity: Goal: Risk for activity intolerance will decrease Outcome: Progressing Discussed with patient and mom about today's actvities with some teach back displayed.  Comments: Talked about his more positive attitude and interactions today with the need for movement.

## 2016-12-21 NOTE — Progress Notes (Signed)
No issues overnight. No complaints this am  EXAM:  BP 96/81   Pulse 76   Temp 98.8 F (37.1 C) (Oral)   Resp 16   Ht 5\' 11"  (1.803 m)   Wt (!) 200.5 kg (442 lb)   SpO2 100%   BMI 61.65 kg/m   Awake, alert  CN grossly intact  5/5 BUE/BLE  Wound c/d/i  IMPRESSION:  36 y.o. male s/p SAH and left pcom clipping, doing well.   PLAN: - Cont to mobilize as tolerated - CIR when bed available

## 2016-12-22 LAB — GLUCOSE, CAPILLARY
GLUCOSE-CAPILLARY: 95 mg/dL (ref 65–99)
Glucose-Capillary: 102 mg/dL — ABNORMAL HIGH (ref 65–99)
Glucose-Capillary: 103 mg/dL — ABNORMAL HIGH (ref 65–99)
Glucose-Capillary: 105 mg/dL — ABNORMAL HIGH (ref 65–99)
Glucose-Capillary: 90 mg/dL (ref 65–99)
Glucose-Capillary: 99 mg/dL (ref 65–99)

## 2016-12-22 NOTE — Progress Notes (Signed)
Patient pulled out cortrak tube. No cortrak service at this time. Attempted to place NGT per protocol. Patient unable to tolerate. Notified Neurosurgery on call [facility](Castelo PA).

## 2016-12-22 NOTE — Progress Notes (Signed)
No issues overnight.   EXAM:  BP 125/80   Pulse 89   Temp 99.2 F (37.3 C) (Oral)   Resp 19   Ht 5\' 11"  (1.803 m)   Wt (!) 196.9 kg (434 lb)   SpO2 100%   BMI 60.53 kg/m   Awake, alert,  On trach collar  CN grossly intact  Moves all extremities well Wound c/d/i   IMPRESSION:  36 y.o. male s/p SAH left Pcom clipping. Neurologically stable  PLAN: - Awaiting CIR placement

## 2016-12-22 NOTE — Progress Notes (Signed)
Rehab admissions - I spoke with RN on the unit.  She is not suctioning patient very often.  Patient coughs up secretions on his own every couple hours.  Mom was at the bedside today.  I will have my partner follow up again tomorrow.  Call me for questions.  #[phone]

## 2016-12-22 NOTE — Progress Notes (Signed)
  Speech Language Pathology Treatment: Dysphagia;Passy Muir Speaking valve  Patient Details Name: Bryan [name] MRN: [id number] DOB: [birth year] Today's Date: 12/22/2016 Time: [id number] SLP Time Calculation (min) (ACUTE ONLY): 18 min  Assessment / Plan / Recommendation Clinical Impression  Noted pt without means of nutrition or medication. Provided verbal cues and modeling to train pt in increased breath support for forceful volitional expectoration. Initially pt would not respond to cues, did not follow commands, responded he couldn't do it. Pt had to be given bites of puree as positive reinforcement to participate in interventions. By the end of the session pt was consistently clearing his throat and swallowing a second time after each bite and sip with verbal reminders for each bite. Pt tolerated PMSV but needed cues for deep breathing to achieve phonation. Pt needs a smaller, cuffless trach for better phonation and consistent PMSV tolerance.   For now, RN may give meds in puree with PMSV in place and give verbal cues for pt to clear throat and swallow again. WIll plan for repeat FEES tomorrow to reassess for readiness to start diet.   HPI HPI: Pt is a 36 y.o.maleadmitted 5/7 with SAH secondary to aneurysm, s/p clipping 5/8 and complicated by vasospasm. He became hypoxic requiring intubation 5/8 and trach 5/2. PMH includes mordbid obesity and varicose veins.      SLP Plan  Continue with current plan of care       Recommendations  Diet recommendations: NPO Medication Administration: Whole meds with puree Supervision: Full supervision/cueing for compensatory strategies Compensations: Slow rate;Small sips/bites;Clear throat intermittently;Multiple dry swallows after each bite/sip      Patient may use Passy-Muir Speech Valve: During all therapies with supervision;Intermittently with supervision PMSV Supervision: Full MD: Please consider changing trach tube to : Smaller size;Cuffless          Plan: Continue with current plan of care       GO               Tippah County HospitalBonnie Leota Maka, MA CCC-SLP [phone]  Bryan [name], Bryan [name] [name] 12/22/2016, 2:46 PM

## 2016-12-22 NOTE — Progress Notes (Signed)
Physical Therapy Treatment Patient Details Name: Bryan KaysDarren D [name] MRN: [id number] DOB: 1980/10/12 Today's Date: 12/22/2016    History of Present Illness Patient is a 36 y/o male admitted 12/01/16 with sudden onset of headache. CTA-diffuse SAH over both convexities and extending into the basal cisterns. 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    PT Comments    Patient continues to progress toward mobility goals. Pt able to stand at EOB with min A for brief period of time. Pt declined stand pivot to recliner or ambulation. Pt encouraged to increase mobility and educated on benefits of OOB mobility and importance for recovery. Mother present and provided encouragement. Continue to progress as tolerated.    Follow Up Recommendations  CIR     Equipment Recommendations  Other (comment) (TBD next venue)    Recommendations for Other Services Rehab consult     Precautions / Restrictions Precautions Precautions: Fall Restrictions Weight Bearing Restrictions: No    Mobility  Bed Mobility Overal bed mobility: Needs Assistance Bed Mobility: Supine to Sit;Sit to Supine     Supine to sit: +2 for physical assistance;HOB elevated;Mod assist Sit to supine: Mod assist   General bed mobility comments: +2 assist to elevate trunk into sitting with use of bed rails and HOB elevated; cues for sequencing; pt able to bring bilat LE to EOB with min A; mod A to bring bilat LE into bed  Transfers Overall transfer level: Needs assistance Equipment used: Rolling walker (2 wheeled) Transfers: Sit to/from Stand Sit to Stand: Min assist;From elevated surface         General transfer comment: cues for safe hand placement; assist to steady upon standing while pt  transitioning hands to RW; pt able to power up into standing with min guard for safety  Ambulation/Gait                 Stairs            Wheelchair Mobility    Modified Rankin (Stroke Patients Only)       Balance Overall balance assessment: Needs assistance Sitting-balance support: Feet supported;Bilateral upper extremity supported Sitting balance-Leahy Scale: Good       Standing balance-Leahy Scale: Poor                              Cognition Arousal/Alertness: Awake/alert Behavior During Therapy: Flat affect Overall Cognitive Status: Difficult to assess                                        Exercises      General Comments General comments (skin integrity, edema, etc.): mother present      Pertinent Vitals/Pain Pain Assessment: No/denies pain    Home Living                      Prior Function            PT Goals (current goals can now be found in the care plan section) Acute Rehab PT Goals Patient Stated Goal: none stated Progress towards PT goals: Progressing toward goals    Frequency    Min 4X/week  PT Plan Current plan remains appropriate    Co-evaluation              AM-PAC PT "6 Clicks" Daily Activity  Outcome Measure  Difficulty turning over in bed (including adjusting bedclothes, sheets and blankets)?: Total Difficulty moving from lying on back to sitting on the side of the bed? : Total Difficulty sitting down on and standing up from a chair with arms (e.g., wheelchair, bedside commode, etc,.)?: Total Help needed moving to and from a bed to chair (including a wheelchair)?: A Little Help needed walking in hospital room?: A Little Help needed climbing 3-5 steps with a railing? : A Lot 6 Click Score: 11    End of Session Equipment Utilized During Treatment: Oxygen Activity Tolerance: Patient tolerated treatment well Patient left: in bed;with call bell/phone within  reach Nurse Communication: Mobility status;Need for lift equipment PT Visit Diagnosis: Muscle weakness (generalized) (M62.81);Other symptoms and signs involving the nervous system (R29.898)     Time: 4098-1191 PT Time Calculation (min) (ACUTE ONLY): 31 min  Charges:  $Therapeutic Activity: 23-37 mins                    G Codes:       Erline Levine, PTA Pager: [phone]     Carolynne Edouard 12/22/2016, 4:18 PM

## 2016-12-23 LAB — GLUCOSE, CAPILLARY
GLUCOSE-CAPILLARY: 103 mg/dL — AB (ref 65–99)
GLUCOSE-CAPILLARY: 108 mg/dL — AB (ref 65–99)
GLUCOSE-CAPILLARY: 129 mg/dL — AB (ref 65–99)
GLUCOSE-CAPILLARY: 139 mg/dL — AB (ref 65–99)
GLUCOSE-CAPILLARY: 141 mg/dL — AB (ref 65–99)
Glucose-Capillary: 103 mg/dL — ABNORMAL HIGH (ref 65–99)
Glucose-Capillary: 99 mg/dL (ref 65–99)

## 2016-12-23 MED ORDER — RESOURCE THICKENUP CLEAR PO POWD
ORAL | Status: DC | PRN
  Filled 2016-12-23: qty 125

## 2016-12-23 NOTE — Progress Notes (Signed)
PT Cancellation Note  Patient Details Name: Bryan [name] MRN: [id number] DOB: 02/28/1981   Cancelled Treatment:    Reason Eval/Treat Not Completed: Other (comment) Goals updated as pt has progressed per PTA note. See new goals. thanks   Bryan [name] 12/23/2016, 4:43 PM Bryan [name], PT, DPT [phone]

## 2016-12-23 NOTE — Progress Notes (Signed)
No issues overnight. Has copious secretions.  EXAM:  BP 123/83 (BP Location: Left Wrist)   Pulse (!) 115   Temp (!) 102.4 F (39.1 C) (Axillary)   Resp (!) 33   Ht 5\' 11"  (1.803 m)   Wt (!) 179.6 kg (396 lb)   SpO2 100%   BMI 55.23 kg/m   Awake, alert, CN grossly intact  5/5 BUE/BLE  Wound c/d/i  IMPRESSION:  36 y.o. male s/p ruptured left Pcom clipping. Appears to be progressing well. Should be ok for transfer to CIR if they can handle his secretions.  PLAN: - Cont current mgmt, can likely transfer to rehab

## 2016-12-23 NOTE — Progress Notes (Signed)
NUTRITION NOTE  Consult received for replacement of Cortrak tube. Upon arrival to pt's room, SLP conducting FEES and and informs RD of plan for recommendation of diet advancement. Cortrak tube not warranted at this time. Please re-consult should need arise. RD will continue to follow per protocol.     Trenton GammonJessica Scarleth Brame, MS, RD, LDN, [facility] Inpatient Clinical Dietitian Pager # [phone] After hours/weekend pager # [phone]

## 2016-12-23 NOTE — Progress Notes (Signed)
Occupational Therapy Treatment Patient Details Name: Bryan KaysDarren D Ferriss MRN: [id number] DOB: 06/18/1981 Today's Date: 12/23/2016    History of present illness Patient is a 36 y/o male admitted 12/01/16 with sudden onset of headache. CTA-diffuse SAH over both convexities and extending into the basal cisterns. 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   OT comments  Pt was seen with PMV in place.  He thinks he is in [place], and states I am lying when informed he is in [place].  He will follow commands intermittently, and will engage intermittently. He indicates he doesn't feel well today.  Attempted to establish goals with pt in attempts of increasing motivation, but he would not to this with me.  He did nod that he needs to be able to walk to go home, but states he can do that now without difficulty.  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  And, she reports his Current behaviors are out of character for him.  Did establish a plan for OOB tomorrow which he agreed to.     Follow Up Recommendations  CIR    Equipment Recommendations  3 in 1 bedside commode    Recommendations for Other Services Rehab consult    Precautions / Restrictions Precautions Precautions: Fall Restrictions Weight Bearing Restrictions: No       Mobility Bed Mobility Overal bed mobility: Needs Assistance Bed Mobility: Supine to Sit;Sit to Supine     Supine to sit: HOB elevated;Min guard Sit to supine: Min guard   General bed mobility comments: min guard for safety; use of bed rail and increased time and effort; max A +2 to reposition in bed with cues for sequencing and use of bed rails to assist in scooting up in  bed  Transfers                      Balance Overall balance assessment: Needs assistance Sitting-balance support: Feet supported Sitting balance-Leahy Scale: Good                                     ADL either performed or assessed with clinical judgement   ADL                                         General ADL Comments: Pt would not engage in ADLs session.  Attempted to establish goals with pt, however, he would not engage stating, you need to stop asking me questions.  Pt's mother arrived.  She reports pt was independent PTA, lived with grandparents.  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   Pt does indicate he feels frightened by diagnosis, but when I attempted to delve or encourage him, he denied feeling this way.   Spoke with pt and established a plan for OOB tomorrow which he agreed to, however, unsure if he will recall this      Vision       Perception  Praxis      Cognition Arousal/Alertness: Awake/alert Behavior During Therapy: Flat affect Overall Cognitive Status: Impaired/Different from baseline Area of Impairment: Orientation;Attention;Memory;Awareness;Problem solving                 Orientation Level: Disoriented to;Place;Time Current Attention Level: Sustained Memory: Decreased short-term memory Following Commands: Follows one step commands inconsistently   Awareness: Intellectual Problem Solving: Slow processing;Decreased initiation;Requires verbal cues General Comments: PMV in place.  Pt states he is in [place], and states "you're lying" when he was told he was in [place] - continues to insist he is in [place].  He knows he has surgery, but states he thinks he can walk and go home today         Exercises     Shoulder Instructions       General Comments      Pertinent Vitals/ Pain       Pain Assessment: No/denies  pain  Home Living                                          Prior Functioning/Environment              Frequency  Min 2X/week        Progress Toward Goals  OT Goals(current goals can now be found in the care plan section)  Progress towards OT goals: Not progressing toward goals - comment  Acute Rehab OT Goals Patient Stated Goal: none stated  Plan Discharge plan remains appropriate    Co-evaluation                 AM-PAC PT "6 Clicks" Daily Activity     Outcome Measure   Help from another person eating meals?: A Little Help from another person taking care of personal grooming?: A Little Help from another person toileting, which includes using toliet, bedpan, or urinal?: Total Help from another person bathing (including washing, rinsing, drying)?: Total Help from another person to put on and taking off regular upper body clothing?: Total Help from another person to put on and taking off regular lower body clothing?: Total 6 Click Score: 10    End of Session Equipment Utilized During Treatment: Oxygen  OT Visit Diagnosis: Muscle weakness (generalized) (M62.81);Cognitive communication deficit (R41.841) Symptoms and signs involving cognitive functions: Nontraumatic SAH   Activity Tolerance Other (comment) (not feeling well )   Patient Left in bed;with call bell/phone within reach;with family/visitor present   Nurse Communication          Time: 1241-1315 OT Time Calculation (min): 34 min  Charges: OT General Charges $OT Visit: 1 Procedure OT Treatments $Cognitive Skills Development: 23-37 mins  [employer], OTR/L [phone]    Jeani Hawking M 12/23/2016, 3:35 PM

## 2016-12-23 NOTE — Progress Notes (Signed)
Rt has had the Pt for 2 days and his secretion are moderate to copious. Janina Mayorach ties have been changed both days and around the Pt's neck are copious secretion that need to be cleaned each visit. RT will continue to monitor

## 2016-12-23 NOTE — Procedures (Signed)
Objective Swallowing Evaluation: Type of Study: FEES-Fiberoptic Endoscopic Evaluation of Swallow  Patient Details  Name: Bryan KaysDarren D Ploch MRN: [id number] Date of Birth: Dec 23, 1980  Today's Date: 12/23/2016 Time: SLP Start Time (ACUTE ONLY): 0907-SLP Stop Time (ACUTE ONLY): 0930 SLP Time Calculation (min) (ACUTE ONLY): 23 min  Past Medical History:  Past Medical History:  Diagnosis Date  . Complication of anesthesia    mom states they almost lost him but not sure if he was given to much medication  . Varicose veins    mutilple bleeding episodes, bilateral lower extremities)   Past Surgical History:  Past Surgical History:  Procedure Laterality Date  . CRANIOTOMY Left 12/02/2016   Procedure: CRANIOTOMY FOR CLIPPING OF INTRACRANIAL ANEURYSM;  Surgeon: Lisbeth RenshawNundkumar, Neelesh, MD;  Location: [facility] OR;  Service: Neurosurgery;  Laterality: Left;  . IR FLUORO RM 30-60 MIN  12/02/2016  . IR IVC FILTER PLMT / S&I /IMG GUID/MOD SED  12/11/2016  . IR US GUIDE VASC ACCESS RIGHT  12/02/2016  . lipo around heart  at age 36   pt states its bc they could hear his  heart beat  . ORIF ANKLE FRACTURE Left 09/02/2012   Procedure: OPEN REDUCTION INTERNAL FIXATION (ORIF) ANKLE FRACTURE;  Surgeon: Valeria BatmanPeter W Whitfield, MD;  Location: [facility] OR;  Service: Orthopedics;  Laterality: Left;  OPEN REDUCTION INTERNAL FIXATION LEFT DISTAL FIBULA FRACTURE WITH REDUCTION FIXATION DISTAL TIBIA/FIBULAR DIASTASIS   . RADIOLOGY WITH ANESTHESIA N/A 12/02/2016   Procedure: RADIOLOGY WITH ANESTHESIA;  Surgeon: Lisbeth RenshawNundkumar, Neelesh, MD;  [facility];  Service: Radiology;  Laterality: N/A;  . TRACHEOSTOMY TUBE PLACEMENT N/A 12/15/2016   Procedure: TRACHEOSTOMY;  Surgeon: Serena Colonelosen, Jefry, MD;  [facility];  Service: ENT;  Laterality: N/A;   HPI: Pt is a 36 y.o.maleadmitted 5/7 with SAH secondary to aneurysm, s/p clipping 5/8 and complicated by vasospasm. He became hypoxic requiring intubation 5/8 and trach 5/2. PMH includes mordbid obesity and  varicose veins.  Subjective: pt alert, not spontaneously very communicative but more responsive to questions   Assessment / Plan / Recommendation  CHL IP CLINICAL IMPRESSIONS 12/23/2016  Clinical Impression Pt demonstrates ongoing impairment of laryngeal closure due to bialteral ulcerations on the posterior 1/3 of vocal folds following prolonged intubation. Pt seen to have at least trace frank penetration to the cords with nectar and thin liquids. Pt followed commands slightly better today, in respect to volitional throat clearing, but would not cough hard to expectorate and test for significant aspiration, so quantity of penetrate/aspirate could not be fully assessed. Sensation obviously impaired as pt made no involuntary response to frank penetration. Recommend pt initiate a conservative diet of dys 3 (mecahnical soft) diet with honey thick liquids. Suspect when there is increased flow of air to upper airway with smaller trach pt will find volitional throat clearing, coughing and PMSV use more tolerable. Will f/u for tolerance and upgraded trials with cueing as able.   SLP Visit Diagnosis Dysphagia, oropharyngeal phase (R13.12)  Attention and concentration deficit following --  Frontal lobe and executive function deficit following --  Impact on safety and function Moderate aspiration risk      CHL IP TREATMENT RECOMMENDATION 12/23/2016  Treatment Recommendations Therapy as outlined in treatment plan below     Prognosis 12/23/2016  Prognosis for Safe Diet Advancement Fair  Barriers to Reach Goals Motivation  Barriers/Prognosis Comment --    CHL IP DIET RECOMMENDATION 12/23/2016  SLP Diet Recommendations Dysphagia 3 (Mech soft) solids;Honey thick liquids  Liquid Administration via Cup  Medication Administration Whole meds with puree  Compensations Slow rate;Small sips/bites;Clear throat intermittently  Postural Changes Remain semi-upright after after feeds/meals (Comment)      CHL IP OTHER  RECOMMENDATIONS 12/23/2016  Recommended Consults --  Oral Care Recommendations Oral care BID  Other Recommendations Place PMSV during PO intake      CHL IP FOLLOW UP RECOMMENDATIONS 12/23/2016  Follow up Recommendations Skilled Nursing facility      [facility] IP FREQUENCY AND DURATION 12/23/2016  Speech Therapy Frequency (ACUTE ONLY) min 2x/week  Treatment Duration 2 weeks           CHL IP ORAL PHASE 12/23/2016  Oral Phase WFL  Oral - Pudding Teaspoon --  Oral - Pudding Cup --  Oral - Honey Teaspoon --  Oral - Honey Cup --  Oral - Nectar Teaspoon --  Oral - Nectar Cup --  Oral - Nectar Straw --  Oral - Thin Teaspoon --  Oral - Thin Cup --  Oral - Thin Straw --  Oral - Puree --  Oral - Mech Soft --  Oral - Regular --  Oral - Multi-Consistency --  Oral - Pill --  Oral Phase - Comment --    CHL IP PHARYNGEAL PHASE 12/23/2016  Pharyngeal Phase Impaired  Pharyngeal- Pudding Teaspoon --  Pharyngeal --  Pharyngeal- Pudding Cup --  Pharyngeal --  Pharyngeal- Honey Teaspoon NT  Pharyngeal --  Pharyngeal- Honey Cup WFL  Pharyngeal --  Pharyngeal- Nectar Teaspoon NT  Pharyngeal --  Pharyngeal- Nectar Cup NT  Pharyngeal --  Pharyngeal- Nectar Straw Penetration/Aspiration during swallow;Reduced airway/laryngeal closure  Pharyngeal --  Pharyngeal- Thin Teaspoon NT  Pharyngeal --  Pharyngeal- Thin Cup NT  Pharyngeal --  Pharyngeal- Thin Straw Penetration/Aspiration during swallow  Pharyngeal --  Pharyngeal- Puree WFL  Pharyngeal --  Pharyngeal- Mechanical Soft --  Pharyngeal --  Pharyngeal- Regular --  Pharyngeal --  Pharyngeal- Multi-consistency --  Pharyngeal --  Pharyngeal- Pill --  Pharyngeal --  Pharyngeal Comment --     No flowsheet data found.  No flowsheet data found.  Sharada Albornoz, [name] 12/23/2016, 10:12 AM

## 2016-12-23 NOTE — Progress Notes (Signed)
Physical Therapy Treatment Patient Details Name: Bryan KaysDarren D Macinnes MRN: [id number] DOB: July 19, 1981 Today's Date: 12/23/2016    History of Present Illness Patient is a 36 y/o male admitted 12/01/16 with sudden onset of headache. CTA-diffuse SAH over both convexities and extending into the basal cisterns. 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    PT Comments    Patient continues to make gradual progress with mobility and required less assist for bed mobility and able to sit unsupported EOB with single UE support. Pt with temp >100 and not feeling well today. PT/OT plan to see pt tomorrow to further assess cognition and functional mobility.    Follow Up Recommendations  CIR     Equipment Recommendations  Other (comment) (TBD next venue)    Recommendations for Other Services Rehab consult     Precautions / Restrictions Precautions Precautions: Fall Restrictions Weight Bearing Restrictions: No    Mobility  Bed Mobility Overal bed mobility: Needs Assistance Bed Mobility: Supine to Sit;Sit to Supine     Supine to sit: HOB elevated;Min guard Sit to supine: Min guard   General bed mobility comments: min guard for safety; use of bed rail and increased time and effort; max A +2 to reposition in bed with cues for sequencing and use of bed rails to assist in scooting up in bed  Transfers                    Ambulation/Gait                 Stairs            Wheelchair Mobility    Modified Rankin (Stroke Patients Only)       Balance Overall balance assessment: Needs assistance Sitting-balance support: Feet supported Sitting balance-Leahy Scale: Good                                      Cognition  Arousal/Alertness: Awake/alert Behavior During Therapy: Flat affect Overall Cognitive Status: Difficult to assess                                        Exercises      General Comments        Pertinent Vitals/Pain Pain Assessment: No/denies pain    Home Living                      Prior Function            PT Goals (current goals can now be found in the care plan section) Acute Rehab PT Goals Patient Stated Goal: none stated Progress towards PT goals: Progressing toward goals    Frequency    Min 4X/week      PT Plan Current plan remains appropriate    Co-evaluation              AM-PAC PT "6 Clicks" Daily Activity  Outcome Measure  Difficulty turning over in bed (including adjusting bedclothes, sheets and blankets)?: Total Difficulty moving from lying on back to sitting on the side of the bed? : Total Difficulty  sitting down on and standing up from a chair with arms (e.g., wheelchair, bedside commode, etc,.)?: Total Help needed moving to and from a bed to chair (including a wheelchair)?: A Little Help needed walking in hospital room?: A Lot Help needed climbing 3-5 steps with a railing? : A Lot 6 Click Score: 10    End of Session Equipment Utilized During Treatment: Oxygen Activity Tolerance: Patient tolerated treatment well Patient left: in bed;with call bell/phone within reach Nurse Communication: Mobility status;Need for lift equipment PT Visit Diagnosis: Muscle weakness (generalized) (M62.81);Other symptoms and signs involving the nervous system (R29.898)     Time: 1610-9604 PT Time Calculation (min) (ACUTE ONLY): 28 min  Charges:  $Therapeutic Activity: 23-37 mins                    G Codes:       Erline Levine, PTA Pager: [phone]     Carolynne Edouard 12/23/2016, 2:22 PM

## 2016-12-23 NOTE — Progress Notes (Signed)
Inpatient Rehabilitation  HardinKellyn [name], PTA and I discussed pt.'s case.  She is concerned that pt. Is participating minimally.  I saw patient at the bedside.  He was sleeping and very reluctant to awaken.  I spoke with his nurse Bryan [name].  We both saw him and finally got him awakened enough to interact.  I explained about CIR and the participation level that would be expected of him.  He initially indicated he did not want to come to rehab.  I educated him on the benefits of CIR vs. another post acute venue of care.  He later indicated he does want CIR.  I told pt. that [employer] would be unlikely to approve for CIR unless he gives his maximal effort and participation.  I further discussed case with Bryan [name], OT who is scheduled to see him this afternoon.  I await therapy notes demonstrating his ability to participate before initiating insurance authorization.  Of note, pt's temp just entered at 101.9 F.  Please call if questions.  Bryan [name] PT Inpatient Rehab Admissions Coordinator Cell [phone] Office [phone]

## 2016-12-24 ENCOUNTER — Inpatient Hospital Stay (HOSPITAL_COMMUNITY): Payer: BLUE CROSS/BLUE SHIELD

## 2016-12-24 LAB — URINALYSIS, MICROSCOPIC (REFLEX)

## 2016-12-24 LAB — GLUCOSE, CAPILLARY
GLUCOSE-CAPILLARY: 129 mg/dL — AB (ref 65–99)
Glucose-Capillary: 185 mg/dL — ABNORMAL HIGH (ref 65–99)
Glucose-Capillary: 126 mg/dL — ABNORMAL HIGH (ref 65–99)
Glucose-Capillary: 146 mg/dL — ABNORMAL HIGH (ref 65–99)
Glucose-Capillary: 131 mg/dL — ABNORMAL HIGH (ref 65–99)

## 2016-12-24 LAB — URINALYSIS, ROUTINE W REFLEX MICROSCOPIC
Bilirubin Urine: NEGATIVE
GLUCOSE, UA: NEGATIVE mg/dL
KETONES UR: NEGATIVE mg/dL
Nitrite: POSITIVE — AB
PROTEIN: 100 mg/dL — AB
Specific Gravity, Urine: 1.025 (ref 1.005–1.030)
pH: 6.5 (ref 5.0–8.0)

## 2016-12-24 LAB — CBC
HCT: 33.4 % — ABNORMAL LOW (ref 39.0–52.0)
Hemoglobin: 10.1 g/dL — ABNORMAL LOW (ref 13.0–17.0)
MCH: 27.2 pg (ref 26.0–34.0)
MCHC: 30.2 g/dL (ref 30.0–36.0)
MCV: 89.8 fL (ref 78.0–100.0)
PLATELETS: 404 10*3/uL — AB (ref 150–400)
RBC: 3.72 MIL/uL — ABNORMAL LOW (ref 4.22–5.81)
RDW: 15.3 % (ref 11.5–15.5)
WBC: 12.9 10*3/uL — AB (ref 4.0–10.5)

## 2016-12-24 MED ORDER — PRO-STAT SUGAR FREE PO LIQD
30.0000 mL | Freq: Two times a day (BID) | ORAL | Status: DC
  Administered 2016-12-25 – 2016-12-26 (×3): 30 mL via ORAL
  Filled 2016-12-24 (×3): qty 30

## 2016-12-24 NOTE — Progress Notes (Signed)
Foley catheter removed. Tip of foley intact. Pt tolerated well. Will monitor for urinary output.

## 2016-12-24 NOTE — Progress Notes (Signed)
Patient transferred to 3M10

## 2016-12-24 NOTE — Progress Notes (Signed)
Occupational Therapy Progress Note  Pt up in chair most of day.   Assisted nsg with transfer back to bed and with peri care.  He required mod A +2 for sit to stand and minA +2 for transfer back to bed.  Total A for toileting.   Recommend CIR.    12/24/16 1600  OT Visit Information  Last OT Received On 12/24/16  Assistance Needed +2  History of Present Illness Patient is a 36 y/o male admitted 12/01/16 with sudden onset of headache. CTA-diffuse SAH over both convexities and extending into the basal cisterns. 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  Precautions  Precautions Fall  Precaution Comments MAP goal 70; trach   Pain Assessment  Pain Assessment Faces  Faces Pain Scale 0  Cognition  Arousal/Alertness Awake/alert  Behavior During Therapy Flat affect  Overall Cognitive Status Impaired/Different from baseline  Area of Impairment Attention;Following commands;Safety/judgement;Awareness;Problem solving  Current Attention Level Sustained  Memory Decreased short-term memory  Following Commands Follows one step commands consistently;Follows multi-step commands inconsistently  Safety/Judgement Decreased awareness of safety;Decreased awareness of deficits  Awareness Intellectual  Problem Solving Slow processing;Decreased initiation;Difficulty sequencing;Requires verbal cues;Requires tactile cues  ADL  Overall ADL's  Needs assistance/impaired  Toilet Transfer Moderate assistance;+2 for physical assistance;Stand-pivot;BSC;RW  Toilet Transfer Details (indicate cue type and reason) Pt fatigued.  Required mod A +2 to move sit to stand (has been in chair all day).  He was incontinent of stool   Toileting- Clothing Manipulation and Hygiene Total  assistance;Sit to/from stand  Toileting - Clothing Manipulation Details (indicate cue type and reason) Pt stood x 5 mins for peri care due to bowel incontinence   Functional mobility during ADLs Moderate assistance;+2 for physical assistance  Bed Mobility  Overal bed mobility Needs Assistance  Bed Mobility Sit to Supine  Sit to supine Min assist  General bed mobility comments min A for LEs   Balance  Overall balance assessment Needs assistance  Sitting-balance support Feet supported  Sitting balance-Leahy Scale Fair  Standing balance support Bilateral upper extremity supported  Standing balance-Leahy Scale Poor  Standing balance comment Pt requires UE support and min A for static standing   Transfers  Overall transfer level Needs assistance  Equipment used 4-wheeled walker (EVA walker )  Transfers Sit to/from [employer] Transfers  Sit to Stand Mod assist;+2 physical assistance  Stand pivot transfers Min assist;+2 physical assistance  General transfer comment assist to lift up into standing and assist for balance   General Comments  General comments (skin integrity, edema, etc.) VSS  OT - End of Session  Equipment Utilized During Treatment Oxygen  Activity Tolerance Patient tolerated treatment well  Patient left with call bell/phone within reach;in bed;with nursing/sitter in room  Nurse Communication Mobility status  OT Assessment/Plan  OT Plan Discharge plan remains appropriate  AM-PAC OT "6 Clicks" Daily Activity Outcome Measure  Help from another person eating meals? 3  Help from another person taking care of personal grooming? 3  Help from another person toileting, which includes using toliet, bedpan, or urinal? 2  Help from another person bathing (including washing, rinsing, drying)? 2  Help from another person to put on and taking off regular upper body clothing? 2  Help from another person to put on and taking off regular  lower body clothing? 1  6 Click Score 13   ADL G Code Conversion CL  OT Goal Progression  Progress towards OT goals Progressing toward goals  OT Time Calculation  OT Start Time (ACUTE ONLY) 1525  OT Stop Time (ACUTE ONLY) 1541  OT Time Calculation (min) 16 min  OT General Charges  $OT Visit 1 Procedure  OT Treatments  $Self Care/Home Management  8-22 mins  Reynolds AmericanWendi Krystine Pabst, OTR/L [phone]

## 2016-12-24 NOTE — Progress Notes (Signed)
No issues overnight. Pt appears well this am, no complaints. Was febrile to 103 yesterday, has had increased secretions.  EXAM:  BP 125/81   Pulse 91   Temp 98.8 F (37.1 C) (Oral)   Resp 17   Ht 5\' 11"  (1.803 m)   Wt (!) 183.3 kg (404 lb)   SpO2 100%   BMI 56.35 kg/m   Awake, alert, oriented  Speech fluent, appropriate  CN grossly intact  5/5 BUE/BLE   IMPRESSION:  36 y.o. male s/p clipping left Pcom. Neurologically stable. Fever with increased secretions yesterday.  PLAN: - Will check CBC, UA, blood cx, CXR - Cont PT/OT

## 2016-12-24 NOTE — Progress Notes (Signed)
Nutrition Follow Up  DOCUMENTATION CODES:   Morbid obesity  INTERVENTION:    Prostat liquid protein po 30 ml BID with meals, each supplement provides 100 kcal, 15 grams protein  NEW NUTRITION DIAGNOSIS:   Increased nutrient needs related to acute illness as evidenced by estimated needs, ongoing  NEW GOAL:   Patient will meet greater than or equal to 90% of their needs, progressing  MONITOR:   PO intake, Supplement acceptance, Labs, Weight trends, Skin, I & O's  ASSESSMENT:   Pt with PMH of morbid obesity and varicose veins admitted with sudden HA. CTA demonstrated diffuse SAH over both convexities and extending into the basal cisterns. 5/8 pt with worsening hypoxemia and increased WOB and intubated.   5/21 trach placed  S/p FEES 5/25.  Cortrak tube out. Advanced to Dys 3-honey thick liqiuds. PO intake good at 75-100% per flowsheet records. Medications reviewed and include Imodium and Creon. Labs reviewed.  CBG's [id number].  Diet Order:  DIET DYS 3 Room service appropriate? Yes; Fluid consistency: Honey Thick   Skin:  Reviewed, no issues  Last BM:  5/29  Height:   Ht Readings from Last 1 Encounters:  12/02/16 5\' 11"  (1.803 m)   Weight:   Wt Readings from Last 1 Encounters:  12/24/16 (!) 404 lb (183.3 kg)   Ideal Body Weight:  78.1 kg  BMI:  Body mass index is 56.35 kg/m.  Estimated Nutritional Needs:   Kcal:  1800-2000  Protein:  115-125 gm  Fluid:  1.8-2.0 L  EDUCATION NEEDS:   No education needs identified at this time  Maureen ChattersKatie Lynwood Kubisiak, RD, LDN Pager #: [phone] After-Hours Pager #: [phone]

## 2016-12-24 NOTE — Progress Notes (Signed)
RN asked me to remove trach sutures at this time. Patient tolerated well. Trach secured with trach ties

## 2016-12-24 NOTE — Progress Notes (Signed)
Inpatient Rehabilitation  Pt. able to participate in therapies today, on D3 diet.   Will initiate insurance authorization process today in hopes of potential admission later in week if [employer] approves IP Rehab.  Plan for Fae PippinMelissa Bowie to follow up tomorrow.  Please call if questions.  Weldon PickingSusan Alvena Kiernan PT Inpatient Rehab Admissions Coordinator Cell [phone] Office [phone]

## 2016-12-24 NOTE — Progress Notes (Signed)
Occupational Therapy Treatment Patient Details Name: Bryan [name] MRN: [id number] DOB: 1981-04-01 Today's Date: 12/24/2016    History of present illness Patient is a 36 y/o male admitted 12/01/16 with sudden onset of headache. CTA-diffuse SAH over both convexities and extending into the basal cisterns. 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   OT comments  Pt much more alert and interactive  today, even smiling.  He reports he is ready to get better and go home.  He requires min A +2 for functional transfers.  Mobility limited by pain generated gripper socks - he is very internally distracted by pain and discomfort becoming agitated with his mother - per mother this is not normal behaviors.   He demonstrates selective attention today, but requires cues for sequencing and safety.  He requires mod to max A, overall for ADLs.  Feel he will benefit from the intensity and consistency of CIR, as well as from team environment to address cognitive/behavioral deficits.  Will follow acutely.    Follow Up Recommendations  CIR    Equipment Recommendations  3 in 1 bedside commode    Recommendations for Other Services Rehab consult    Precautions / Restrictions Precautions Precautions: Fall Precaution Comments: MAP goal 70; trach        Mobility Bed Mobility Overal bed mobility: Needs Assistance Bed Mobility: Supine to Sit     Supine to sit: Min assist;+2 for physical assistance;HOB elevated     General bed mobility comments: With [facility] elevelated, pt requires assist to lift trunk and rotate hips   Transfers Overall transfer level: Needs assistance Equipment used: 2 person hand held assist Transfers: Sit to/from [employer] Sit to  Stand: Min assist;+2 physical assistance Stand pivot transfers: Min assist;+2 physical assistance       General transfer comment: Pt requires assist to lift buttocks from bed and for balance when turning.  Requires mod verbal cues for sequencing     Balance Overall balance assessment: Needs assistance Sitting-balance support: Feet supported Sitting balance-Leahy Scale: Fair Sitting balance - Comments: Pt able to lean forward on EOC to try to access feet to try to remove socks with unilateral UE support     Standing balance support: Bilateral upper extremity supported Standing balance-Leahy Scale: Poor Standing balance comment: Pt requires UE support and min A for static standing                            ADL either performed or assessed with clinical judgement   ADL Overall ADL's : Needs assistance/impaired Eating/Feeding: NPO Eating/Feeding Details (indicate cue type and reason): Pt eager to progress to normal diet  Grooming: Minimal assistance;Sitting;Wash/dry face;Oral care   Upper Body Bathing: Moderate assistance;Sitting   Lower Body Bathing: Maximal assistance;+2 for safety/equipment;Sit to/from stand   Upper Body Dressing : Maximal assistance;Sitting   Lower Body Dressing: Total assistance;Sit to/from stand Lower Body Dressing Details (indicate cue type and reason): Pt able to lean forward to mid calf in attempts to doff socks  Toilet Transfer: Minimal assistance;+2 for physical assistance;Stand-pivot;BSC   Toileting- Clothing Manipulation and Hygiene: Total assistance;Sit to/from stand       Functional mobility during ADLs: Minimal assistance;+2 for physical assistance (stand pivot transfer onlcy )  Vision       Perception     Praxis      Cognition Arousal/Alertness: Awake/alert Behavior During Therapy: WFL for tasks assessed/performed;Agitated Overall Cognitive Status: Impaired/Different from baseline Area of Impairment:  Attention;Following commands;Safety/judgement;Awareness;Problem solving                 Orientation Level: Disoriented to;Time Current Attention Level: Sustained Memory: Decreased short-term memory Following Commands: Follows one step commands consistently;Follows multi-step commands inconsistently Safety/Judgement: Decreased awareness of safety;Decreased awareness of deficits Awareness: Intellectual Problem Solving: Slow processing;Decreased initiation;Difficulty sequencing;Requires verbal cues;Requires tactile cues General Comments: PMV in place.   Pt more interactive today, and smiling at times.  Became agitated by pain in feet caused by socks.  Initially sat refusing to progress to OOB, but did so with encouragement.  However, as discomfort and pain increased again with standing, he became agitated with his mother pushing her.         Exercises     Shoulder Instructions       General Comments VSS with pt on trach collar and 28% FI02 with PMV in place     Pertinent Vitals/ Pain       Pain Assessment: Faces Faces Pain Scale: Hurts whole lot Pain Location: bil. feet due to grips on the socks  Pain Descriptors / Indicators: Aching;Discomfort;Guarding;Grimacing;Tender Pain Intervention(s): Limited activity within patient's tolerance;Other (comment) (removed socks once pt seated, and mother will bring in shoes)  Home Living                                          Prior Functioning/Environment              Frequency  Min 2X/week        Progress Toward Goals  OT Goals(current goals can now be found in the care plan section)  Progress towards OT goals: Progressing toward goals     Plan Discharge plan remains appropriate    Co-evaluation    PT/OT/SLP Co-Evaluation/Treatment: Yes Reason for Co-Treatment: Complexity of the patient's impairments (multi-system involvement);For patient/therapist safety;To address functional/ADL transfers   OT  goals addressed during session: ADL's and self-care      AM-PAC PT "6 Clicks" Daily Activity     Outcome Measure   Help from another person eating meals?: A Little Help from another person taking care of personal grooming?: A Little Help from another person toileting, which includes using toliet, bedpan, or urinal?: A Lot Help from another person bathing (including washing, rinsing, drying)?: A Lot Help from another person to put on and taking off regular upper body clothing?: A Lot Help from another person to put on and taking off regular lower body clothing?: Total 6 Click Score: 13    End of Session Equipment Utilized During Treatment: Oxygen  OT Visit Diagnosis: Muscle weakness (generalized) (M62.81);Cognitive communication deficit (R41.841) Symptoms and signs involving cognitive functions: Nontraumatic SAH   Activity Tolerance Patient limited by pain   Patient Left in chair;with call bell/phone within reach;with chair alarm set;with family/visitor present;with nursing/sitter in room   Nurse Communication Mobility status        Time: [id number] OT Time Calculation (min): 25 min  Charges: OT General Charges $OT Visit: 1 Procedure OT Treatments $Therapeutic Activity: 8-22 mins  Reyn[employer] AmericanWendi Janyia Guion, OTR/L [phone]    Jeani HawkingConarpe, Raunak Antuna M 12/24/2016, 12:53 PM

## 2016-12-24 NOTE — Progress Notes (Signed)
  Speech Language Pathology Treatment: Dysphagia;Passy Muir Speaking valve  Patient Details Name: Bryan [name] MRN: [id number] DOB: Jun 01, 1981 Today's Date: 12/24/2016 Time: [id number] SLP Time Calculation (min) (ACUTE ONLY): 15 min  Assessment / Plan / Recommendation Clinical Impression  Pt maximally participatory today, repositioned himself in the bed for meal and Po trials with SLP. Accepted sips of honey thick liquids without resistance, self fed with no signs of aspiration.  Communicating at phrase level with min verbal cues for increased breath support for volume.  Pt able to place and remove PMSV with min verbal cues and verbalize precautions. Given diet initiation and tolerance of PMSV will allow use of valve with intermittent supervision during all waking hours. Pt to remove valve for sleep. Needs trach downsize.   HPI HPI: Pt is a 36 y.o.maleadmitted 5/7 with SAH secondary to aneurysm, s/p clipping 5/8 and complicated by vasospasm. He became hypoxic requiring intubation 5/8 and trach 5/2. PMH includes mordbid obesity and varicose veins.      SLP Plan  Continue with current plan of care       Recommendations  Diet recommendations: Dysphagia 3 (mechanical soft);Honey-thick liquid Liquids provided via: Straw;Cup Medication Administration: Whole meds with puree Supervision: Full supervision/cueing for compensatory strategies Compensations: Slow rate;Small sips/bites;Clear throat intermittently      Patient may use Passy-Muir Speech Valve: During all therapies with supervision;During all waking hours (remove during sleep);During PO intake/meals PMSV Supervision: Intermittent MD: Please consider changing trach tube to : Smaller size;Cuffless         Plan: Continue with current plan of care       GO               [facility] Township Surgery CenterBonnie Ione [place], MA CCC-SLP [phone]  Bryan [name], Bryan [name] Bryan [name] 12/24/2016, 10:28 AM

## 2016-12-24 NOTE — Progress Notes (Signed)
Physical Therapy Treatment Patient Details Name: Bryan [name] MRN: [id number] DOB: [birth year] Today's Date: 12/24/2016    History of Present Illness Patient is a 36 y/o male admitted 12/01/16 with sudden onset of headache. CTA-diffuse SAH over both convexities and extending into the basal cisterns. 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    PT Comments    Patient more talkative and appeared in better spirits today. Pt reported feeling better today. Pt able to transfer EOB to recliner with min A +2. Limited mobility due to pain in bilat feet with gripper socks donned--mother will bring shoes. VSS with mobility. Continue to recommend CIR for further skilled PT services to address mobility and cognitive deficits and increase independence.   Follow Up Recommendations  CIR     Equipment Recommendations  Other (comment) (TBD next venue)    Recommendations for Other Services Rehab consult     Precautions / Restrictions Precautions Precautions: Fall Precaution Comments: MAP goal 70; trach  Restrictions Weight Bearing Restrictions: No    Mobility  Bed Mobility Overal bed mobility: Needs Assistance Bed Mobility: Supine to Sit     Supine to sit: Min assist;+2 for physical assistance;HOB elevated     General bed mobility comments: assist to elevate trunk into sitting with use of rails and HOB elevated  Transfers Overall transfer level: Needs assistance Equipment used: 2 person hand held assist Transfers: Sit to/from [employer] Sit to Stand: Min assist;+2 physical assistance Stand pivot transfers: Min assist;+2 physical assistance       General transfer comment: assist to power up into standing and for balance with dynamic  activity; cues for sequencing and safe hand placement  Ambulation/Gait             General Gait Details: attempt when pt has shoes   Stairs            Wheelchair Mobility    Modified Rankin (Stroke Patients Only)       Balance Overall balance assessment: Needs assistance Sitting-balance support: Feet supported Sitting balance-Leahy Scale: Good Sitting balance - Comments: Pt able to lean forward on EOC to try to access feet to try to remove socks with unilateral UE support     Standing balance support: Bilateral upper extremity supported Standing balance-Leahy Scale: Poor Standing balance comment: Pt requires UE support and min A for static standing                             Cognition Arousal/Alertness: Awake/alert Behavior During Therapy: Flat affect Overall Cognitive Status: Impaired/Different from baseline Area of Impairment: Attention;Following commands;Safety/judgement;Awareness;Problem solving                 Orientation Level: Disoriented to;Time Current Attention Level: Sustained Memory: Decreased short-term memory Following Commands: Follows one step commands consistently;Follows multi-step commands inconsistently Safety/Judgement: Decreased awareness of safety;Decreased awareness of deficits Awareness: Intellectual Problem Solving: Slow processing;Decreased initiation;Difficulty sequencing;Requires verbal cues;Requires tactile cues General Comments: PMV in place.   Pt more interactive today, and smiling at times.  Became agitated by pain in feet caused by socks.  Initially sat refusing to progress to OOB, but did so with encouragement.  However, as discomfort and pain increased again with standing, he became agitated with his  mother pushing her.       Exercises      General Comments General comments (skin integrity, edema, etc.): VSS; pt on 28% FIO2 via trach collar and with PMV       Pertinent Vitals/Pain Pain Assessment:  Faces Faces Pain Scale: Hurts whole lot Pain Location: bil. feet due to grips on the socks  Pain Descriptors / Indicators: Aching;Discomfort;Guarding;Grimacing;Tender Pain Intervention(s): Limited activity within patient's tolerance;Other (comment) (socks removed once seated-mother will bring shoes)    Home Living                      Prior Function            PT Goals (current goals can now be found in the care plan section) Acute Rehab PT Goals Patient Stated Goal: none stated Progress towards PT goals: Progressing toward goals    Frequency    Min 4X/week      PT Plan Current plan remains appropriate    Co-evaluation PT/OT/SLP Co-Evaluation/Treatment: Yes Reason for Co-Treatment: Complexity of the patient's impairments (multi-system involvement);For patient/therapist safety;To address functional/ADL transfers PT goals addressed during session: Mobility/safety with mobility OT goals addressed during session: ADL's and self-care      AM-PAC PT "6 Clicks" Daily Activity  Outcome Measure  Difficulty turning over in bed (including adjusting bedclothes, sheets and blankets)?: Total Difficulty moving from lying on back to sitting on the side of the bed? : Total Difficulty sitting down on and standing up from a chair with arms (e.g., wheelchair, bedside commode, etc,.)?: Total Help needed moving to and from a bed to chair (including a wheelchair)?: A Little Help needed walking in hospital room?: A Lot Help needed climbing 3-5 steps with a railing? : A Lot 6 Click Score: 10    End of Session Equipment Utilized During Treatment: Oxygen Activity Tolerance: Patient tolerated treatment well Patient left: with call bell/phone within reach;in chair;with chair alarm set Nurse Communication: Mobility status PT Visit Diagnosis: Muscle weakness (generalized) (M62.81);Other symptoms and signs involving the nervous system (R29.898)     Time: 1130-1210 PT Time Calculation  (min) (ACUTE ONLY): 40 min  Charges:  $Therapeutic Activity: 23-37 mins                    G Codes:       Erline LevineKellyn Nation Cradle, PTA Pager: [phone]     Carolynne EdouardKellyn R Samuell Knoble 12/24/2016, 1:21 PM

## 2016-12-24 NOTE — Progress Notes (Signed)
Inpatient Rehabilitation  Pt. is awake this am and speaking with me.  He says he wants IP rehab.  Will await  PT/OT/ST sessions today  to see if he is able to tolerate more activity before initiating insurance authorization for [facility].    Are there current plans to downsize trach to #6 XLT anytime soon?     Please call if questions.  Weldon PickingSusan Ronney [employer] PT Inpatient Rehab Admissions Coordinator Cell [phone] Office [phone]

## 2016-12-25 ENCOUNTER — Inpatient Hospital Stay (HOSPITAL_COMMUNITY): Payer: BLUE CROSS/BLUE SHIELD | Admitting: Physical Therapy

## 2016-12-25 ENCOUNTER — Inpatient Hospital Stay (HOSPITAL_COMMUNITY): Payer: BLUE CROSS/BLUE SHIELD | Admitting: Occupational Therapy

## 2016-12-25 DIAGNOSIS — L8990 Pressure ulcer of unspecified site, unspecified stage: Secondary | ICD-10-CM | POA: Insufficient documentation

## 2016-12-25 LAB — GLUCOSE, CAPILLARY
GLUCOSE-CAPILLARY: 104 mg/dL — AB (ref 65–99)
GLUCOSE-CAPILLARY: 112 mg/dL — AB (ref 65–99)
GLUCOSE-CAPILLARY: 115 mg/dL — AB (ref 65–99)
GLUCOSE-CAPILLARY: 122 mg/dL — AB (ref 65–99)
Glucose-Capillary: 95 mg/dL (ref 65–99)
Glucose-Capillary: 126 mg/dL — ABNORMAL HIGH (ref 65–99)

## 2016-12-25 LAB — MRSA PCR SCREENING: MRSA BY PCR: POSITIVE — AB

## 2016-12-25 MED ORDER — CHLORHEXIDINE GLUCONATE CLOTH 2 % EX PADS
6.0000 | MEDICATED_PAD | Freq: Every day | CUTANEOUS | Status: DC
  Administered 2016-12-26: 6 via TOPICAL

## 2016-12-25 MED ORDER — MUPIROCIN 2 % EX OINT
1.0000 "application " | TOPICAL_OINTMENT | Freq: Two times a day (BID) | CUTANEOUS | Status: DC
  Administered 2016-12-25 – 2016-12-26 (×2): 1 via NASAL
  Filled 2016-12-25: qty 22

## 2016-12-25 MED ORDER — SULFAMETHOXAZOLE-TRIMETHOPRIM 800-160 MG PO TABS
1.0000 | ORAL_TABLET | Freq: Two times a day (BID) | ORAL | Status: DC
  Administered 2016-12-25 – 2016-12-26 (×2): 1 via ORAL
  Filled 2016-12-25 (×3): qty 1

## 2016-12-25 NOTE — H&P (Signed)
Physical Medicine and Rehabilitation Admission H&P    Chief Complaint  Patient presents with  . Headache  : HPI: Bryan [name] is a 36 y.o. right handed male with morbid obesity on no prescription medications. Per chart review patient lives with parent independent prior to admission. Presented 12/01/2016 with sudden onset of headache. No recent fall or trauma reported. No history of seizures. 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 Patient developed worsening hypoxemia increasing shortness of breath requiring intubation. 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 Conchita Paris. Hospital course requiring tracheostomy 12/15/2016 per Dr. Pollyann Kennedy and speech therapy follow-up with [facility]. Findings of acute DVT left popliteal vein as well as left posterior tibial veins and underwent placement of IVC filter per interventional radiology05/17/2018.  Tube feeds for nutritional support with diet advanced to dysphagia #3 honey thick liquids.MRSA PCR screen positive placed on contact precautions. Urine study with many bacteria, nitrite negative placed on Bactrim empirically. Physical and occupational therapy evaluations completed 12/16/2016 with recommendations of physical medicine rehabilitation consult.Patient was admitted for a comprehensive rehabilitation program  Review of Systems  Constitutional: Negative for chills and fever.  HENT: Negative for hearing loss.   Eyes: Negative for blurred vision and double vision.  Respiratory: Positive for cough and shortness of breath.   Cardiovascular: Positive for leg swelling. Negative for chest pain and palpitations.  Gastrointestinal:  Positive for constipation. Negative for nausea and vomiting.  Genitourinary: Negative for dysuria, flank pain and hematuria.  Musculoskeletal: Positive for myalgias.  Skin: Negative for rash.  Neurological: Positive for dizziness and weakness.  All other systems reviewed and are negative.  Past Medical History:  Diagnosis Date  . Complication of anesthesia    mom states they almost lost him but not sure if he was given to much medication  . Varicose veins    mutilple bleeding episodes, bilateral lower extremities)   Past Surgical History:  Procedure Laterality Date  . CRANIOTOMY Left 12/02/2016   Procedure: CRANIOTOMY FOR CLIPPING OF INTRACRANIAL ANEURYSM;  Surgeon: Lisbeth Renshaw, MD;  Location: [facility] OR;  Service: Neurosurgery;  Laterality: Left;  . IR FLUORO RM 30-60 MIN  12/02/2016  . IR IVC FILTER PLMT / S&I /IMG GUID/MOD SED  12/11/2016  . IR US GUIDE VASC ACCESS RIGHT  12/02/2016  . lipo around heart  at age 70   pt states its bc they could hear his  heart beat  . ORIF ANKLE FRACTURE Left 09/02/2012   Procedure: OPEN REDUCTION INTERNAL FIXATION (ORIF) ANKLE FRACTURE;  Surgeon: Valeria Batman, MD;  Location: [facility] OR;  Service: Orthopedics;  Laterality: Left;  OPEN REDUCTION INTERNAL FIXATION LEFT DISTAL FIBULA FRACTURE WITH REDUCTION FIXATION DISTAL TIBIA/FIBULAR DIASTASIS   . RADIOLOGY WITH ANESTHESIA N/A 12/02/2016   Procedure: RADIOLOGY WITH ANESTHESIA;  Surgeon: Lisbeth Renshaw, MD;  Location: [facility] OR;  Service: Radiology;  Laterality: N/A;  . TRACHEOSTOMY TUBE PLACEMENT N/A 12/15/2016   Procedure: TRACHEOSTOMY;  Surgeon: Serena Colonel, MD;  Location: [facility] OR;  Service: ENT;  Laterality: N/A;   No family history on file. Social History:  reports that he has never smoked. His smokeless tobacco use includes Chew. He reports that he drinks alcohol. He reports that he does not use drugs. Allergies:  Allergies  Allergen Reactions  . Penicillins Hives and Other (See Comments)    Mouth  blisters - childhood allergy Has patient had a PCN reaction causing immediate rash, facial/tongue/throat swelling, SOB or lightheadedness with hypotension: Yes Has patient had a PCN reaction causing severe rash involving mucus membranes or skin necrosis: No Has patient had a PCN reaction that required hospitalization Yes Has patient had a PCN reaction occurring within the last 10 years: No If all of the above answers are "NO", then may proceed with Cephalosporin use.   Medications Prior to Admission  Medication Sig Dispense Refill  . oxyCODONE-acetaminophen (PERCOCET/ROXICET) 5-325 MG per tablet Take 1-2 tablets by mouth every 4 (four) hours as needed. (Patient not taking: Reported on 01/12/2015) 60 tablet 0    Home: Home Living Family/patient expects to be discharged to:: Private residence Living Arrangements: Parent Available Help at Discharge: Family Home Access: Stairs to enter Entrance Stairs-Number of Steps: could not specify   Functional History: Prior Function Level of Independence: Independent Comments: Reports working PTA but difficult to get history due to trach.  Functional Status:  Mobility: Bed Mobility Overal bed mobility: Needs Assistance Bed Mobility: Sit to Supine Rolling: Max assist, +2 for physical assistance Supine to sit: Min assist, +2 for physical assistance, HOB elevated Sit to supine: Min assist General bed mobility comments: min A for LEs  Transfers Overall transfer level: Needs assistance Equipment used: 4-wheeled walker (EVA walker ) Transfers: Sit to/from Stand, [employer] Pivot Transfers Sit to Stand: Mod assist, +2 physical assistance Stand pivot transfers: Min assist, +2 physical assistance General transfer comment: assist to lift up into standing and assist for balance  Ambulation/Gait General Gait Details: attempt when pt has shoes    ADL: ADL Overall ADL's : Needs assistance/impaired Eating/Feeding: NPO Eating/Feeding Details (indicate  cue type and reason): Pt eager to progress to normal diet  Grooming: Minimal assistance, Sitting, Wash/dry face, Oral care Upper Body Bathing: Moderate assistance, Sitting Lower Body Bathing: Maximal assistance, +2 for safety/equipment, Sit to/from stand Upper Body Dressing : Maximal assistance, Sitting Lower Body Dressing: Total assistance, Sit to/from stand Lower Body Dressing Details (indicate cue type and reason): Pt able to lean forward to mid calf in attempts to doff socks  Toilet Transfer: Moderate assistance, +2 for physical assistance, Stand-pivot, BSC, RW Toilet Transfer Details (indicate cue type and reason): Pt fatigued.  Required mod A +2 to move sit to stand (has been in chair all day).  He was incontinent of stool  Toileting- Clothing Manipulation and Hygiene: Total assistance, Sit to/from stand Toileting - Clothing Manipulation Details (indicate cue type and reason): Pt stood x 5 mins for peri care due to bowel incontinence  Functional mobility during ADLs: Moderate assistance, +2 for physical assistance General ADL Comments: Pt would not engage in ADLs session.  Attempted to establish goals with pt, however, he would not engage stating, you need to stop asking me questions.  Pt's mother arrived.  She reports pt was independent PTA, lived with grandparents.  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   Pt does indicate he feels frightened by diagnosis, but when I attempted to delve or encourage him, he denied feeling this way.   Spoke with pt and established a plan for OOB tomorrow which he agreed to, however, unsure if he will recall this   Cognition: Cognition Overall Cognitive Status: Impaired/Different from baseline Arousal/Alertness: Awake/alert Orientation Level:  Intubated/Tracheostomy - Unable to assess Problem Solving: Impaired Problem Solving Impairment: Functional  basic Cognition Arousal/Alertness: Awake/alert Behavior During Therapy: Flat affect Overall Cognitive Status: Impaired/Different from baseline Area of Impairment: Attention, Following commands, Safety/judgement, Awareness, Problem solving Orientation Level: Disoriented to, Time Current Attention Level: Sustained Memory: Decreased short-term memory Following Commands: Follows one step commands consistently, Follows multi-step commands inconsistently Safety/Judgement: Decreased awareness of safety, Decreased awareness of deficits Awareness: Intellectual Problem Solving: Slow processing, Decreased initiation, Difficulty sequencing, Requires verbal cues, Requires tactile cues General Comments: PMV in place.   Pt more interactive today, and smiling at times.  Became agitated by pain in feet caused by socks.  Initially sat refusing to progress to OOB, but did so with encouragement.  However, as discomfort and pain increased again with standing, he became agitated with his mother pushing her.  Difficult to assess due to: Tracheostomy  Physical Exam: Blood pressure 108/69, pulse 90, temperature 98.5 F (36.9 C), temperature source Oral, resp. rate 16, height 5\' 11"  (1.803 m), weight (!) 189.1 kg (417 lb), SpO2 97 %. Physical Exam  Constitutional:  36 y/o right handed obese male.  HENT:  Head: Normocephalic.  Sclera less injected  Eyes: EOM are normal.  Left crani scar dry and intact  Neck:  Trach in place, #8 XL?Marland Kitchen Phonates well with PMV  Cardiovascular: Normal rate and regular rhythm.  Exam reveals no gallop and no friction rub.   No murmur heard. Respiratory: No respiratory distress. He has no wheezes. He has no rales.  Decreased breath sounds at the bases but clear to auscultation  GI: Soft. Bowel sounds are normal. He exhibits no distension.  Musculoskeletal: He exhibits no edema.  Skin. Clean and dry Neurological:  Patient makes eye contact with examiner. Improved attention and  processing.  Followed simple commands, improved oro-motor control. Moves all 4 limbs---strength 3-4/5 UE's and 2 HF, 3-KE and 3+ ADF/PF. senses pain in all 4. Emerging insight and awareness Psych :pleasant and cooperative.    Results for orders placed or performed during the hospital encounter of 12/01/16 (from the past 48 hour(s))  Glucose, capillary     Status: Abnormal   Collection Time: 12/23/16  9:40 AM  Result Value Ref Range   Glucose-Capillary 108 (H) 65 - 99 mg/dL  Glucose, capillary     Status: Abnormal   Collection Time: 12/23/16 12:21 PM  Result Value Ref Range   Glucose-Capillary 103 (H) 65 - 99 mg/dL  Glucose, capillary     Status: Abnormal   Collection Time: 12/23/16  4:38 PM  Result Value Ref Range   Glucose-Capillary 139 (H) 65 - 99 mg/dL  Glucose, capillary     Status: Abnormal   Collection Time: 12/23/16  8:54 PM  Result Value Ref Range   Glucose-Capillary 129 (H) 65 - 99 mg/dL  Glucose, capillary     Status: Abnormal   Collection Time: 12/24/16 12:01 AM  Result Value Ref Range   Glucose-Capillary 141 (H) 65 - 99 mg/dL  Glucose, capillary     Status: Abnormal   Collection Time: 12/24/16  4:13 AM  Result Value Ref Range   Glucose-Capillary 129 (H) 65 - 99 mg/dL  Glucose, capillary     Status: Abnormal   Collection Time: 12/24/16  8:09 AM  Result Value Ref Range   Glucose-Capillary 185 (H) 65 - 99 mg/dL  CBC     Status: Abnormal   Collection Time: 12/24/16 11:26 AM  Result Value Ref Range   WBC 12.9 (H) 4.0 -  10.5 K/uL   RBC 3.72 (L) 4.22 - 5.81 MIL/uL   Hemoglobin 10.1 (L) 13.0 - 17.0 g/dL   HCT 16.1 (L) 09.6 - 04.5 %   MCV 89.8 78.0 - 100.0 fL   MCH 27.2 26.0 - 34.0 pg   MCHC 30.2 30.0 - 36.0 g/dL   RDW 40.9 81.1 - 91.4 %   Platelets 404 (H) 150 - 400 K/uL  Glucose, capillary     Status: Abnormal   Collection Time: 12/24/16  1:14 PM  Result Value Ref Range   Glucose-Capillary 126 (H) 65 - 99 mg/dL  Urinalysis, Routine w reflex microscopic      Status: Abnormal   Collection Time: 12/24/16  3:41 PM  Result Value Ref Range   Color, Urine YELLOW YELLOW   APPearance TURBID (A) CLEAR   Specific Gravity, Urine 1.025 1.005 - 1.030   pH 6.5 5.0 - 8.0   Glucose, UA NEGATIVE NEGATIVE mg/dL   Hgb urine dipstick LARGE (A) NEGATIVE   Bilirubin Urine NEGATIVE NEGATIVE   Ketones, ur NEGATIVE NEGATIVE mg/dL   Protein, ur 782 (A) NEGATIVE mg/dL   Nitrite POSITIVE (A) NEGATIVE   Leukocytes, UA MODERATE (A) NEGATIVE  Urinalysis, Microscopic (reflex)     Status: Abnormal   Collection Time: 12/24/16  3:41 PM  Result Value Ref Range   RBC / HPF 6-30 0 - 5 RBC/hpf   WBC, UA 6-30 0 - 5 WBC/hpf   Bacteria, UA MANY (A) NONE SEEN   Squamous Epithelial / LPF 0-5 (A) NONE SEEN   Granular Casts, UA PRESENT    Urine-Other URIC ACID CRYSTALS   Glucose, capillary     Status: Abnormal   Collection Time: 12/24/16  3:44 PM  Result Value Ref Range   Glucose-Capillary 146 (H) 65 - 99 mg/dL  Glucose, capillary     Status: Abnormal   Collection Time: 12/24/16  7:54 PM  Result Value Ref Range   Glucose-Capillary 131 (H) 65 - 99 mg/dL   Comment 1 Notify RN    Comment 2 Document in Chart   Glucose, capillary     Status: Abnormal   Collection Time: 12/24/16 11:49 PM  Result Value Ref Range   Glucose-Capillary 104 (H) 65 - 99 mg/dL   Comment 1 Notify RN    Comment 2 Document in Chart   Glucose, capillary     Status: Abnormal   Collection Time: 12/25/16  4:08 AM  Result Value Ref Range   Glucose-Capillary 122 (H) 65 - 99 mg/dL   Comment 1 Notify RN    Dg Chest Port 1 View  Result Date: 12/24/2016 CLINICAL DATA:  Increased tracheal secretions.  Fever. EXAM: PORTABLE CHEST 1 VIEW COMPARISON:  Ten days prior FINDINGS: Tracheostomy tube remains well seated. Right upper extremity PICC with tip at the SVC level. Low volumes with interstitial crowding. There is no edema, consolidation, effusion, or pneumothorax. Chronic cardiomegaly. IMPRESSION: 1. Stable chest.   No evidence of pneumonia. 2. Cardiomegaly. Electronically Signed   By: Marnee Spring M.D.   On: 12/24/2016 09:58       Medical Problem List and Plan: 1.  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  DVT Prophylaxis/Anticoagulation: Left popliteal vein as well as left posterior tibial vein DVT. Status post IVC filter 12/11/2016 per interventional radiology 3. Pain Management: Tylenol as needed. Denies any pain or headache at present 4. Mood: Provide emotional support 5. Neuropsych:  This patient is capable of making decisions on his own behalf. 6. Skin/Wound Care: Routine skin checks 7. Fluids/Electrolytes/Nutrition: Routine I&O with follow-up chemistries  -encourage PO 8. Tracheostomy 12/15/2016. Speech therapy follow-up PMV. Presently with a #8 XLT in place  -phonating well. Probably can downsize on monday 9. Dysphagia. Dysphagia #3 honey thick liquids. Follow speech therapy. Monitor hydration while on honeys 10. Hypertension. Monitor with increased mobility 11. Morbid obesity. BMI 56.35 kg Dietary follow-up 12. MRSA PCR screen positive. Contact precautions 13.UTI. Empiric Bactrim  Post Admission Physician Evaluation: 1. Functional deficits secondary  to ruptured left p-com aneurysm. 2. Patient is admitted to receive collaborative, interdisciplinary care between the physiatrist, rehab nursing staff, and therapy team. 3. Patient's level of medical complexity and substantial therapy needs in context of that medical necessity cannot be provided at a lesser intensity of care such as a SNF. 4. Patient has experienced substantial functional loss from his/her baseline which was documented above under the "Functional History" and "Functional Status" headings.  Judging by the patient's diagnosis, physical exam, and functional history, the patient has potential for functional progress  which will result in measurable gains while on inpatient rehab.  These gains will be of substantial and practical use upon discharge  in facilitating mobility and self-care at the household level. 5. Physiatrist will provide 24 hour management of medical needs as well as oversight of the therapy plan/treatment and provide guidance as appropriate regarding the interaction of the two. 6. The Preadmission Screening has been reviewed and patient status is unchanged unless otherwise stated above. 7. 24 hour rehab nursing will assist with bladder management, bowel management, safety, skin/wound care, disease management, medication administration, pain management and patient education  and help integrate therapy concepts, techniques,education, etc. 8. PT will assess and treat for/with: Lower extremity strength, range of motion, stamina, balance, functional mobility, safety, adaptive techniques and equipment, NMR, visual-spatial awareness, family ed.   Goals are: supervision to mod I. 9. OT will assess and treat for/with: ADL's, functional mobility, safety, upper extremity strength, adaptive techniques and equipment, NMR, visual-spatial awareness, family ed.   Goals are: supervision to mod I. Therapy may proceed with showering this patient. 10. SLP will assess and treat for/with: cognition, communication, swallowing, speech.  Goals are: mod I. 11. Case Management and Social Worker will assess and treat for psychological issues and discharge planning. 12. Team conference will be held weekly to assess progress toward goals and to determine barriers to discharge. 13. Patient will receive at least 3 hours of therapy per day at least 5 days per week. 14. ELOS: 15-18 days       15. Prognosis:  excellent     Ranelle Oyster, MD, [facility] Physical Medicine & Rehabilitation 12/26/2016  Charlton Amor., PA-C 12/25/2016

## 2016-12-25 NOTE — Progress Notes (Addendum)
Physical Therapy Treatment Patient Details Name: Bryan [name] MRN: [id number] DOB: [birth year] Today's Date: 12/25/2016    History of Present Illness Patient is a 36 y/o male admitted 12/01/16 with sudden onset of headache. CTA-diffuse SAH over both convexities and extending into the basal cisterns. 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    PT Comments    Pt was able to walk a short distance in room to bathroom, however, his HR increased to the 140s, DOE 3/4 on RA and O2 sats 82% on RA with PMV donned.  He was 100% on RA at rest and could not tell me if he took the TC off or if the RN took the TC off.  He did well with the bari RW, but continues to have poor activity tolerance.  He urinated all over the floor in the bathroom when he attempted to stand to urinate and, honestly, it wore him out to try to stand to urinate after walking there.  Pt, for now, will need TC O2 for further attempts at gait and would benefit from chair to follow.  He did well with bari RW and it was left in his room. He requests to be seen in the AM after breakfast by PT.   Follow Up Recommendations  CIR     Equipment Recommendations  Wheelchair (measurements PT);Wheelchair cushion (measurements PT) (20x20)    Recommendations for Other Services   NA     Precautions / Restrictions Precautions Precautions: Fall Precaution Comments: trach, monitor HR and O2 sats    Mobility  Bed Mobility Overal bed mobility: Needs Assistance Bed Mobility: Supine to Sit     Supine to sit: Min assist;HOB elevated     General bed mobility comments: Min assist to provide a hand to pull once most of the way up.  Pt using momentum to get to EOB from supine.  HOB only minimally elevated.    Transfers Overall transfer level: Needs assistance Equipment used: Rolling walker (2 wheeled) (bari RW)   Sit to Stand: +2 physical assistance;Mod assist         General transfer comment: Two person mod assist from elevated bed to rock to standing.  Initial posterior lean, until forward momentum established with the RW.   Ambulation/Gait Ambulation/Gait assistance: +2 safety/equipment;Min assist Ambulation Distance (Feet): 15 Feet Assistive device: Rolling walker (2 wheeled) Gait Pattern/deviations: Step-through pattern;Trunk flexed Gait velocity: decreased Gait velocity interpretation: Below normal speed for age/gender General Gait Details: Pt has shoes donned, ambulated with RW to the bathroom where he attempted to stand to urinate.  He did not get urine into the commode and urinated all over the floor of the bathroom.  HR at this point was 140 and O2 sats were down in the 80s on RA with PMV donned.  recliner chair brought to the bathroom door and pt sat down.  TC O2 re-applied, but once sitting pt's sats were back into the 90s before I could get the TC back on him.  He may benefit from [facility] for walking to help maintain sats.  Pt was getting 3/4 DOE with short distance gait and attempting to stand to urinate.        Balance Overall balance assessment: Needs assistance Sitting-balance support: Feet supported;Bilateral upper extremity supported Sitting balance-Leahy Scale:  Fair     Standing balance support: Bilateral upper extremity supported Standing balance-Leahy Scale: Poor Standing balance comment: requires light upper extremity support in static standing.  RW adjusted up to fit height better at end of session, may need to re-assess RW height next session                             Cognition Arousal/Alertness: Awake/alert Behavior During Therapy: Flat affect Overall Cognitive Status: Impaired/Different from baseline Area of Impairment: Attention;Following  commands;Safety/judgement;Awareness;Problem solving                 Orientation Level: Time;Situation Current Attention Level: Sustained Memory: Decreased short-term memory Following Commands: Follows one step commands consistently;Follows multi-step commands inconsistently Safety/Judgement: Decreased awareness of safety;Decreased awareness of deficits Awareness: Intellectual Problem Solving: Slow processing;Decreased initiation;Difficulty sequencing;Requires verbal cues;Requires tactile cues General Comments: PMV in place, pt could not remember if he took the TC off or if the RN took it off.  He at times will say some non-sensical things.  I asked, "Did you have a good lunch?" and he responded with "I didn't drive" and this happened a few times during my session.              Pertinent Vitals/Pain Pain Assessment: No/denies pain Pain Score: 0-No pain           PT Goals (current goals can now be found in the care plan section) Acute Rehab PT Goals Patient Stated Goal: pt wants to get well enough that he can take care of his step dad again, he feels very guilty that he is not there for him right now.  Progress towards PT goals: Progressing toward goals    Frequency    Min 4X/week      PT Plan Current plan remains appropriate       AM-PAC PT "6 Clicks" Daily Activity  Outcome Measure  Difficulty turning over in bed (including adjusting bedclothes, sheets and blankets)?: Total Difficulty moving from lying on back to sitting on the side of the bed? : Total Difficulty sitting down on and standing up from a chair with arms (e.g., wheelchair, bedside commode, etc,.)?: Total Help needed moving to and from a bed to chair (including a wheelchair)?: A Lot Help needed walking in hospital room?: A Lot Help needed climbing 3-5 steps with a railing? : Total 6 Click Score: 8    End of Session   Activity Tolerance: Patient limited by fatigue;Other (comment) (limited by low  O2 sats, DOE, increased HR) Patient left: in chair;with call bell/phone within reach   PT Visit Diagnosis: Muscle weakness (generalized) (M62.81);Other symptoms and signs involving the nervous system (A21.308(R29.898)     Time: 1457-1520 PT Time Calculation (min) (ACUTE ONLY): 23 min  Charges:  $Therapeutic Activity: 23-37 mins          Lin Glazier B. Lailie Smead, PT, DPT [phone]            12/25/2016, 4:21 PM

## 2016-12-25 NOTE — Progress Notes (Signed)
No issues overnight. Sitting at side of bed, bathing this am.  EXAM:  BP 126/87 (BP Location: Left Wrist)   Pulse 90   Temp 98.6 F (37 C) (Oral)   Resp 16   Ht 5\' 11"  (1.803 m)   Wt (!) 189.1 kg (417 lb)   SpO2 97%   BMI 58.16 kg/m   Awake, alert, oriented  CN grossly intact  5/5 BUE/BLE   IMPRESSION:  36 y.o. male s/p SAH, doing well. Remains afebrile.  PLAN: - Stable for transfer to CIR if/when bed available

## 2016-12-25 NOTE — Clinical Social Work Note (Signed)
Clinical Social Work Assessment  Patient Details  Name: Bryan [name] MRN: [id number] Date of Birth: [birth year]  Date of referral:  12/25/16               Reason for consult:  Facility Placement                Permission sought to share information with:  Facility Industrial/product designerContact Representative Permission granted to share information::  Yes, Verbal Permission Granted  Name::        Agency::  [facility]  Relationship::     Contact Information:     Housing/Transportation Living arrangements for the past 2 months:  Single Family Home Source of Information:  Patient Patient Interpreter Needed:  None Criminal Activity/Legal Involvement Pertinent to Current Situation/Hospitalization:  No - Comment as needed Significant Relationships:  Parents Lives with:  Parents Do you feel safe going back to the place where you live?  No Need for family participation in patient care:  No (Coment)  Care giving concerns:  Pt is from home with mom who is unable to provide sufficient physical assist for pt given current weakness/impairment.   Social Worker assessment / plan:  CSW spoke with pt about SNF as alternative to CIR.  CSW explained SNF vs CIR and explained SNF and SNF referral process.   Employment status:  [employer]ical [employer] Insurance information:  Managed Care PT Recommendations:  Inpatient Rehab Consult Information / Referral to community resources:  Skilled Nursing Facility  Patient/Family's Response to care:  Pt concerned about how SNF search would affect CIR admission- CSW ensured pt that it would have no affect on whether or not CIR can accept pt at DC.  Pt is agreeable to SNF search but still hopeful for CIR.  Patient/Family's Understanding of and Emotional Response to Diagnosis, Current Treatment, and Prognosis:  No questions or concerns at this time- hopeful for CIR admission so he could get home sooner.  Emotional Assessment Appearance:  Appears stated age Attitude/Demeanor/Rapport:    Affect  (typically observed):  Appropriate, Accepting Orientation:  Oriented to Self, Oriented to Place, Oriented to  Time, Oriented to Situation Alcohol / Substance use:  Not Applicable Psych involvement (Current and /or in the community):  No (Comment)  Discharge Needs  Concerns to be addressed:  Care Coordination Readmission within the last 30 days:  No Current discharge risk:  Physical Impairment Barriers to Discharge:  Continued Medical Work up   Burna SisUris, Dafney Farler H, LCSW 12/25/2016, 12:18 PM

## 2016-12-25 NOTE — Progress Notes (Signed)
Inpatient Rehabilitation  Continuing to follow along for timing of medical readiness, insurance authorization, and bed availability.  Hopeful for an insurance decision today and plan to update the team as soon as I know.  Please call with questions.   Charlane FerrettiMelissa Brittan Butterbaugh, M.A., CCC/SLP Admission Coordinator  [facility]  Cell [phone]

## 2016-12-25 NOTE — Progress Notes (Signed)
Inpatient Rehabilitation  [employer] [place] still reviewing the case and a decision for IP Rehab authorization has not been made.  Plan to follow up tomorrow.  Please call with questions.   Charlane FerrettiMelissa [place], M.A., CCC/SLP Admission Coordinator  [facility]  Cell [phone]

## 2016-12-25 NOTE — Progress Notes (Signed)
Reviewed labs/imaging Dr Conchita ParisNundkumar ordered yesterday for fever CXR: Normal CBC: Leukocytosis UA: Nitrite positive Blood culture: Pending  Plan Appears to have UTI Will send urine for culture (add on) Start on Bactrim BID x10days Adjust pending culture Monitor fever, CBC

## 2016-12-25 NOTE — NC FL2 (Signed)
[place] MEDICAID FL2 LEVEL OF CARE SCREENING TOOL     IDENTIFICATION  Patient Name: Bryan [name] Birthdate: 11/19/1980 Sex: male Admission Date (Current Location): 12/01/2016  [facility] and [place] Number:  [employer] and Address:  The . [facility], [address] [place], [place] [address]      Provider Number: [id number]  Attending Physician Name and Address:  Lisbeth Renshaw, MD  Relative Name and Phone Number:       Current Level of Care: Hospital Recommended Level of Care: Skilled Nursing Facility Prior Approval Number:    Date Approved/Denied:   PASRR Number: [id number]  Discharge Plan: SNF    Current Diagnoses: Patient Active Problem List   Diagnosis Date Noted  . Pressure injury of skin 12/25/2016  . Encounter for orogastric (OG) tube placement   . Bad headache   . Syncope   . Endotracheally intubated   . Hypertension   . Respiratory failure with hypoxia (HCC)   . SAH (subarachnoid hemorrhage) (HCC) 12/01/2016    Orientation RESPIRATION BLADDER Height & Weight     Self, Time, Situation, Place  Tracheostomy, O2 (28%) Incontinent Weight: (!) 417 lb (189.1 kg) Height:  5\' 11"  (180.3 cm)  BEHAVIORAL SYMPTOMS/MOOD NEUROLOGICAL BOWEL NUTRITION STATUS      Incontinent Diet (see DC summary)  AMBULATORY STATUS COMMUNICATION OF NEEDS Skin   Extensive Assist Verbally PU Stage and Appropriate Care     PU Stage 3 Dressing:  (foam dressing located on neck)                 Personal Care Assistance Level of Assistance  Bathing, Dressing Bathing Assistance: Maximum assistance   Dressing Assistance: Maximum assistance     Functional Limitations Info             SPECIAL CARE FACTORS FREQUENCY  PT (By licensed PT), OT (By licensed OT), Speech therapy     PT Frequency: 5/wk OT Frequency: 5/wk     Speech Therapy Frequency: 3/wk      Contractures      Additional Factors Info  Code Status, Allergies,  Suctioning Needs Code Status Info: FULL Allergies Info: penicillins       Suctioning Needs: q4 hours   Current Medications (12/25/2016):  This is the current hospital active medication list Current Facility-Administered Medications  Medication Dose Route Frequency Provider Last Rate Last Dose  . acetaminophen (TYLENOL) tablet 650 mg  650 mg Oral Q4H PRN Julio Sicks, MD   650 mg at 12/23/16 2309   Or  . acetaminophen (TYLENOL) solution 650 mg  650 mg Per Tube Q4H PRN Julio Sicks, MD   650 mg at 12/18/16 1704   Or  . acetaminophen (TYLENOL) suppository 650 mg  650 mg Rectal Q4H PRN Julio Sicks, MD      . albuterol (PROVENTIL) (2.5 MG/3ML) 0.083% nebulizer solution 2.5 mg  2.5 mg Nebulization Q4H PRN Lisbeth Renshaw, MD   2.5 mg at 12/16/16 0730  . chlorhexidine (PERIDEX) 0.12 % solution 15 mL  15 mL Mouth Rinse BID Nelda Bucks, MD   15 mL at 12/25/16 1100  . feeding supplement (PRO-STAT SUGAR FREE 64) liquid 30 mL  30 mL Oral BID Lisbeth Renshaw, MD   30 mL at 12/25/16 1100  . hydrALAZINE (APRESOLINE) injection 10 mg  10 mg Intravenous Q6H PRN Tobey Grim, NP   10 mg at 12/09/16 2022  . lipase/protease/amylase (CREON) capsule 36,000 Units  36,000 Units Oral Once [address],  Mcarthur Rossettianiel J, MD      . loperamide (IMODIUM) 1 MG/5ML solution 4 mg  4 mg Per Tube PRN Nelda BucksFeinstein, Daniel J, MD   4 mg at 12/19/16 1230  . MEDLINE mouth rinse  15 mL Mouth Rinse q12n4p Nelda BucksFeinstein, Daniel J, MD   15 mL at 12/25/16 1107  . ondansetron (ZOFRAN-ODT) disintegrating tablet 4 mg  4 mg Oral Q6H PRN Julio SicksPool, Henry, MD       Or  . ondansetron (ZOFRAN) injection 4 mg  4 mg Intravenous Q6H PRN Julio SicksPool, Henry, MD      . pantoprazole sodium (PROTONIX) 40 mg/20 mL oral suspension 40 mg  40 mg Per Tube Daily Julio SicksPool, Henry, MD   40 mg at 12/25/16 1100  . RESOURCE THICKENUP CLEAR   Oral PRN Lisbeth RenshawNundkumar, Neelesh, MD      . sodium chloride flush (NS) 0.9 % injection 10-40 mL  10-40 mL Intracatheter Q12H Lisbeth RenshawNundkumar,  Neelesh, MD   10 mL at 12/25/16 1100  . sodium chloride flush (NS) 0.9 % injection 10-40 mL  10-40 mL Intracatheter PRN Lisbeth RenshawNundkumar, Neelesh, MD   10 mL at 12/23/16 1728     Discharge Medications: Please see discharge summary for a list of discharge medications.  Relevant Imaging Results:  Relevant Lab Results:   Additional Information SS#: [id number]  Burna SisUris, Opaline [place] H, LCSW

## 2016-12-25 NOTE — Progress Notes (Signed)
Occupational Therapy Treatment Patient Details Name: Bryan KaysDarren D [name] MRN: [id number] DOB: 16-Jul-1981 Today's Date: 12/25/2016    History of present illness Patient is a 36 y/o male admitted 12/01/16 with sudden onset of headache. CTA-diffuse SAH over both convexities and extending into the basal cisterns. 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   OT comments  Pt demonstrates improving activity tolerance and ability to perform ADLs - min - max A.  Mod A for sit to stand.  Affect much brigher, and pt more engaged today.   Recommend CIR.   Follow Up Recommendations  CIR    Equipment Recommendations  3 in 1 bedside commode    Recommendations for Other Services Rehab consult    Precautions / Restrictions Precautions Precautions: Fall Precaution Comments: [facility], monitor HR and O2 sats       Mobility Bed Mobility Overal bed mobility: Needs Assistance Bed Mobility: Supine to Sit     Supine to sit: Min assist;HOB elevated     General bed mobility comments: Min assist to provide a hand to pull once most of the way up.  Pt using momentum to get to EOB from supine.  HOB only minimally elevated.   Transfers Overall transfer level: Needs assistance Equipment used: Rolling walker (2 wheeled) Transfers: Sit to/from Stand Sit to Stand: Mod assist         General transfer comment: mod A to boost into standing     Balance Overall balance assessment: Needs assistance Sitting-balance support: Feet supported;Bilateral upper extremity supported Sitting balance-Leahy Scale: Fair     Standing balance support: Bilateral upper extremity supported Standing balance-Leahy Scale: Poor Standing balance comment: requires UE support                             ADL either performed or assessed with clinical judgement   ADL Overall ADL's : Needs assistance/impaired Eating/Feeding: NPO           Lower Body Bathing: Moderate assistance;Sit to/from stand       Lower Body Dressing: Maximal assistance;Sit to/from stand   Toilet Transfer: Moderate assistance;Stand-pivot;BSC           Functional mobility during ADLs: Moderate assistance;+2 for safety/equipment       Vision   Additional Comments: Pt denies visual changes    Perception     Praxis      Cognition Arousal/Alertness: Awake/alert Behavior During Therapy: WFL for tasks assessed/performed Overall Cognitive Status: Impaired/Different from baseline Area of Impairment: Attention;Safety/judgement;Problem solving;Awareness                 Orientation Level: Time;Situation Current Attention Level: Selective Memory: Decreased short-term memory Following Commands: Follows multi-step commands inconsistently Safety/Judgement: Decreased awareness of safety Awareness: Intellectual Problem Solving: Decreased initiation;Requires verbal cues General Comments: PMV in place, pt could not remember if he took the TC off or if the RN took it off.  He at times will say some non-sensical things.  I asked, "Did you have a good lunch?" and he responded with "I didn't drive" and this happened a few times during my session.         Exercises     Shoulder Instructions       General Comments VSS  Pertinent Vitals/ Pain       Pain Assessment: No/denies pain Pain Score: 0-No pain  Home Living                                          Prior Functioning/Environment              Frequency  Min 2X/week        Progress Toward Goals  OT Goals(current goals can now be found in the care plan section)  Progress towards OT goals: Progressing toward goals  Acute Rehab OT Goals Patient Stated Goal: pt wants to get well enough that he can  take care of his step dad again, he feels very guilty that he is not there for him right now.   Plan Discharge plan remains appropriate    Co-evaluation                 AM-PAC PT "6 Clicks" Daily Activity     Outcome Measure   Help from another person eating meals?: None Help from another person taking care of personal grooming?: A Little Help from another person toileting, which includes using toliet, bedpan, or urinal?: A Lot Help from another person bathing (including washing, rinsing, drying)?: A Lot Help from another person to put on and taking off regular upper body clothing?: A Lot Help from another person to put on and taking off regular lower body clothing?: A Lot 6 Click Score: 15    End of Session Equipment Utilized During Treatment: Oxygen  OT Visit Diagnosis: Muscle weakness (generalized) (M62.81);Cognitive communication deficit (R41.841) Symptoms and signs involving cognitive functions: Nontraumatic SAH   Activity Tolerance Patient tolerated treatment well   Patient Left with call bell/phone within reach;in bed;with nursing/sitter in room   Nurse Communication Mobility status        Time: 1610-9604 OT Time Calculation (min): 28 min  Charges: OT General Charges $OT Visit: 1 Procedure OT Treatments $Neuromuscular Re-education: 23-37 mins  [employer], OTR/L [phone]    Jeani Hawking M 12/25/2016, 5:10 PM

## 2016-12-26 ENCOUNTER — Inpatient Hospital Stay (HOSPITAL_COMMUNITY)
Admission: RE | Admit: 2016-12-26 | Discharge: 2017-01-07 | DRG: 057 | Disposition: A | Discharge status: Home / Self Care | Payer: BLUE CROSS/BLUE SHIELD | Source: Intra-hospital | Attending: Physical Medicine & Rehabilitation | Admitting: Physical Medicine & Rehabilitation

## 2016-12-26 DIAGNOSIS — Z86718 Personal history of other venous thrombosis and embolism: Secondary | ICD-10-CM

## 2016-12-26 DIAGNOSIS — Z930 Tracheostomy status: Secondary | ICD-10-CM

## 2016-12-26 DIAGNOSIS — I10 Essential (primary) hypertension: Secondary | ICD-10-CM | POA: Diagnosis present

## 2016-12-26 DIAGNOSIS — N390 Urinary tract infection, site not specified: Secondary | ICD-10-CM | POA: Diagnosis present

## 2016-12-26 DIAGNOSIS — Z880 Allergy status to penicillin: Secondary | ICD-10-CM

## 2016-12-26 DIAGNOSIS — I6032 Nontraumatic subarachnoid hemorrhage from left posterior communicating artery: Secondary | ICD-10-CM | POA: Diagnosis present

## 2016-12-26 DIAGNOSIS — I69219 Unspecified symptoms and signs involving cognitive functions following other nontraumatic intracranial hemorrhage: Secondary | ICD-10-CM | POA: Diagnosis present

## 2016-12-26 DIAGNOSIS — R1310 Dysphagia, unspecified: Secondary | ICD-10-CM | POA: Diagnosis present

## 2016-12-26 DIAGNOSIS — F17220 Nicotine dependence, chewing tobacco, uncomplicated: Secondary | ICD-10-CM | POA: Diagnosis present

## 2016-12-26 DIAGNOSIS — I609 Nontraumatic subarachnoid hemorrhage, unspecified: Secondary | ICD-10-CM

## 2016-12-26 DIAGNOSIS — R269 Unspecified abnormalities of gait and mobility: Secondary | ICD-10-CM | POA: Diagnosis not present

## 2016-12-26 DIAGNOSIS — R1312 Dysphagia, oropharyngeal phase: Secondary | ICD-10-CM | POA: Diagnosis not present

## 2016-12-26 DIAGNOSIS — Z6841 Body Mass Index (BMI) 40.0 and over, adult: Secondary | ICD-10-CM | POA: Diagnosis not present

## 2016-12-26 LAB — GLUCOSE, CAPILLARY
GLUCOSE-CAPILLARY: 119 mg/dL — AB (ref 65–99)
Glucose-Capillary: 102 mg/dL — ABNORMAL HIGH (ref 65–99)
Glucose-Capillary: 86 mg/dL (ref 65–99)
Glucose-Capillary: 144 mg/dL — ABNORMAL HIGH (ref 65–99)

## 2016-12-26 MED ORDER — SULFAMETHOXAZOLE-TRIMETHOPRIM 800-160 MG PO TABS
1.0000 | ORAL_TABLET | Freq: Two times a day (BID) | ORAL | Status: DC
  Administered 2016-12-26 – 2016-12-30 (×8): 1 via ORAL
  Filled 2016-12-26 (×8): qty 1

## 2016-12-26 MED ORDER — ONDANSETRON HCL 4 MG/2ML IJ SOLN: 4.0000 mg | Freq: Four times a day (QID) | INTRAMUSCULAR | Status: DC | PRN

## 2016-12-26 MED ORDER — SULFAMETHOXAZOLE-TRIMETHOPRIM 800-160 MG PO TABS: 1.0000 | ORAL_TABLET | Freq: Two times a day (BID) | ORAL | 0 refills | Status: DC

## 2016-12-26 MED ORDER — CHLORHEXIDINE GLUCONATE 0.12 % MT SOLN
15.0000 mL | Freq: Two times a day (BID) | OROMUCOSAL | Status: DC
  Administered 2016-12-26 – 2017-01-06 (×23): 15 mL via OROMUCOSAL
  Filled 2016-12-26 (×23): qty 15

## 2016-12-26 MED ORDER — SORBITOL 70 % SOLN: 30.0000 mL | Freq: Every day | Status: DC | PRN

## 2016-12-26 MED ORDER — ACETAMINOPHEN 650 MG RE SUPP: 650.0000 mg | RECTAL | Status: DC | PRN

## 2016-12-26 MED ORDER — PRO-STAT SUGAR FREE PO LIQD
30.0000 mL | Freq: Two times a day (BID) | ORAL | Status: DC
  Administered 2016-12-26 – 2017-01-06 (×23): 30 mL via ORAL
  Filled 2016-12-26 (×23): qty 30

## 2016-12-26 MED ORDER — SODIUM CHLORIDE 0.45 % IV SOLN
INTRAVENOUS | Status: DC
  Administered 2016-12-26 – 2017-01-01 (×7): via INTRAVENOUS

## 2016-12-26 MED ORDER — PANTOPRAZOLE SODIUM 40 MG PO PACK
40.0000 mg | PACK | Freq: Every day | ORAL | Status: DC
  Filled 2016-12-26: qty 20

## 2016-12-26 MED ORDER — ALBUTEROL SULFATE (2.5 MG/3ML) 0.083% IN NEBU: 2.5000 mg | INHALATION_SOLUTION | RESPIRATORY_TRACT | Status: DC | PRN

## 2016-12-26 MED ORDER — ACETAMINOPHEN 160 MG/5ML PO SOLN: 650.0000 mg | ORAL | Status: DC | PRN

## 2016-12-26 MED ORDER — ACETAMINOPHEN 325 MG PO TABS
650.0000 mg | ORAL_TABLET | ORAL | Status: DC | PRN
  Filled 2016-12-26: qty 2

## 2016-12-26 MED ORDER — ORAL CARE MOUTH RINSE
15.0000 mL | Freq: Two times a day (BID) | OROMUCOSAL | Status: DC
  Administered 2016-12-31 – 2017-01-05 (×3): 15 mL via OROMUCOSAL

## 2016-12-26 MED ORDER — ONDANSETRON HCL 4 MG PO TABS: 4.0000 mg | ORAL_TABLET | Freq: Four times a day (QID) | ORAL | Status: DC | PRN

## 2016-12-26 NOTE — Progress Notes (Signed)
Inpatient Rehabilitation  I have received insurance authorization and have a bed available to offer Mr. Bryan [name] today.  I have spoken with Dr. Conchita ParisNundkumar and received medical clearance to admit patient to IP Rehab.  I have notified the team.  Please call with question

## 2016-12-26 NOTE — Progress Notes (Signed)
Ranelle OysterSwartz, Zachary T, MD Physician Addendum Physical Medicine and Rehabilitation  Consult Note Date of Service: 12/18/2016 6:58 AM  Related encounter: ED to Hosp-Admission (Discharged) [facility] [place] [facility]     Expand All Collapse All   [] Hide copied text [] Hover for attribution information      Physical Medicine and Rehabilitation Consult Reason for Consult: Decreased functional mobility Referring Physician: Dr. Conchita ParisNundkumar   HPI: Bryan [name] is a [age] y.o. right handed male with morbid obesity on no prescription medications. Per chart review patient lives with parent independent prior to admission. Presented 12/01/2016 with sudden onset of headache. No recent fall or trauma reported. No history of seizures. 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 Patient developed worsening hypoxemia increasing shortness of breath requiring intubation. 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 Conchita ParisNundkumar. Hospital course requiring tracheostomy 12/15/2016 per Dr. Pollyann Kennedyosen. Findings of acute DVT left popliteal vein as well as left posterior tibial veins and underwent placement of IVC filter per interventional radiology. Maintained on Nimotop for blood pressure control. Tube feeds for nutritional support. Physical and occupational therapy evaluations completed 12/16/2016 with recommendations of physical medicine rehabilitation consult.   Review of Systems  Unable to perform ROS: Acuity of condition       Past Medical History:  Diagnosis Date  . Complication of anesthesia    mom states they almost lost him but not sure if he was given to much medication  . Varicose veins    mutilple bleeding episodes, bilateral lower  extremities)        Past Surgical History:  Procedure Laterality Date  . CRANIOTOMY Left 12/02/2016   Procedure: CRANIOTOMY FOR CLIPPING OF INTRACRANIAL ANEURYSM;  Surgeon: Lisbeth RenshawNundkumar, Neelesh, MD;  Location: [facility] OR;  Service: Neurosurgery;  Laterality: Left;  . IR FLUORO RM 30-60 MIN  12/02/2016  . IR IVC FILTER PLMT / S&I /IMG GUID/MOD SED  12/11/2016  . IR US GUIDE VASC ACCESS RIGHT  12/02/2016  . lipo around heart  at age 36   pt states its bc they could hear his  heart beat  . ORIF ANKLE FRACTURE Left 09/02/2012   Procedure: OPEN REDUCTION INTERNAL FIXATION (ORIF) ANKLE FRACTURE;  Surgeon: Valeria BatmanPeter W Whitfield, MD;  Location: [facility] OR;  Service: Orthopedics;  Laterality: Left;  OPEN REDUCTION INTERNAL FIXATION LEFT DISTAL FIBULA FRACTURE WITH REDUCTION FIXATION DISTAL TIBIA/FIBULAR DIASTASIS   . RADIOLOGY WITH ANESTHESIA N/A 12/02/2016   Procedure: RADIOLOGY WITH ANESTHESIA;  Surgeon: Lisbeth RenshawNundkumar, Neelesh, MD;  [facility];  Service: Radiology;  Laterality: N/A;  . TRACHEOSTOMY TUBE PLACEMENT N/A 12/15/2016   Procedure: TRACHEOSTOMY;  Surgeon: Serena Colonelosen, Jefry, MD;  [facility];  Service: ENT;  Laterality: N/A;   Family History: No reported HTN, DM in father or mother    Social History:  reports that he has never smoked. His smokeless tobacco use includes Chew. He reports that he drinks alcohol. He reports that he does not use drugs. Allergies:       Allergies  Allergen Reactions  . Penicillins Hives and Other (See Comments)    Mouth blisters - childhood allergy Has patient had a PCN reaction causing immediate rash, facial/tongue/throat swelling, SOB or lightheadedness with hypotension: Yes Has patient had a PCN reaction causing severe rash involving mucus membranes or  skin necrosis: No Has patient had a PCN reaction that required hospitalization Yes Has patient had a PCN reaction occurring within the last 10 years: No If all of the above answers are "NO", then may proceed with  Cephalosporin use.         Medications Prior to Admission  Medication Sig Dispense Refill  . oxyCODONE-acetaminophen (PERCOCET/ROXICET) 5-325 MG per tablet Take 1-2 tablets by mouth every 4 (four) hours as needed. (Patient not taking: Reported on 01/12/2015) 60 tablet 0    Home: Home Living Family/patient expects to be discharged to:: Private residence Living Arrangements: Parent Available Help at Discharge: Family Home Access: Stairs to enter Entrance Stairs-Number of Steps: could not specify  Functional History: Prior Function Level of Independence: Independent Comments: Reports working PTA but difficult to get history due to trach. Functional Status:  Mobility: Bed Mobility Overal bed mobility: Needs Assistance Bed Mobility: Rolling, Supine to Sit, Sit to Supine Rolling: Max assist Supine to sit: Max assist, +2 for physical assistance, +2 for safety/equipment Sit to supine: Max assist, +2 for physical assistance, +2 for safety/equipment, HOB elevated General bed mobility comments: Patient able to use UEs to assist with rolling and repositioning. Also attempting to pull to sit and brace for return to supine. Assist to bring LEs to EOB and elevate trunk; assist of 2 to return to supine. Pt with posterior lean upon fatigue. Transfers General transfer comment: unable to attempt   ADL: ADL Overall ADL's : Needs assistance/impaired Eating/Feeding: NPO Grooming: Wash/dry hands, Wash/dry face, Oral care, Brushing hair, Moderate assistance, Bed level Upper Body Bathing: Maximal assistance, Bed level Lower Body Bathing: Total assistance, +2 for physical assistance, Bed level Upper Body Dressing : Total assistance, Bed level Lower Body Dressing: Total assistance, +2 for physical assistance, Bed level Toilet Transfer: Total assistance Toilet Transfer Details (indicate cue type and reason): unable to attempt  Toileting- Clothing Manipulation and Hygiene: Total assistance, +2 for  physical assistance, Bed level Functional mobility during ADLs: Total assistance, +2 for physical assistance General ADL Comments: pt slow to respond, and slow to initiate activiity   Cognition: Cognition Overall Cognitive Status: Difficult to assess Arousal/Alertness: Awake/alert Orientation Level: Oriented to person, Intubated/Tracheostomy - Unable to assess, Disoriented to situation, Disoriented to time, Disoriented to place Problem Solving: Impaired Problem Solving Impairment: Functional basic Cognition Arousal/Alertness: Awake/alert Behavior During Therapy: Flat affect Overall Cognitive Status: Difficult to assess Area of Impairment: Attention, Following commands, Problem solving Current Attention Level: Sustained Following Commands: Follows one step commands consistently, Follows one step commands with increased time Problem Solving: Slow processing, Difficulty sequencing, Decreased initiation, Requires verbal cues General Comments: Patient more alert and engaged in session when sitting upright at EOB, following commands with improved consistency. Able to vocalize x1 with speech using PMSV Difficult to assess due to: Tracheostomy  Blood pressure (!) 102/44, pulse (!) 102, temperature (!) 100.5 F (38.1 C), temperature source Rectal, resp. rate (!) 29, height 5\' 11"  (1.803 m), weight (!) 210.9 kg (465 lb), SpO2 98 %. Physical Exam  Vitals reviewed. Constitutional:  36 year old right-handed morbidly obese male  HENT:  Nasogastric tube in place. Left scalp incision clean with partial dressing  Eyes:  Pupils reactive to light. Right scleral hemorrhage  Neck:  Tracheostomy tube in place  Cardiovascular: Normal rate and regular rhythm.   Respiratory:  Decreased breath sounds at the bases  GI: Soft. Bowel sounds are normal. There is no tenderness.  Neurological:  Patient makes eye contact with examiner. He will use  yes no head nods. Followed simple commands, poor oro-motor  control. Moves all 4 limbs. Seems to sense pain in all 4.   Psychiatric:  Awake, flat. Delayed processing.     Lab Results Last 24 Hours       Results for orders placed or performed during the hospital encounter of 12/01/16 (from the past 24 hour(s))  Glucose, capillary     Status: Abnormal   Collection Time: 12/17/16  7:47 AM  Result Value Ref Range   Glucose-Capillary 100 (H) 65 - 99 mg/dL   Comment 1 Notify RN    Comment 2 Document in Chart   Glucose, capillary     Status: None   Collection Time: 12/17/16 11:51 AM  Result Value Ref Range   Glucose-Capillary 97 65 - 99 mg/dL   Comment 1 Notify RN    Comment 2 Document in Chart   Glucose, capillary     Status: None   Collection Time: 12/17/16  4:10 PM  Result Value Ref Range   Glucose-Capillary 90 65 - 99 mg/dL   Comment 1 Notify RN    Comment 2 Document in Chart   Glucose, capillary     Status: None   Collection Time: 12/17/16  8:31 PM  Result Value Ref Range   Glucose-Capillary 87 65 - 99 mg/dL  Glucose, capillary     Status: None   Collection Time: 12/17/16 11:36 PM  Result Value Ref Range   Glucose-Capillary 92 65 - 99 mg/dL  Glucose, capillary     Status: Abnormal   Collection Time: 12/18/16  3:09 AM  Result Value Ref Range   Glucose-Capillary 113 (H) 65 - 99 mg/dL  Basic metabolic panel     Status: Abnormal   Collection Time: 12/18/16  5:35 AM  Result Value Ref Range   Sodium 144 135 - 145 mmol/L   Potassium 4.0 3.5 - 5.1 mmol/L   Chloride 111 101 - 111 mmol/L   CO2 27 22 - 32 mmol/L   Glucose, Bld 113 (H) 65 - 99 mg/dL   BUN 27 (H) 6 - 20 mg/dL   Creatinine, Ser 0.98 0.61 - 1.24 mg/dL   Calcium 9.2 8.9 - 11.9 mg/dL   GFR calc non Af Amer >60 >60 mL/min   GFR calc Af Amer >60 >60 mL/min   Anion gap 6 5 - 15      Imaging Results (Last 48 hours)  Dg Abd Portable 1v  Result Date: 12/16/2016 CLINICAL DATA:  Impaired nasogastric feeding tube. EXAM: PORTABLE ABDOMEN -  1 VIEW COMPARISON:  Radiograph of Dec 15, 2016. FINDINGS: The bowel gas pattern is normal. Distal portion of nasogastric tube is seen coiled within the proximal stomach. No radio-opaque calculi are seen. IVC filter is noted. IMPRESSION: No evidence of bowel obstruction or ileus. There is been partial retraction of nasogastric tube, with distal portion coiled within proximal stomach. Electronically Signed   By: Lupita Raider, M.D.   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 Does the need for close, 24 hr/day medical supervision in concert with the patient's rehab needs make it unreasonable for this patient to be served in a less intensive setting? Yes 2. Co-Morbidities requiring supervision/potential complications: HTN, dysphagia, trach mgt 3. Due to bladder management, bowel management, safety, skin/wound care, disease management, medication administration, pain management and patient education, does the patient require 24 hr/day rehab nursing?  Yes 4. Does the patient require coordinated care of a physician, rehab nurse, PT (1-2 hrs/day, 5 days/week), OT (1-2 hrs/day, 5 days/week) and SLP (1-2 hrs/day, 5 days/week) to address physical and functional deficits in the context of the above medical diagnosis(es)? Yes Addressing deficits in the following areas: balance, endurance, locomotion, strength, transferring, bowel/bladder control, bathing, dressing, feeding, grooming, toileting, cognition, speech, language, swallowing and psychosocial support 5. Can the patient actively participate in an intensive therapy program of at least 3 hrs of therapy per day at least 5 days per week? Yes 6. The potential for patient to make measurable gains while on inpatient rehab is excellent 7. Anticipated functional outcomes upon discharge from inpatient rehab are supervision and min  assist  with PT, supervision and min assist with OT, modified independent, supervision and min assist with SLP. 8. Estimated rehab length of stay to reach the above functional goals is: 20-30 days 9. Anticipated D/C setting: Home 10. Anticipated post D/C treatments: HH therapy and Outpatient therapy 11. Overall Rehab/Functional Prognosis: excellent  RECOMMENDATIONS: This patient's condition is appropriate for continued rehabilitative care in the following setting: CIR Patient has agreed to participate in recommended program. N/A Note that insurance prior authorization may be required for reimbursement for recommended care.  Comment: Will follow along for neurological and functional progress. Rehab Admissions Coordinator to see  Thanks,  Ranelle Oyster, MD, [place] [facility]    Charlton Amor., PA-C 12/18/2016    Revision History                             Routing History

## 2016-12-26 NOTE — Discharge Summary (Addendum)
Physician Discharge Summary  Patient ID: Bryan [name] MRN: [id number] DOB/AGE: 35-Oct-1982 35 y.o.  Admit date: 12/01/2016 Discharge date: 12/26/2016  Admission Diagnoses:  Subarachnoid Hemorrhage  Discharge Diagnoses:  Same Active Problems:   SAH (subarachnoid hemorrhage) (HCC)   Endotracheally intubated   Hypertension   Respiratory failure with hypoxia (HCC)   Bad headache   Syncope   Encounter for orogastric (OG) tube placement   Pressure injury of skin Morbid obesity Diastolic heart failure - acute on chronic Oropharyngeal dysphagia Pressure ulcer - neck likely related to tracheostomy ties, secretions UTI associated with Foley catheter  Discharged Condition: Stable  Hospital Course:  Bryan [name] is a 36 y.o. male who presented to ER with acute onset HA associated with syncope and HTN. Imaging revealed a SAH. Unable to perform arteriogram due to body habitus which lead to open crani for surgical correction. He remained intubated after surgery secondary to respiratory failure and based on body habitus, requiring  Tracheostomy by Dr Pollyann Kennedyosen on 5/21. He remained in ICU for monitoring. He bean to make great progress with PT/OT and he was rec for CIR. Agree with this decision. Of note, patient was febrile on 5/28 so work up was performed including: CXR, CBC, UA, Blood cultures and urine cultures. Found to have UTI. Tx empirically with Bactrim BID x10days. Rec continued monitoring with urine culture. He was discharged in stable condition.  Treatments: Surgeries:  Bryan [name], Neurosurgery: 1. Left pterional craniotomy for clipping of posterior communicating artery aneurysm 2. Use of operating microscope for microdissection   Discharge Exam: Blood pressure (!) 111/37, pulse 85, temperature 98.6 F (37 C), temperature source Oral, resp. rate 16, height 5\' 11"  (1.803 m), weight (!) 189.1 kg (417 lb), SpO2 99 %. Awake, alert, oriented Speech fluent, appropriate CN grossly intact 5/5  BUE/BLE Wound c/d/i  Disposition: CIR  Discharge Instructions    Call MD for:  difficulty breathing, headache or visual disturbances    Complete by:  As directed    Call MD for:  persistant dizziness or light-headedness    Complete by:  As directed    Call MD for:  redness, tenderness, or signs of infection (pain, swelling, redness, odor or green/yellow discharge around incision site)    Complete by:  As directed    Call MD for:  severe uncontrolled pain    Complete by:  As directed    Call MD for:  temperature >100.4    Complete by:  As directed    Diet general    Complete by:  As directed    Driving Restrictions    Complete by:  As directed    Do not drive until given clearance.   Increase activity slowly    Complete by:  As directed    Lifting restrictions    Complete by:  As directed    Do not lift anything >10lbs. Avoid bending and twisting in awkward positions. Avoid bending at the back.     Allergies as of 12/26/2016      Reactions   Penicillins Hives, Other (See Comments)   Mouth blisters - childhood allergy Has patient had a PCN reaction causing immediate rash, facial/tongue/throat swelling, SOB or lightheadedness with hypotension: Yes Has patient had a PCN reaction causing severe rash involving mucus membranes or skin necrosis: No Has patient had a PCN reaction that required hospitalization Yes Has patient had a PCN reaction occurring within the last 10 years: No If all of the above answers are "NO", then may  proceed with Cephalosporin use.      Medication List    TAKE these medications   oxyCODONE-acetaminophen 5-325 MG tablet Commonly known as:  PERCOCET/ROXICET Take 1-2 tablets by mouth every 4 (four) hours as needed.   sulfamethoxazole-trimethoprim 800-160 MG tablet Commonly known as:  BACTRIM DS,SEPTRA DS Take 1 tablet by mouth 2 (two) times daily.      Follow-up Information    Lisbeth Renshaw, MD. Schedule an appointment as soon as possible for a  visit in 3 week(s).   Specialty:  Neurosurgery Why:  First post operative visit Contact information: [address] [place] [address] [phone]           Signed: Alyson Ingles 12/26/2016, 9:46 AM

## 2016-12-26 NOTE — Progress Notes (Signed)
[facility]ae [facility] Admission Coordinator Signed Physical Medicine and Rehabilitation  PMR Pre-admission Date of Service: 12/19/2016 3:24 PM  Related encounter: ED to Hosp-Admission (Discharged) from 12/01/2016 in [facility]       [] Hide copied text PMR Admission Coordinator Pre-Admission Assessment  Patient: Bryan [name] is an 36 y.o., male MRN: [id number] DOB: 03/27/1981 Height: 5\' 11"  (180.3 cm) Weight: (!) 189.1 kg (417 lb)                                                                                                                                                  Insurance Information HMO:     PPO: X     PCP:      IPA:      80/20:      OTHER:  PRIMARY: [employer] [place]      Policy#: [id number]      Subscriber: Self CM Name: Aram Beecham      Phone#: [phone]     Fax#: [phone] Pre-Cert#: [id number], given by Arabella Merles, for 12/25/17-01/08/17 with faxed updates due 01/07/17 with authorization number on coversheet      Employer: Full Time Benefits:  Phone #: [phone] transferred to out of state      Name: Verified via automated system  Eff. Date: 10/26/16     Deduct: $2700      Out of Pocket Max: [phone]      Life Max: N/A CIR: 70%/30%      SNF: 70%/30%, for 60 days  Outpatient: 70%, for 60 visits combined, 20 remaining     Co-Pay: 30% Home Health: 70%, for 60 visits      Co-Pay: 30% DME: 70%     Co-Pay: 30% Providers: in network   Medicaid Application Date:       Case Manager:  Disability Application Date:       Case Worker:   Emergency Contact Information        Contact Information    Name Relation Home Work Mobile   Malloy,Valinda Mother   [phone]   Rouse,Ginger Stepmother   [phone]     Current Medical History  Patient Admitting Diagnosis: 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   History of Present Illness: Bryan [name] a 36 y.o.right handed  malewith morbid obesity on no prescription medications. Per report, patient lived with grandparents, was independent and working full-time prior to admission. Presented 12/01/2016 with sudden onset of headache. No recent fall or trauma reported. No history of seizures. 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 Echocardiogram with ejection fraction of 70% area systolic function was vigorous.Patient developed worsening hypoxemia increasing shortness of  breath requiring intubation. 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 Conchita ParisNundkumar. Hospital course requiring tracheostomy 12/15/2016 per Dr. Pollyann Kennedyosen and speech therapy follow-up with [facility]. Findings of acute DVT left popliteal vein as well as left posterior tibial veins and underwent placement of IVC filter per interventional radiology05/17/2018. Tube feeds for nutritional supportwith diet advanced to dysphagia 3 honey-thick liquids. Speech, physical, and occupational therapy evaluations completed 12/16/2016 with recommendations of physical medicine rehabilitation consult. Patient was admitted for a comprehensive rehabilitation program 12/26/16.  NIH Total: 2  Past Medical History      Past Medical History:  Diagnosis Date  . Complication of anesthesia    mom states they almost lost him but not sure if he was given to much medication  . Varicose veins    mutilple bleeding episodes, bilateral lower extremities)    Family History  family history is not on file.  Prior Rehab/Hospitalizations:  Has the patient had major surgery during 100 days prior to admission? No  Current Medications   Current Facility-Administered Medications:  .  acetaminophen (TYLENOL) tablet 650 mg, 650 mg, Oral, Q4H  PRN, 650 mg at 12/23/16 2309 **OR** acetaminophen (TYLENOL) solution 650 mg, 650 mg, Per Tube, Q4H PRN, 650 mg at 12/18/16 1704 **OR** acetaminophen (TYLENOL) suppository 650 mg, 650 mg, Rectal, Q4H PRN, Julio SicksPool, Henry, MD .  albuterol (PROVENTIL) (2.5 MG/3ML) 0.083% nebulizer solution 2.5 mg, 2.5 mg, Nebulization, Q4H PRN, Lisbeth RenshawNundkumar, Neelesh, MD, 2.5 mg at 12/16/16 0730 .  chlorhexidine (PERIDEX) 0.12 % solution 15 mL, 15 mL, Mouth Rinse, BID, Nelda BucksFeinstein, Daniel J, MD, 15 mL at 12/25/16 2200 .  Chlorhexidine Gluconate Cloth 2 % PADS 6 each, 6 each, Topical, Q0600, Lisbeth RenshawNundkumar, Neelesh, MD, 6 each at 12/26/16 0600 .  feeding supplement (PRO-STAT SUGAR FREE 64) liquid 30 mL, 30 mL, Oral, BID, Lisbeth RenshawNundkumar, Neelesh, MD, 30 mL at 12/25/16 2146 .  hydrALAZINE (APRESOLINE) injection 10 mg, 10 mg, Intravenous, Q6H PRN, Tobey GrimEubanks, Katalina M, NP, 10 mg at 12/09/16 2022 .  lipase/protease/amylase (CREON) capsule 36,000 Units, 36,000 Units, Oral, Once, Nelda BucksFeinstein, Daniel J, MD .  loperamide (IMODIUM) 1 MG/5ML solution 4 mg, 4 mg, Per Tube, PRN, Nelda BucksFeinstein, Daniel J, MD, 4 mg at 12/19/16 1230 .  MEDLINE mouth rinse, 15 mL, Mouth Rinse, q12n4p, Nelda BucksFeinstein, Daniel J, MD, 15 mL at 12/25/16 1107 .  mupirocin ointment (BACTROBAN) 2 % 1 application, 1 application, Nasal, BID, Lisbeth RenshawNundkumar, Neelesh, MD, 1 application at 12/25/16 2300 .  ondansetron (ZOFRAN-ODT) disintegrating tablet 4 mg, 4 mg, Oral, Q6H PRN **OR** ondansetron (ZOFRAN) injection 4 mg, 4 mg, Intravenous, Q6H PRN, Julio SicksPool, Henry, MD .  [DISCONTINUED] pantoprazole (PROTONIX) EC tablet 40 mg, 40 mg, Oral, Daily **OR** pantoprazole sodium (PROTONIX) 40 mg/20 mL oral suspension 40 mg, 40 mg, Per Tube, Daily, Pool, Sherilyn CooterHenry, MD, 40 mg at 12/25/16 1100 .  RESOURCE THICKENUP CLEAR, , Oral, PRN, Lisbeth RenshawNundkumar, Neelesh, MD .  sodium chloride flush (NS) 0.9 % injection 10-40 mL, 10-40 mL, Intracatheter, Q12H, Lisbeth RenshawNundkumar, Neelesh, MD, 10 mL at 12/25/16 2147 .  sodium chloride flush (NS) 0.9 %  injection 10-40 mL, 10-40 mL, Intracatheter, PRN, Lisbeth RenshawNundkumar, Neelesh, MD, 10 mL at 12/23/16 1728 .  sulfamethoxazole-trimethoprim (BACTRIM DS,SEPTRA DS) 800-160 MG per tablet 1 tablet, 1 tablet, Oral, Q12H, Costella, Darci CurrentVincent J, PA-C, 1 tablet at 12/25/16 1731  Patients Current Diet: DIET DYS 3 Room service appropriate? 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  Home Assistive Devices / Equipment Home Assistive Devices/Equipment: None  Prior Device Use: Indicate devices/aids used by the patient prior to current illness, exacerbation or injury? None of the above  Prior Functional Level Prior Function Level of Independence: Independent Comments: Reports working PTA but difficult to get history due to trach.  Self Care: Did the patient need help bathing, dressing, using the toilet or eating?Independent  Indoor Mobility: Did the patient need assistance with walking from room to room (with or without device)? Independent  Stairs: Did the patient need assistance with internal or external stairs (with or without device)? Independent  Functional Cognition: Did the patient need help planning regular tasks such as shopping or remembering to take medications? Independent  Current Functional Level Cognition  Arousal/Alertness: Awake/alert Overall Cognitive Status: Impaired/Different from baseline Difficult to assess due to: Tracheostomy Current Attention Level: Selective Orientation Level: Oriented to person, Oriented to place, Oriented to time, Disoriented to situation Following  Commands: Follows multi-step commands inconsistently Safety/Judgement: Decreased awareness of safety General Comments: PMV in place, pt could not remember if he took the TC off or if the RN took it off.  He at times will say some non-sensical things.  I asked, "Did you have a good lunch?" and he responded with "I didn't drive" and this happened a few times during my session.  Problem Solving: Impaired Problem Solving Impairment: Functional basic    Extremity Assessment (includes Sensation/Coordination)  Upper Extremity Assessment: Generalized weakness  Lower Extremity Assessment: Defer to PT evaluation    ADLs  Overall ADL's : Needs assistance/impaired Eating/Feeding: NPO Eating/Feeding Details (indicate cue type and reason): Pt eager to progress to normal diet  Grooming: Minimal assistance, Sitting, Wash/dry face, Oral care Upper Body Bathing: Moderate assistance, Sitting Lower Body Bathing: Moderate assistance, Sit to/from stand Upper Body Dressing : Maximal assistance, Sitting Lower Body Dressing: Maximal assistance, Sit to/from stand Lower Body Dressing Details (indicate cue type and reason): Pt able to lean forward to mid calf in attempts to doff socks  Toilet Transfer: Moderate assistance, Stand-pivot, BSC Toilet Transfer Details (indicate cue type and reason): Pt fatigued.  Required mod A +2 to move sit to stand (has been in chair all day).  He was incontinent of stool  Toileting- Clothing Manipulation and Hygiene: Total assistance, Sit to/from stand Toileting - Clothing Manipulation Details (indicate cue type and reason): Pt stood x 5 mins for peri care due to bowel incontinence  Functional mobility during ADLs: Moderate assistance, +2 for safety/equipment General ADL Comments: Pt would not engage in ADLs session.  Attempted to establish goals with pt, however, he would not engage stating, you need to stop asking me questions.  Pt's mother arrived.  She reports pt was independent  PTA, lived with grandparents.  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   Pt does indicate he feels frightened by diagnosis, but when I attempted to delve or encourage him, he denied feeling this way.   Spoke with pt and established a plan for OOB tomorrow which he agreed to, however, unsure if he will recall this     Mobility  Overal bed mobility: Needs Assistance Bed Mobility: Supine to Sit Rolling: Max assist, +2 for physical assistance Supine to sit: Min assist, HOB elevated Sit to supine: Min assist General bed mobility comments: Min assist to provide a hand to pull once most of the way up.  Pt using momentum to get to EOB from supine.  HOB only minimally elevated.     Transfers  Overall transfer level: Needs assistance Equipment used: Rolling walker (2 wheeled) Transfers: Sit to/from Stand Sit to Stand: Mod assist Stand pivot transfers: Min assist, +2 physical assistance General transfer comment: mod A to boost into standing     Ambulation / Gait / Stairs / Wheelchair Mobility  Ambulation/Gait Ambulation/Gait assistance: +2 safety/equipment, Min assist Ambulation Distance (Feet): 15 Feet Assistive device: Rolling walker (2 wheeled) Gait Pattern/deviations: Step-through pattern, Trunk flexed General Gait Details: Pt has shoes donned, ambulated with RW to the bathroom where he attempted to stand to urinate.  He did not get urine into the commode and urinated all over the floor of the bathroom.  HR at this point was 140 and O2 sats were down in the 80s on RA with PMV donned.  recliner chair brought to the bathroom door and pt sat down.  TC O2 re-applied, but once sitting pt's sats were back into the 90s before I could get the TC back on him.  He may benefit from [facility] for walking to help maintain sats.  Pt was getting 3/4 DOE with short distance gait and attempting to stand to urinate.  Gait velocity:  decreased Gait velocity interpretation: Below normal speed for age/gender    Posture / Balance Dynamic Sitting Balance Sitting balance - Comments: Pt able to lean forward on EOC to try to access feet to try to remove socks with unilateral UE support   Balance Overall balance assessment: Needs assistance Sitting-balance support: Feet supported, Bilateral upper extremity supported Sitting balance-Leahy Scale: Fair Sitting balance - Comments: Pt able to lean forward on EOC to try to access feet to try to remove socks with unilateral UE support   Postural control: Posterior lean, Left lateral lean Standing balance support: Bilateral upper extremity supported Standing balance-Leahy Scale: Poor Standing balance comment: requires UE support     Special needs/care consideration BiPAP/CPAP: No  CPM: No Continuous Drip IV: No Dialysis: No         Life Vest: No Oxygen: 28% FiO2 via trach collar Special Bed: Bariatric bed with air mattress overlay  Trach Size: 8 XLT cuffed trach with PMSV  Wound Vac (area): No       Skin: WDL, dry                                Bowel mgmt: 5/30 incontinent Bladder mgmt: Foley removed 5/30 monitoring output ? UTI Diabetic mgmt: No     Previous Home Environment Living Arrangements: Parent Available Help at Discharge: Family Home Access: Stairs to enter Entrance Stairs-Number of Steps: could not specify Home Care Services: No  Discharge Living Setting Plans for Discharge Living Setting: Lives with (comment) (Grandparents ) Type of Home at Discharge: House Discharge Home Layout: One level Discharge Home Access: Stairs to enter Entrance Stairs-Rails: None Entrance Stairs-Number of Steps: 2 Discharge Bathroom Shower/Tub: Tub/shower unit, Curtain Discharge Bathroom Toilet: Standard Discharge Bathroom Accessibility: Yes How Accessible: Accessible via walker Does the patient have any problems obtaining your medications?: No  Social/Family/Support  Systems Patient Roles: Other (  Comment) (Son, grandson, sibling) Contact Information: Mom: Bryan [name] [phone] Anticipated Caregiver: Family Anticipated Caregiver's Contact Information: see above  Ability/Limitations of Caregiver: None Caregiver Availability: 24/7 Discharge Plan Discussed with Primary Caregiver: Yes Is Caregiver In Agreement with Plan?: Yes Does Caregiver/Family have Issues with Lodging/Transportation while Pt is in Rehab?: No  Goals/Additional Needs Patient/Family Goal for Rehab: PT Min assist, OT/SLP Supervision-Min assist  Expected length of stay: 20-30 days  Cultural Considerations: Baptist  Dietary Needs: NPO with NG  Equipment Needs: Bariatric equipment needs TBD Special Service Needs: None Additional Information: Mom and family will go to grandparent's house and assist as needed  Pt/Family Agrees to Admission and willing to participate: Yes Program Orientation Provided & Reviewed with Pt/Caregiver Including Roles  & Responsibilities: Yes Additional Information Needs: None Information Needs to be Provided By: None  Decrease burden of Care through IP rehab admission: No  Possible need for SNF placement upon discharge: No  Patient Condition: This patient's medical and functional status has changed since the consult dated: 12/18/16 in which the Rehabilitation Physician determined and documented that the patient's condition is appropriate for intensive rehabilitative care in an inpatient rehabilitation facility. See "History of Present Illness" (above) for medical update. Functional changes are: Mod +2 transfers and gait with EVA walker. Patient's medical and functional status update has been discussed with the Rehabilitation physician and patient remains appropriate for inpatient rehabilitation. Will admit to inpatient rehab today.  Preadmission Screen Completed By:  Fae Pippin, 12/26/2016 10:11  AM ______________________________________________________________________   Discussed status with Dr. Riley Kill on 12/26/16 at 1010 and received telephone approval for admission today.  Admission Coordinator:  Fae Pippin, time 1010/Date 12/26/16       Cosigned by: Ranelle Oyster, MD at 12/26/2016 10:32 AM  Revision History

## 2016-12-26 NOTE — Progress Notes (Signed)
[facility] PHYSICAL MEDICINE & REHABILITATION     PROGRESS NOTE    Subjective/Complaints: Just waking up. Slow to engage  ROS: Limited due cognitive/behavioral   Objective: Vital Signs: Pulse 96, resp. rate 18, SpO2 100 %. No results found.  Recent Labs  12/24/16 1126  WBC 12.9*  HGB 10.1*  HCT 33.4*  PLT 404*   No results for input(s): NA, K, CL, GLUCOSE, BUN, CREATININE, CALCIUM in the last 72 hours.  Invalid input(s): CO CBG (last 3)   Recent Labs  12/26/16 0311 12/26/16 0807 12/26/16 1240  GLUCAP 102* 86 144*    Wt Readings from Last 3 Encounters:  12/25/16 (!) 189.1 kg (417 lb)  02/26/15 (!) 198.7 kg (438 lb)  02/19/15 (!) 198.7 kg (438 lb)    Physical Exam:  Constitutional:  36 y/o right handed obese male. HENT:  Head: Normocephalic.  Sclerae improving Eyes: EOMare normal.  Left crani scar dry and intact Neck:  Trach in place, #8 XL? Some secretions around trach Cardiovascular: RRR Respiratory: no respiratory distress.  GI: Soft. Bowel sounds are normal. He exhibits no distension.  Musculoskeletal: He exhibits no edema.  Skin.Clean and dry Neurological:  Patient makes eye contact with examiner. Improved attention and processing. Followed simple commands, improvedoro-motor control. Moves all 4 limbs---strength 3-4/5 UE's and 2 HF, 3-KE and 3+ ADF/PF. sensespain in all 4. --stable exam Psych :pleasant and cooperative.   Assessment/Plan: 1. Functional deficits secondary to left p-com aneurysm which require 3+ hours per day of interdisciplinary therapy in a comprehensive inpatient rehab setting. Physiatrist is providing close team supervision and 24 hour management of active medical problems listed below. Physiatrist and rehab team continue to assess barriers to discharge/monitor patient progress toward functional and medical goals.  Function:  Bathing Bathing position      Bathing parts      Bathing assist        Upper Body  Dressing/Undressing Upper body dressing                    Upper body assist        Lower Body Dressing/Undressing Lower body dressing                                  Lower body assist        Toileting Toileting          Toileting assist     Transfers Chair/bed transfer             Locomotion Ambulation           Wheelchair          Cognition Comprehension Comprehension assist level: Follows basic conversation/direction with no assist  Expression Expression assist level: Expresses basic 50 - 74% of the time/requires cueing 25 - 49% of the time. Needs to repeat parts of sentences.  Social Interaction Social Interaction assist level: Interacts appropriately 90% of the time - Needs monitoring or encouragement for participation or interaction.  Problem Solving Problem solving assist level: Solves basic 75 - 89% of the time/requires cueing 10 - 24% of the time  Memory Memory assist level: Recognizes or recalls 50 - 74% of the time/requires cueing 25 - 49% of the time  Medical Problem List and Plan: 1. Motor, cognitive, functional deficitssecondary to Ruptured left posterior communicating artery aneurysm status post clipping -begin therapies today 2. DVT Prophylaxis/Anticoagulation: Left popliteal vein as well  as left posterior tibial vein DVT. Status post IVC filter 12/11/2016 per interventional radiology 3. Pain Management: Tylenol as needed. Denies any pain or headache at present 4. Mood: Provide emotional support 5. Neuropsych: This patient iscapable of making decisions on hisown behalf. 6. Skin/Wound Care: Routine skin checks 7. 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 Speech therapy follow-up PMV. Presently with a #8 XLTin place -phonating well. Consider trach downsize on monday 9.Dysphagia. Dysphagia #3 honey thick liquids.  Follow speech therapy. Monitor hydration while on honeys 10.Hypertension. Monitor with increased mobility 11.Morbid obesity. BMI 56.35 kg Dietary follow-up 12.MRSA PCR screen positive. Contact precautions 13.UTI.Empiric Bactrim, UCX pending  LOS (Days) 0 A FACE TO FACE EVALUATION WAS PERFORMED  Ranelle Oyster, MD 12/26/2016 3:50 PM

## 2016-12-26 NOTE — Progress Notes (Signed)
Anticipate DC to CIR today  CSW signing off  Burna SisJenna H. Sione Baumgarten, LCSW Clinical Social Worker [phone]

## 2016-12-26 NOTE — Progress Notes (Signed)
Admit to unit via bed, oriented to unit, schedule and rehab routine. Reviewed orders and plan of care Bryan [name], Bryan [name]

## 2016-12-26 NOTE — Evaluation (Signed)
Speech Language Pathology Assessment and Plan  Patient Details  Name: Bryan [name] MRN: [id number] Date of Birth: 02-18-1981  SLP Diagnosis: Dysphagia;Voice disorder  Rehab Potential: Good ELOS: 14-21 days     Today's Date: 12/26/2016 SLP Individual Time: 3527-0032 SLP Individual Time Calculation (min): 33 min   Problem List:  Patient Active Problem List   Diagnosis Date Noted  . 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 Pressure injury of skin 12/25/2016  . Encounter for orogastric (OG) tube placement   . Bad headache   . Syncope   . Endotracheally intubated   . Hypertension   . Respiratory failure with hypoxia (HCC)   . SAH (subarachnoid hemorrhage) (HCC) 12/01/2016   Past Medical History:  Past Medical History:  Diagnosis Date  . Complication of anesthesia    mom states they almost lost him but not sure if he was given to much medication  . Varicose veins    mutilple bleeding episodes, bilateral lower extremities)   Past Surgical History:  Past Surgical History:  Procedure Laterality Date  . CRANIOTOMY Left 12/02/2016   Procedure: CRANIOTOMY FOR CLIPPING OF INTRACRANIAL ANEURYSM;  Surgeon: Lisbeth Renshaw, MD;  Location: [facility] OR;  Service: Neurosurgery;  Laterality: Left;  . IR FLUORO RM 30-60 MIN  12/02/2016  . IR IVC FILTER PLMT / S&I /IMG GUID/MOD SED  12/11/2016  . IR US GUIDE VASC ACCESS RIGHT  12/02/2016  . lipo around heart  at age 60   pt states its bc they could hear his  heart beat  . ORIF ANKLE FRACTURE Left 09/02/2012   Procedure: OPEN REDUCTION INTERNAL FIXATION (ORIF) ANKLE FRACTURE;  Surgeon: Valeria Batman, MD;  Location: [facility] OR;  Service: Orthopedics;  Laterality: Left;  OPEN REDUCTION INTERNAL FIXATION LEFT DISTAL FIBULA FRACTURE WITH REDUCTION FIXATION DISTAL TIBIA/FIBULAR DIASTASIS   . RADIOLOGY WITH ANESTHESIA N/A 12/02/2016   Procedure: RADIOLOGY WITH ANESTHESIA;  Surgeon: Lisbeth Renshaw, MD;   Location[facility] OR;  Service: Radiology;  Laterality: N/A;  . TRACHEOSTOMY TUBE PLACEMENT N/A 12/15/2016   Procedure: TRACHEOSTOMY;  Surgeon: Serena Colonel, MD;  Location: [facility] OR;  Service: ENT;  Laterality: N/A;    Assessment / Plan / Recommendation Clinical Impression   Bryan [name] is a 36 y.o. right handed male with morbid obesity on no prescription medications. Per chart review patient lives with parent independent prior to admission. Presented 12/01/2016 with sudden onset of headache. No recent fall or trauma reported. No history of seizures. 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 Patient developed worsening hypoxemia increasing shortness of breath requiring intubation. 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 Conchita Paris. Hospital course requiring tracheostomy 12/15/2016 per Dr. Pollyann Kennedy and speech therapy follow-up with [facility].  Tube feeds for nutritional support with diet advanced to dysphagia #3 honey thick liquids.  Physical and occupational therapy evaluations completed 12/16/2016 with recommendations of physical medicine rehabilitation consult.Patient was admitted for a comprehensive rehabilitation program on 12/26/2016.  SLP evaluation completed the the following results:  Pt presents with good toleration of Passy Muir speaking valve with vitals remaining WFL throughout the duration of today's evaluation.  No back pressure was noted during brief periods of valve removal and pt denied and discomfort while wearing valve.  Pt's vocal quality was not  notably wet during evaluation but pt did expectorate a large amount of thick white secretions from trach site during trach care.  Pt's vocal  qualtiy was hoarse with low intensity which impacted intelligibiltiy  at the sentence level.   Pt also presents with s/s of a pharyngeal dysphagia consistent with most recent objective assessment with vocal quality becoming increasingly wet with trials of thin liquids and improving with thickened liquids.  Pt was able to masticate and clear solids from the oral cavity without difficulty or s/s of airway compromise during the swallow.   Pt's toleration of POs and speaking valve is directly impacted by positioning impairments resulting from pt's body habitus.  Pt would benefit from smaller, cuffless trach. Pt would benefit from skilled ST while inpatient in order to maximize functional independence and reduce burden of care prior to discharge.  Anticipate that pt will need 24/7 supervision at discharge in addition to ST follow up at next level of care.     Skilled Therapeutic Interventions          Bedside swallow and Passy Muir speaking valve evaluations completed with results and recommendations reviewed with patient.     SLP Assessment  Patient will need skilled Speech Lanaguage Pathology Services during CIR admission    Recommendations  Patient may use Passy-Muir Speech Valve: During all therapies with supervision;During all waking hours (remove during sleep);During PO intake/meals [facility] Supervision: Intermittent MD: Please consider changing trach tube to : Smaller size;Cuffless SLP Diet Recommendations: Dysphagia 3 (Mech soft);Honey Liquid Administration via: Cup Medication Administration: Whole meds with puree Supervision: Full supervision/cueing for compensatory strategies Compensations: Slow rate;Small sips/bites;Clear throat intermittently Postural Changes and/or Swallow Maneuvers: Seated upright 90 degrees Oral Care Recommendations: Oral care BID Recommendations for Other Services: Neuropsych consult Patient destination: Home Follow up Recommendations: Home Health SLP;Outpatient SLP;24 hour supervision/assistance Equipment Recommended: To be determined     SLP Frequency 3 to 5 out of 7 days   SLP Duration  SLP Intensity  SLP Treatment/Interventions 14-21 days   Minumum of 1-2 x/day, 30 to 90 minutes  Cueing hierarchy;Dysphagia/aspiration precaution training;Patient/family education;Internal/external aids    Pain Pain Assessment Pain Assessment: No/denies pain  Prior Functioning Cognitive/Linguistic Baseline: Within functional limits  Lives With: Family Available Help at Discharge: Family Vocation: Full time employment  Function:  Eating Eating   Modified Consistency Diet: Yes Eating Assist Level: Supervision or verbal cues           Cognition Comprehension Comprehension assist level: Follows basic conversation/direction with no assist  Expression   Expression assist level: Expresses basic 50 - 74% of the time/requires cueing 25 - 49% of the time. Needs to repeat parts of sentences.  Social Interaction Social Interaction assist level: Interacts appropriately 90% of the time - Needs monitoring or encouragement for participation or interaction.  Problem Solving Problem solving assist level: Solves basic 75 - 89% of the time/requires cueing 10 - 24% of the time  Memory Memory assist level: Recognizes or recalls 50 - 74% of the time/requires cueing 25 - 49% of the time   Short Term Goals: Week 1: SLP Short Term Goal 1 (Week 1): Pt will consume dys 3 textures and honey thick liquids with supervision cues for use of swallowing precautions.  SLP Short Term Goal 2 (Week 1): Pt will consume therapeutic trials of ice chips to continue working towards repeat objective assessment with supervision cues for use of swallowing precautions.  SLP Short Term Goal 3 (Week 1): Pt will return demonstration  of safe speaking valve use with min question cues.   SLP Short Term Goal 4 (Week 1): Pt will increase vocal intensity to achieve intelligibility in sentences with min assist.    Refer to Care Plan for Long Term Goals  Recommendations  for other services: Neuropsych  Discharge Criteria: Patient will be discharged from SLP if patient refuses treatment 3 consecutive times without medical reason, if treatment goals not met, if there is a change in medical status, if patient makes no progress towards goals or if patient is discharged from hospital.  The above assessment, treatment plan, treatment alternatives and goals were discussed and mutually agreed upon: by patient  Emilio Math 12/26/2016, 3:40 PM

## 2016-12-26 NOTE — H&P (Signed)
Physical Medicine and Rehabilitation Admission H&P       Chief Complaint  Patient presents with  . Headache  : HPI: Bryan [name] a 36 y.o.right handed malewith morbid obesity on no prescription medications. Per chart review patient lives with parent independent prior to admission. Presented 12/01/2016 with sudden onset of headache. No recent fall or trauma reported. No history of seizures. 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 Patient developed worsening hypoxemia increasing shortness of breath requiring intubation. 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 Conchita Paris. Hospital course requiring tracheostomy 12/15/2016 per Dr. Pollyann Kennedy and speech therapy follow-up with [facility]. Findings of acute DVT left popliteal vein as well as left posterior tibial veins and underwent placement of IVC filter per interventional radiology05/17/2018.  Tube feeds for nutritional support with diet advanced to dysphagia #3 honey thick liquids.MRSA PCR screen positive placed on contact precautions. Urine study with many bacteria, nitrite negative placed on Bactrim empirically. Physical and occupational therapy evaluations completed 12/16/2016 with recommendations of physical medicine rehabilitation consult.Patient was admitted for a comprehensive rehabilitation program  Review of Systems  Constitutional: Negative for chills and fever.  HENT: Negative for hearing loss.   Eyes: Negative for blurred vision and double vision.  Respiratory: Positive for cough and shortness of breath.   Cardiovascular: Positive for leg swelling. Negative for chest pain and palpitations.    Gastrointestinal: Positive for constipation. Negative for nausea and vomiting.  Genitourinary: Negative for dysuria, flank pain and hematuria.  Musculoskeletal: Positive for myalgias.  Skin: Negative for rash.  Neurological: Positive for dizziness and weakness.  All other systems reviewed and are negative.      Past Medical History:  Diagnosis Date  . Complication of anesthesia    mom states they almost lost him but not sure if he was given to much medication  . Varicose veins    mutilple bleeding episodes, bilateral lower extremities)        Past Surgical History:  Procedure Laterality Date  . CRANIOTOMY Left 12/02/2016   Procedure: CRANIOTOMY FOR CLIPPING OF INTRACRANIAL ANEURYSM;  Surgeon: Lisbeth Renshaw, MD;  Location: [facility] OR;  Service: Neurosurgery;  Laterality: Left;  . IR FLUORO RM 30-60 MIN  12/02/2016  . IR IVC FILTER PLMT / S&I /IMG GUID/MOD SED  12/11/2016  . IR US GUIDE VASC ACCESS RIGHT  12/02/2016  . lipo around heart  at age 77   pt states its bc they could hear his  heart beat  . ORIF ANKLE FRACTURE Left 09/02/2012   Procedure: OPEN REDUCTION INTERNAL FIXATION (ORIF) ANKLE FRACTURE;  Surgeon: Valeria Batman, MD;  Location: [facility] OR;  Service: Orthopedics;  Laterality: Left;  OPEN REDUCTION INTERNAL FIXATION LEFT DISTAL FIBULA FRACTURE WITH REDUCTION FIXATION DISTAL TIBIA/FIBULAR DIASTASIS   . RADIOLOGY WITH ANESTHESIA N/A 12/02/2016   Procedure: RADIOLOGY WITH ANESTHESIA;  Surgeon: Lisbeth Renshaw, MD;  Location[facility];  Service: Radiology;  Laterality: N/A;  . TRACHEOSTOMY TUBE PLACEMENT N/A 12/15/2016   Procedure: TRACHEOSTOMY;  Surgeon: Serena Colonel, MD;  Location: [facility] OR;  Service: ENT;  Laterality: N/A;   No family history on file. Social History:  reports that he has never smoked. His smokeless tobacco use includes Chew. He reports that he drinks alcohol.  He reports that he does not use drugs. Allergies:  Allergies  Allergen Reactions  .  Penicillins Hives and Other (See Comments)    Mouth blisters - childhood allergy Has patient had a PCN reaction causing immediate rash, facial/tongue/throat swelling, SOB or lightheadedness with hypotension: Yes Has patient had a PCN reaction causing severe rash involving mucus membranes or skin necrosis: No Has patient had a PCN reaction that required hospitalization Yes Has patient had a PCN reaction occurring within the last 10 years: No If all of the above answers are "NO", then may proceed with Cephalosporin use.         Medications Prior to Admission  Medication Sig Dispense Refill  . oxyCODONE-acetaminophen (PERCOCET/ROXICET) 5-325 MG per tablet Take 1-2 tablets by mouth every 4 (four) hours as needed. (Patient not taking: Reported on 01/12/2015) 60 tablet 0    Home: Home Living Family/patient expects to be discharged to:: Private residence Living Arrangements: Parent Available Help at Discharge: Family Home Access: Stairs to enter Entrance Stairs-Number of Steps: could not specify   Functional History: Prior Function Level of Independence: Independent Comments: Reports working PTA but difficult to get history due to trach.  Functional Status:  Mobility: Bed Mobility Overal bed mobility: Needs Assistance Bed Mobility: Sit to Supine Rolling: Max assist, +2 for physical assistance Supine to sit: Min assist, +2 for physical assistance, HOB elevated Sit to supine: Min assist General bed mobility comments: min A for LEs  Transfers Overall transfer level: Needs assistance Equipment used: 4-wheeled walker (EVA walker ) Transfers: Sit to/from Stand, [employer] CorporationStand Pivot Transfers Sit to Stand: Mod assist, +2 physical assistance Stand pivot transfers: Min assist, +2 physical assistance General transfer comment: assist to lift up into standing and assist for balance  Ambulation/Gait General Gait Details: attempt when pt has shoes  ADL: ADL Overall ADL's : Needs  assistance/impaired Eating/Feeding: NPO Eating/Feeding Details (indicate cue type and reason): Pt eager to progress to normal diet  Grooming: Minimal assistance, Sitting, Wash/dry face, Oral care Upper Body Bathing: Moderate assistance, Sitting Lower Body Bathing: Maximal assistance, +2 for safety/equipment, Sit to/from stand Upper Body Dressing : Maximal assistance, Sitting Lower Body Dressing: Total assistance, Sit to/from stand Lower Body Dressing Details (indicate cue type and reason): Pt able to lean forward to mid calf in attempts to doff socks  Toilet Transfer: Moderate assistance, +2 for physical assistance, Stand-pivot, BSC, RW Toilet Transfer Details (indicate cue type and reason): Pt fatigued.  Required mod A +2 to move sit to stand (has been in chair all day).  He was incontinent of stool  Toileting- Clothing Manipulation and Hygiene: Total assistance, Sit to/from stand Toileting - Clothing Manipulation Details (indicate cue type and reason): Pt stood x 5 mins for peri care due to bowel incontinence  Functional mobility during ADLs: Moderate assistance, +2 for physical assistance General ADL Comments: Pt would not engage in ADLs session.  Attempted to establish goals with pt, however, he would not engage stating, you need to stop asking me questions.  Pt's mother arrived.  She reports pt was independent PTA, lived with grandparents.  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   Pt does indicate he feels frightened by diagnosis, but when I attempted to delve or encourage him, he denied feeling this way.   Spoke with pt and established a plan for OOB tomorrow which he agreed to, however, unsure if he will recall this  Cognition: Cognition Overall Cognitive Status: Impaired/Different from baseline Arousal/Alertness: Awake/alert Orientation Level: Intubated/Tracheostomy - Unable to assess Problem  Solving: Impaired Problem Solving Impairment: Functional basic Cognition Arousal/Alertness: Awake/alert Behavior During Therapy: Flat affect Overall Cognitive Status: Impaired/Different from baseline Area of Impairment: Attention, Following commands, Safety/judgement, Awareness, Problem solving Orientation Level: Disoriented to, Time Current Attention Level: Sustained Memory: Decreased short-term memory Following Commands: Follows one step commands consistently, Follows multi-step commands inconsistently Safety/Judgement: Decreased awareness of safety, Decreased awareness of deficits Awareness: Intellectual Problem Solving: Slow processing, Decreased initiation, Difficulty sequencing, Requires verbal cues, Requires tactile cues General Comments: PMV in place.   Pt more interactive today, and smiling at times.  Became agitated by pain in feet caused by socks.  Initially sat refusing to progress to OOB, but did so with encouragement.  However, as discomfort and pain increased again with standing, he became agitated with his mother pushing her.  Difficult to assess due to: Tracheostomy  Physical Exam: Blood pressure 108/69, pulse 90, temperature 98.5 F (36.9 C), temperature source Oral, resp. rate 16, height 5\' 11"  (1.803 m), weight (!) 189.1 kg (417 lb), SpO2 97 %. Physical Exam  Constitutional:  36 y/o right handed obese male.  HENT:  Head: Normocephalic.  Sclera less injected  Eyes: EOM are normal.  Left crani scar dry and intact  Neck:  Trach in place, #8 XL?Marland Kitchen Phonates well with PMV  Cardiovascular: Normal rate and regular rhythm.  Exam reveals no gallop and no friction rub.   No murmur heard. Respiratory: No respiratory distress. He has no wheezes. He has no rales.  Decreased breath sounds at the bases but clear to auscultation  GI: Soft. Bowel sounds are normal. He exhibits no distension.  Musculoskeletal: He exhibits no edema.  Skin. Clean and dry Neurological:  Patient  makes eye contact with examiner. Improved attention and processing.  Followed simple commands, improved oro-motor control. Moves all 4 limbs---strength 3-4/5 UE's and 2 HF, 3-KE and 3+ ADF/PF. senses pain in all 4. Emerging insight and awareness Psych :pleasant and cooperative.    Lab Results Last 48 Hours       Results for orders placed or performed during the hospital encounter of 12/01/16 (from the past 48 hour(s))  Glucose, capillary     Status: Abnormal   Collection Time: 12/23/16  9:40 AM  Result Value Ref Range   Glucose-Capillary 108 (H) 65 - 99 mg/dL  Glucose, capillary     Status: Abnormal   Collection Time: 12/23/16 12:21 PM  Result Value Ref Range   Glucose-Capillary 103 (H) 65 - 99 mg/dL  Glucose, capillary     Status: Abnormal   Collection Time: 12/23/16  4:38 PM  Result Value Ref Range   Glucose-Capillary 139 (H) 65 - 99 mg/dL  Glucose, capillary     Status: Abnormal   Collection Time: 12/23/16  8:54 PM  Result Value Ref Range   Glucose-Capillary 129 (H) 65 - 99 mg/dL  Glucose, capillary     Status: Abnormal   Collection Time: 12/24/16 12:01 AM  Result Value Ref Range   Glucose-Capillary 141 (H) 65 - 99 mg/dL  Glucose, capillary     Status: Abnormal   Collection Time: 12/24/16  4:13 AM  Result Value Ref Range   Glucose-Capillary 129 (H) 65 - 99 mg/dL  Glucose, capillary     Status: Abnormal   Collection Time: 12/24/16  8:09 AM  Result Value Ref Range   Glucose-Capillary 185 (H) 65 - 99 mg/dL  CBC  Status: Abnormal   Collection Time: 12/24/16 11:26 AM  Result Value Ref Range   WBC 12.9 (H) 4.0 - 10.5 K/uL   RBC 3.72 (L) 4.22 - 5.81 MIL/uL   Hemoglobin 10.1 (L) 13.0 - 17.0 g/dL   HCT 96.0 (L) 45.4 - 09.8 %   MCV 89.8 78.0 - 100.0 fL   MCH 27.2 26.0 - 34.0 pg   MCHC 30.2 30.0 - 36.0 g/dL   RDW 11.9 14.7 - 82.9 %   Platelets 404 (H) 150 - 400 K/uL  Glucose, capillary     Status: Abnormal   Collection Time: 12/24/16  1:14 PM    Result Value Ref Range   Glucose-Capillary 126 (H) 65 - 99 mg/dL  Urinalysis, Routine w reflex microscopic     Status: Abnormal   Collection Time: 12/24/16  3:41 PM  Result Value Ref Range   Color, Urine YELLOW YELLOW   APPearance TURBID (A) CLEAR   Specific Gravity, Urine 1.025 1.005 - 1.030   pH 6.5 5.0 - 8.0   Glucose, UA NEGATIVE NEGATIVE mg/dL   Hgb urine dipstick LARGE (A) NEGATIVE   Bilirubin Urine NEGATIVE NEGATIVE   Ketones, ur NEGATIVE NEGATIVE mg/dL   Protein, ur 562 (A) NEGATIVE mg/dL   Nitrite POSITIVE (A) NEGATIVE   Leukocytes, UA MODERATE (A) NEGATIVE  Urinalysis, Microscopic (reflex)     Status: Abnormal   Collection Time: 12/24/16  3:41 PM  Result Value Ref Range   RBC / HPF 6-30 0 - 5 RBC/hpf   WBC, UA 6-30 0 - 5 WBC/hpf   Bacteria, UA MANY (A) NONE SEEN   Squamous Epithelial / LPF 0-5 (A) NONE SEEN   Granular Casts, UA PRESENT    Urine-Other URIC ACID CRYSTALS   Glucose, capillary     Status: Abnormal   Collection Time: 12/24/16  3:44 PM  Result Value Ref Range   Glucose-Capillary 146 (H) 65 - 99 mg/dL  Glucose, capillary     Status: Abnormal   Collection Time: 12/24/16  7:54 PM  Result Value Ref Range   Glucose-Capillary 131 (H) 65 - 99 mg/dL   Comment 1 Notify RN    Comment 2 Document in Chart   Glucose, capillary     Status: Abnormal   Collection Time: 12/24/16 11:49 PM  Result Value Ref Range   Glucose-Capillary 104 (H) 65 - 99 mg/dL   Comment 1 Notify RN    Comment 2 Document in Chart   Glucose, capillary     Status: Abnormal   Collection Time: 12/25/16  4:08 AM  Result Value Ref Range   Glucose-Capillary 122 (H) 65 - 99 mg/dL   Comment 1 Notify RN       Imaging Results (Last 48 hours)  Dg Chest Port 1 View  Result Date: 12/24/2016 CLINICAL DATA:  Increased tracheal secretions.  Fever. EXAM: PORTABLE CHEST 1 VIEW COMPARISON:  Ten days prior FINDINGS: Tracheostomy tube remains well seated. Right  upper extremity PICC with tip at the SVC level. Low volumes with interstitial crowding. There is no edema, consolidation, effusion, or pneumothorax. Chronic cardiomegaly. IMPRESSION: 1. Stable chest.  No evidence of pneumonia. 2. Cardiomegaly. Electronically Signed   By: Marnee Spring M.D.   On: 12/24/2016 09:58        Medical Problem List and Plan: 1.  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  DVT Prophylaxis/Anticoagulation: Left popliteal vein as well as left posterior tibial vein DVT. Status post IVC filter 12/11/2016 per interventional radiology 3. Pain Management: Tylenol as needed. Denies any pain or headache at present 4. Mood: Provide emotional support 5. Neuropsych: This patient is capable of making decisions on his own behalf. 6. Skin/Wound Care: Routine skin checks 7. Fluids/Electrolytes/Nutrition: Routine I&O with follow-up chemistries             -encourage PO 8. Tracheostomy 12/15/2016. Speech therapy follow-up PMV. Presently with a #8 XLT in place             -phonating well. Probably can downsize on monday 9. Dysphagia. Dysphagia #3 honey thick liquids. Follow speech therapy. Monitor hydration while on honeys 10. Hypertension. Monitor with increased mobility 11. Morbid obesity. BMI 56.35 kg Dietary follow-up 12. MRSA PCR screen positive. Contact precautions 13.UTI. Empiric Bactrim  Post Admission Physician Evaluation: 1. Functional deficits secondary  to ruptured left p-com aneurysm. 2. Patient is admitted to receive collaborative, interdisciplinary care between the physiatrist, rehab nursing staff, and therapy team. 3. Patient's level of medical complexity and substantial therapy needs in context of that medical necessity cannot be provided at a lesser intensity of care such as a SNF. 4. Patient has experienced substantial functional loss  from his/her baseline which was documented above under the "Functional History" and "Functional Status" headings.  Judging by the patient's diagnosis, physical exam, and functional history, the patient has potential for functional progress which will result in measurable gains while on inpatient rehab.  These gains will be of substantial and practical use upon discharge  in facilitating mobility and self-care at the household level. 5. Physiatrist will provide 24 hour management of medical needs as well as oversight of the therapy plan/treatment and provide guidance as appropriate regarding the interaction of the two. 6. The Preadmission Screening has been reviewed and patient status is unchanged unless otherwise stated above. 7. 24 hour rehab nursing will assist with bladder management, bowel management, safety, skin/wound care, disease management, medication administration, pain management and patient education  and help integrate therapy concepts, techniques,education, etc. 8. PT will assess and treat for/with: Lower extremity strength, range of motion, stamina, balance, functional mobility, safety, adaptive techniques and equipment, NMR, visual-spatial awareness, family ed.   Goals are: supervision to mod I. 9. OT will assess and treat for/with: ADL's, functional mobility, safety, upper extremity strength, adaptive techniques and equipment, NMR, visual-spatial awareness, family ed.   Goals are: supervision to mod I. Therapy may proceed with showering this patient. 10. SLP will assess and treat for/with: cognition, communication, swallowing, speech.  Goals are: mod I. 11. Case Management and Social Worker will assess and treat for psychological issues and discharge planning. 12. Team conference will be held weekly to assess progress toward goals and to determine barriers to discharge. 13. Patient will receive at least 3 hours of therapy per day at least 5 days per week. 14. ELOS: 15-18 days        15. Prognosis:  excellent     Ranelle Oyster, MD, [facility] Physical Medicine & Rehabilitation 12/26/2016  Charlton Amor., PA-C 12/25/2016

## 2016-12-26 NOTE — Care Management Note (Signed)
Case Management Note  Patient Details  Name: Kem KaysDarren D Calvillo MRN: [id number] Date of Birth: 31-Mar-1981  Subjective/Objective:    Patient for dc to CIR today                Action/Plan:   Expected Discharge Date:  12/26/16               Expected Discharge Plan:  IP Rehab Facility  In-House Referral:     Discharge planning Services  CM Consult  Post Acute Care Choice:    Choice offered to:     DME Arranged:    DME Agency:     HH Arranged:    HH Agency:     Status of Service:  Completed, signed off  If discussed at [employer] Length of Stay Meetings, dates discussed:    Additional Comments:  Leone Havenaylor, Jamariyah Johannsen [place], RN 12/26/2016, 11:17 AM

## 2016-12-26 NOTE — Progress Notes (Signed)
Provided hand off report to inpatient (IP) rehab.  Patient was discharged to IP rehab with belongings, shoes, pillow, cell phone and charger.

## 2016-12-27 ENCOUNTER — Inpatient Hospital Stay (HOSPITAL_COMMUNITY): Payer: BLUE CROSS/BLUE SHIELD | Admitting: Speech Pathology

## 2016-12-27 ENCOUNTER — Inpatient Hospital Stay (HOSPITAL_COMMUNITY): Payer: BLUE CROSS/BLUE SHIELD | Admitting: Physical Therapy

## 2016-12-27 ENCOUNTER — Inpatient Hospital Stay (HOSPITAL_COMMUNITY): Payer: BLUE CROSS/BLUE SHIELD

## 2016-12-27 DIAGNOSIS — R1312 Dysphagia, oropharyngeal phase: Secondary | ICD-10-CM

## 2016-12-27 DIAGNOSIS — Z930 Tracheostomy status: Secondary | ICD-10-CM

## 2016-12-27 LAB — URINE CULTURE: Special Requests: NORMAL

## 2016-12-27 MED ORDER — PANTOPRAZOLE SODIUM 40 MG PO TBEC
40.0000 mg | DELAYED_RELEASE_TABLET | Freq: Every day | ORAL | Status: DC
  Administered 2016-12-27 – 2017-01-07 (×12): 40 mg via ORAL
  Filled 2016-12-27 (×12): qty 1

## 2016-12-27 MED ORDER — SODIUM CHLORIDE 0.9% FLUSH
10.0000 mL | INTRAVENOUS | Status: DC | PRN
  Administered 2016-12-27 – 2016-12-30 (×5): 10 mL
  Administered 2016-12-31: 20 mL
  Administered 2017-01-01: 10 mL
  Filled 2016-12-27 (×7): qty 40

## 2016-12-27 NOTE — Progress Notes (Signed)
Speech Language Pathology Daily Session Note  Patient Details  Name: Bryan [name] MRN: [id number] Date of Birth: 1981-01-05  Today's Date: 12/27/2016 SLP Individual Time: [id number] SLP Individual Time Calculation (min): 45 min  Short Term Goals: Week 1: SLP Short Term Goal 1 (Week 1): Pt will consume dys 3 textures and honey thick liquids with supervision cues for use of swallowing precautions.  SLP Short Term Goal 2 (Week 1): Pt will consume therapeutic trials of ice chips to continue working towards repeat objective assessment with supervision cues for use of swallowing precautions.  SLP Short Term Goal 3 (Week 1): Pt will return demonstration of safe speaking valve use with min question cues.   SLP Short Term Goal 4 (Week 1): Pt will increase vocal intensity to achieve intelligibility in sentences with min assist.   SLP Short Term Goal 5 (Week 1): Pt will Pt will increase functional cognitive skills for short term recall of info at the word/sentence/paragraph level after 5 minutes with 90% acc given min cues.  SLP Short Term Goal 6 (Week 1): Pt will increase funtional cognive abstract reasoning/mental flexibility skills for providing 3 appropriate answers to for vairous safety awareness scenarios with 90% acc with min assist.   Skilled Therapeutic Interventions: Pt was seen for Cognitive Eval and PMV tx in am in a semi distracting environment. Pt's nurse had removed speaking valve while pt was sleeping. Without PMV in place, pt has strained harsh diminished voicing. PMV was placed on pt with no change in O2 sats. (99-97%) Pt's voicing improved and he was able to expectorate orally. Pt would likely benefit from a cuffless trach. Pt is able to produce sustained speech at the 2-3 word level. Educated pt on diaphramatic breathing ex. Pt was also given the MOCA 7.1, scoring 20/30 possible points indicating cognitive deficit. Pt's main deficits are in short term and immediate memory, attention and  problem solving. These will be addressed further in therapy.      Function:  Eating Eating   Modified Consistency Diet: Yes Eating Assist Level: Supervision or verbal cues           Cognition Comprehension Comprehension assist level: Follows basic conversation/direction with no assist  Expression   Expression assist level: Expresses basic 50 - 74% of the time/requires cueing 25 - 49% of the time. Needs to repeat parts of sentences.  Social Interaction Social Interaction assist level: Interacts appropriately 90% of the time - Needs monitoring or encouragement for participation or interaction.  Problem Solving Problem solving assist level: Solves basic 50 - 74% of the time/requires cueing 25 - 49% of the time  Memory Memory assist level: Recognizes or recalls 25 - 49% of the time/requires cueing 50 - 75% of the time    Pain Pain Assessment Pain Assessment: No/denies pain  Therapy/Group: Individual Therapy  Isaac LaudJulie G Khyron Garno 12/27/2016, 11:17 AM

## 2016-12-27 NOTE — Evaluation (Signed)
Physical Therapy Assessment and Plan  Patient Details  Name: Bryan [name] MRN: [id number] Date of Birth: [birth year]  PT Diagnosis: Abnormality of gait, Cognitive deficits and Impaired cognition Rehab Potential: Good ELOS: 12-14days    Today's Date: 12/27/2016 PT Individual Time: 6712-4580 PT Individual Time Calculation (min): 55 min    Problem List:  Patient Active Problem List   Diagnosis Date Noted  . Tracheostomy status ([place])   . Oropharyngeal dysphagia   . 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 Pressure injury of skin 12/25/2016  . Encounter for orogastric (OG) tube placement   . Bad headache   . Syncope   . Endotracheally intubated   . Essential hypertension   . Respiratory failure with hypoxia ([place])   . SAH (subarachnoid hemorrhage) ([place]) 12/01/2016    Past Medical History:  Past Medical History:  Diagnosis Date  . Complication of anesthesia    mom states they almost lost him but not sure if he was given to much medication  . Varicose veins    mutilple bleeding episodes, bilateral lower extremities)   Past Surgical History:  Past Surgical History:  Procedure Laterality Date  . CRANIOTOMY Left 12/02/2016   Procedure: CRANIOTOMY FOR CLIPPING OF INTRACRANIAL ANEURYSM;  Surgeon: Consuella Lose, MD;  Location: [place];  Service: Neurosurgery;  Laterality: Left;  . IR FLUORO RM 30-60 MIN  12/02/2016  . IR IVC FILTER PLMT / S&I /IMG GUID/MOD SED  12/11/2016  . IR US GUIDE VASC ACCESS RIGHT  12/02/2016  . lipo around heart  at age 19   pt states its bc they could hear his  heart beat  . ORIF ANKLE FRACTURE Left 09/02/2012   Procedure: OPEN REDUCTION INTERNAL FIXATION (ORIF) ANKLE FRACTURE;  Surgeon: Garald Balding, MD;  Location: [place];  Service: Orthopedics;  Laterality: Left;  OPEN REDUCTION INTERNAL FIXATION LEFT DISTAL FIBULA FRACTURE WITH REDUCTION FIXATION DISTAL TIBIA/FIBULAR DIASTASIS   . RADIOLOGY WITH  ANESTHESIA N/A 12/02/2016   Procedure: RADIOLOGY WITH ANESTHESIA;  Surgeon: Consuella Lose, MD;  Location: [place];  Service: Radiology;  Laterality: N/A;  . TRACHEOSTOMY TUBE PLACEMENT N/A 12/15/2016   Procedure: TRACHEOSTOMY;  Surgeon: Izora Gala, MD;  Location: [place];  Service: ENT;  Laterality: N/A;    Assessment & Plan Clinical Impression: Patient is a 36 y.o.right handed malewith morbid obesity on no prescription medications. Per chart review patient lives with parent independent prior to admission. Presented 12/01/2016 with sudden onset of headache. No recent fall or trauma reported. No history of seizures. CT angiogram of the head showed diffuse subarachnoid hemorrhage over both convex cities and extending into the basal cisterns, there was an inferiorly and laterally directed aneurysm arising from the communicating segment of the left ICA.Echocardiogram with ejection fraction of 99% area systolic function was vigorous.Patient developed worsening hypoxemia increasing shortness of breath requiring intubation. 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 Kathyrn Sheriff. Hospital course requiring tracheostomy 12/15/2016 per Dr. Constance Holster and speech therapy follow-up with [facility]. Findings of acute DVT left popliteal vein as well as left posterior tibial veins and underwent placement of IVC filter per interventional radiology05/17/2018. Tube feeds for nutritional supportwith diet advanced to dysphagia #3 honey thick liquids.MRSA PCR screen positive placed on contact precautions. Urine study with many bacteria, nitrite negative placed on Bactrim empirically.  Patient transferred to CIR on 12/26/2016 .   Patient currently requires min with mobility secondary to muscle weakness, decreased cardiorespiratoy endurance  and decreased oxygen support, decreased coordination, decreased safety awareness and decreased memory and decreased balance strategies.  Prior to  hospitalization, patient was modified independent  with mobility and lived with Alone (pt reporting he lives alone) in a House home.  Home access is  Stairs to enter.  Patient will benefit from skilled PT intervention to maximize safe functional mobility, minimize fall risk and decrease caregiver burden for planned discharge home with intermittent assist.  Anticipate patient will benefit from follow up [facility] at discharge.  PT - End of Session Activity Tolerance: Tolerates < 10 min activity, no significant change in vital signs Endurance Deficit: Yes PT Assessment Rehab Potential (ACUTE/IP ONLY): Good Barriers to Discharge: Inaccessible home environment;Decreased caregiver support PT Patient demonstrates impairments in the following area(s): Balance;Endurance;Motor;Perception;Safety;Sensory;Skin Integrity;Nutrition;Edema;Pain PT Transfers Functional Problem(s): Bed Mobility;Bed to Chair;Car;Furniture;Floor PT Locomotion Functional Problem(s): Ambulation;Wheelchair Mobility;Stairs PT Plan PT Intensity: Minimum of 1-2 x/day ,45 to 90 minutes PT Frequency: 5 out of 7 days PT Duration Estimated Length of Stay: 12-14days  PT Treatment/Interventions: Training and development officer;Ambulation/gait training;Cognitive remediation/compensation;Community reintegration;Discharge planning;Disease management/prevention;DME/adaptive equipment instruction;Functional mobility training;Neuromuscular re-education;Pain management;Patient/family education;Psychosocial support;Skin care/wound management;Stair training;Therapeutic Activities;Therapeutic Exercise;UE/LE Strength taining/ROM;UE/LE Coordination activities;Wheelchair propulsion/positioning;Visual/perceptual remediation/compensation PT Transfers Anticipated Outcome(s): Mod I with LRAD PT Locomotion Anticipated Outcome(s): Mod I with LRAD for household distances PT Recommendation Recommendations for Other Services: Neuropsych consult Follow Up Recommendations:  Home health PT Patient destination: Home Equipment Recommended: Rolling walker with 5" wheels  Skilled Therapeutic Intervention PT instructed patient in PT Evaluation and initiated treatment intervention; see below for results. PT educated patient in [place], rehab potential, rehab goals, and discharge recommendations. Pt returned to room and left sitting in [facility] with call bell in reach and all needs met.    PT Evaluation Precautions/Restrictions   Trach. Fall.  General   Vital SignsTherapy Vitals Temp: 98.1 F (36.7 C) Temp Source: Oral Pulse Rate: 93 Resp: 18 BP: 131/74 Patient Position (if appropriate): Sitting Oxygen Therapy SpO2: 100 % O2 Device: Tracheostomy Collar O2 Flow Rate (L/min): 6 L/min FiO2 (%): 28 % Pain   0/10  Home Living/Prior Functioning Home Living Available Help at Discharge: Family Type of Home: House Home Access: Stairs to enter Home Layout: One level Bathroom Shower/Tub: Chiropodist: Standard  Lives With: Alone (pt reporting he lives alone) Prior Function Level of Independence: Independent with basic ADLs  Able to Take Stairs?: Yes Driving: Yes Vocation: Full time employment Comments: Reports working PTA but difficult to get history due to trach. Vision/Perception  Vision - Assessment Ocular Range of Motion: Within Functional Limits Alignment/Gaze Preference: Within Defined Limits Perception Perception: Within Functional Limits Praxis Praxis: Intact  Cognition Overall Cognitive Status: Impaired/Different from baseline Arousal/Alertness: Awake/alert Attention: Sustained Sustained Attention: Appears intact Memory: Impaired Memory Impairment: Decreased recall of new information Awareness: Impaired Awareness Impairment: Emergent impairment Safety/Judgment: Impaired Sensation Sensation Light Touch: Appears Intact Proprioception: Appears Intact Coordination Gross Motor Movements are Fluid and Coordinated: No Fine Motor  Movements are Fluid and Coordinated: Yes Motor  Motor Motor: Other (comment) Motor - Skilled Clinical Observations: Generalized deconditioning.   Mobility Bed Mobility Bed Mobility: Rolling Right;Rolling Left;Sit to Supine;Supine to Sit Rolling Right: 5: Supervision;With rail Rolling Left: 5: Supervision;With rail Supine to Sit: 5: Supervision;With rails Transfers Transfers: Yes Sit to Stand: 5: Supervision (with RW) Stand Pivot Transfers: 5: Supervision (with RW ) Locomotion  Ambulation Ambulation: Yes Ambulation/Gait Assistance: 5: Supervision Ambulation Distance (Feet): 5 Feet Assistive device: Rolling walker Ambulation/Gait Assistance Details: Verbal cues for gait pattern;Verbal  cues for precautions/safety;Verbal cues for safe use of DME/AE Stairs / Additional Locomotion Stairs: No Wheelchair Mobility Wheelchair Mobility: No  Trunk/Postural Assessment  Cervical Assessment Cervical Assessment: Exceptions to [facility] (trach) Thoracic Assessment Thoracic Assessment: Exceptions to [place] (rounded shoulders) Lumbar Assessment Lumbar Assessment: Exceptions to [facility] (posterior pelvic tilt) Postural Control Postural Control: Within Functional Limits  Balance Balance Balance Assessed: Yes Dynamic Sitting Balance Dynamic Sitting - Balance Support: No upper extremity supported Dynamic Sitting - Level of Assistance: 5: Stand by assistance Static Standing Balance Static Standing - Balance Support: Bilateral upper extremity supported Static Standing - Level of Assistance: 5: Stand by assistance (through functional movement in room ) Extremity Assessment      RLE Assessment RLE Assessment: Exceptions to [facility] (grossly 4+/5 proximal to distal through functional movement. difficulty to formally assess due to lethargy and body habitus. ) LLE Assessment LLE Assessment: Exceptions to [facility] (grossly 4/5 proximal to distal through functional movement. difficulty to formally assess due to lethargy  and body habitus.)   See Function Navigator for Current Functional Status.   Refer to Care Plan for Long Term Goals  Recommendations for other services: Neuropsych  Discharge Criteria: Patient will be discharged from PT if patient refuses treatment 3 consecutive times without medical reason, if treatment goals not met, if there is a change in medical status, if patient makes no progress towards goals or if patient is discharged from hospital.  The above assessment, treatment plan, treatment alternatives and goals were discussed and mutually agreed upon: by patient  Lorie Phenix 12/27/2016, 5:51 PM

## 2016-12-27 NOTE — Evaluation (Signed)
Occupational Therapy Assessment and Plan  Patient Details  Name: Bryan [name] MRN: [id number] Date of Birth: 14-Aug-1980  OT Diagnosis: abnormal posture, cognitive deficits and muscle weakness (generalized) Rehab Potential:   ELOS: 10-14   Today's Date: 12/27/2016 OT Individual Time: 3875-6433 OT Individual Time Calculation (min): 72 min     Problem List:  Patient Active Problem List   Diagnosis Date Noted  . Tracheostomy status ([place])   . Oropharyngeal dysphagia   . 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 Pressure injury of skin 12/25/2016  . Encounter for orogastric (OG) tube placement   . Bad headache   . Syncope   . Endotracheally intubated   . Essential hypertension   . Respiratory failure with hypoxia ([place])   . SAH (subarachnoid hemorrhage) ([place]) 12/01/2016    Past Medical History:  Past Medical History:  Diagnosis Date  . Complication of anesthesia    mom states they almost lost him but not sure if he was given to much medication  . Varicose veins    mutilple bleeding episodes, bilateral lower extremities)   Past Surgical History:  Past Surgical History:  Procedure Laterality Date  . CRANIOTOMY Left 12/02/2016   Procedure: CRANIOTOMY FOR CLIPPING OF INTRACRANIAL ANEURYSM;  Surgeon: Consuella Lose, MD;  Location: [place];  Service: Neurosurgery;  Laterality: Left;  . IR FLUORO RM 30-60 MIN  12/02/2016  . IR IVC FILTER PLMT / S&I /IMG GUID/MOD SED  12/11/2016  . IR US GUIDE VASC ACCESS RIGHT  12/02/2016  . lipo around heart  at age 20   pt states its bc they could hear his  heart beat  . ORIF ANKLE FRACTURE Left 09/02/2012   Procedure: OPEN REDUCTION INTERNAL FIXATION (ORIF) ANKLE FRACTURE;  Surgeon: Garald Balding, MD;  Location: [place];  Service: Orthopedics;  Laterality: Left;  OPEN REDUCTION INTERNAL FIXATION LEFT DISTAL FIBULA FRACTURE WITH REDUCTION FIXATION DISTAL TIBIA/FIBULAR DIASTASIS   . RADIOLOGY WITH  ANESTHESIA N/A 12/02/2016   Procedure: RADIOLOGY WITH ANESTHESIA;  Surgeon: Consuella Lose, MD;  Location: [place];  Service: Radiology;  Laterality: N/A;  . TRACHEOSTOMY TUBE PLACEMENT N/A 12/15/2016   Procedure: TRACHEOSTOMY;  Surgeon: Izora Gala, MD;  Location: [place];  Service: ENT;  Laterality: N/A;    Assessment & Plan Clinical Impression: Bryan D Mooreis a 36 y.o.right handed malewith morbid obesity on no prescription medications. Per chart review patient lives with parent independent prior to admission. Presented 12/01/2016 with sudden onset of headache. No recent fall or trauma reported. No history of seizures. CT angiogram of the head showed diffuse subarachnoid hemorrhage over both convex cities and extending into the basal cisterns, there was an inferiorly and laterally directed aneurysm arising from the communicating segment of the left ICA.Echocardiogram with ejection fraction of 29% area systolic function was vigorous.Patient developed worsening hypoxemia increasing shortness of breath requiring intubation. 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 Kathyrn Sheriff. Hospital course requiring tracheostomy 12/15/2016 per Dr. Constance Holster and speech therapy follow-up with [facility]. Findings of acute DVT left popliteal vein as well as left posterior tibial veins and underwent placement of IVC filter per interventional radiology05/17/2018. Tube feeds for nutritional supportwith diet advanced to dysphagia #3 honey thick liquids.MRSA PCR screen positive placed on contact precautions. Urine study with many bacteria, nitrite negative placed on Bactrim empirically.Physical and occupational therapy evaluations completed 12/16/2016 with recommendations of physical medicine rehabilitation consult.Patient was admitted for a comprehensive rehabilitation program.  Patient currently requires min- max with basic self-care skills secondary to muscle weakness,  decreased cardiorespiratoy endurance, decreased coordination and decreased attention, decreased awareness, decreased problem solving and decreased safety awareness.  Prior to hospitalization, patient could complete BADLs with modified independent .  Patient will benefit from skilled intervention to decrease level of assist with basic self-care skills and increase independence with basic self-care skills prior to discharge home with care partner.  Anticipate patient will require intermittent supervision and follow up home health.  OT - End of Session Endurance Deficit: Yes OT Assessment Barriers to Discharge: Decreased caregiver support OT Patient demonstrates impairments in the following area(s): Balance;Cognition;Endurance;Safety;Skin Integrity;Nutrition OT Basic ADL's Functional Problem(s): Grooming;Bathing;Dressing;Toileting OT Advanced ADL's Functional Problem(s): Simple Meal Preparation OT Transfers Functional Problem(s): Toilet;Tub/Shower OT Plan OT Intensity: Minimum of 1-2 x/day, 45 to 90 minutes OT Frequency: 5 out of 7 days OT Duration/Estimated Length of Stay: 10-14 OT Treatment/Interventions: Balance/vestibular training;Cognitive remediation/compensation;Discharge planning;Disease mangement/prevention;DME/adaptive equipment instruction;Functional mobility training;Pain management;Patient/family education;Psychosocial support;Self Care/advanced ADL retraining;Therapeutic Activities;UE/LE Coordination activities;Therapeutic Exercise;Visual/perceptual remediation/compensation;UE/LE Strength taining/ROM OT Self Feeding Anticipated Outcome(s): MOD I OT Basic Self-Care Anticipated Outcome(s): MOD I OT Toileting Anticipated Outcome(s): MOD I OT Bathroom Transfers Anticipated Outcome(s): MOD I OT Recommendation Patient destination: Home Follow Up Recommendations: Home health OT (TBD) Equipment Recommended: To be determined   Skilled Therapeutic Intervention 1:1. Pt educated on  role/purpose of OT, CIR, ELOS, and POC. +2 available for all transfers and pt maintained on RA for session with dips of O2 sats into low 80s, however recovering quickly with pursed lip breathing. Pt supine HOB elevated>sitting EOB with A for trunk facilitation. Pt stand pivot transfer with MOD A +2 for lifting EOB>w/c with VC for safety awareness and RW management. Pt completes bathing at sink at sit to stand level with A for washing buttocks and B lower legs. Pt sit to stand at sink for ~1-2 min with MIN A and VC to push from w/c instead of pull from walker. Pt stand pivot transfer w/c>bariatric BSC with MOD A for lifting. Pt ambulates from bariatric BSC ~5 feet to EOB with MIN A and VC to breath. EOB>supine with supervision. Throughout session pt easily distracted by TV demoing difficulty with selective attention. Exited session with pt semi reclined in bed and RN entering to change PICC dressing.   OT Evaluation Precautions/Restrictions  Precautions Precaution Comments: trach, monitor HR and O2 sats General Chart Reviewed: Yes Vital Signs Therapy Vitals Temp: 98.8 F (37.1 C) Temp Source: Axillary Pulse Rate: 85 Resp: 18 BP: (!) 100/58 Patient Position (if appropriate): Lying Oxygen Therapy SpO2: 99 % O2 Device: Tracheostomy Collar O2 Flow Rate (L/min): 6 L/min Pain Pain Assessment Pain Assessment: No/denies pain Home Living/Prior Functioning Home Living Available Help at Discharge: Family Type of Home: House Home Access: Stairs to enter Home Layout: One level Bathroom Shower/Tub: Chiropodist: Standard  Lives With: Alone (pt reporting he lives alone) IADL History Homemaking Responsibilities: Yes Meal Prep Responsibility: Secondary Laundry Responsibility: Secondary Cleaning Responsibility: Secondary Bill Paying/Finance Responsibility: Secondary Shopping Responsibility: Secondary Child Care Responsibility: No Current License: No Mode of Transportation:  Car, Family Occupation: Full time employment Type of Occupation: [employer] Leisure and Hobbies: hunting and farming Prior Function Level of Independence: Independent with basic ADLs  Able to Take Stairs?: Yes Driving: Yes Vocation: Full time employment Comments: Reports working PTA but difficult to get history due to trach. ADL   Vision Baseline Vision/History: No visual deficits Vision Assessment?: Yes Ocular Range of Motion:  Within Functional Limits Tracking/Visual Pursuits: Able to track stimulus in all quads without difficulty Perception  Perception: Within Functional Limits Praxis Praxis: Intact Cognition Overall Cognitive Status: Impaired/Different from baseline Arousal/Alertness: Awake/alert Orientation Level: Place;Person;Situation Person: Oriented Place: Oriented Situation: Oriented Year: 2018 Month: June Day of Week: Incorrect Memory: Impaired Memory Impairment: Decreased recall of new information Immediate Memory Recall: Sock;Blue;Bed Memory Recall: Blue;Bed Memory Recall Blue: Without Cue Memory Recall Bed: Without Cue Attention: Sustained Sustained Attention: Appears intact Awareness: Impaired Awareness Impairment: Emergent impairment Sensation Sensation Light Touch: Appears Intact Coordination Gross Motor Movements are Fluid and Coordinated: No Fine Motor Movements are Fluid and Coordinated: Yes Motor  Motor Motor: Within Functional Limits Mobility  Transfers Transfers: Sit to Stand Sit to Stand: 3: Mod assist  Trunk/Postural Assessment  Cervical Assessment Cervical Assessment: Within Functional Limits Thoracic Assessment Thoracic Assessment: Exceptions to [facility] (rounded shoulders) Lumbar Assessment Lumbar Assessment: Exceptions to [facility] (posterior pelvic tilt) Postural Control Postural Control: Within Functional Limits  Balance Balance Balance Assessed: Yes Dynamic Sitting Balance Dynamic Sitting - Balance Support: Feet  supported Dynamic Sitting - Level of Assistance: 5: Stand by assistance Sitting balance - Comments: leaning to wash BLE Static Standing Balance Static Standing - Balance Support: Bilateral upper extremity supported Static Standing - Level of Assistance: 4: Min assist Static Standing - Comment/# of Minutes: 2 min while OT washes buttocks Extremity/Trunk Assessment RUE Assessment RUE Assessment: Within Functional Limits LUE Assessment LUE Assessment: Exceptions to [facility] (decreased shoulder AROM mod ranges/strength 3-/5)   See Function Navigator for Current Functional Status.   Refer to Care Plan for Long Term Goals  Recommendations for other services: None    Discharge Criteria: Patient will be discharged from OT if patient refuses treatment 3 consecutive times without medical reason, if treatment goals not met, if there is a change in medical status, if patient makes no progress towards goals or if patient is discharged from hospital.  The above assessment, treatment plan, treatment alternatives and goals were discussed and mutually agreed upon: by patient  Tonny Branch 12/27/2016, 9:24 AM

## 2016-12-28 ENCOUNTER — Inpatient Hospital Stay (HOSPITAL_COMMUNITY): Payer: BLUE CROSS/BLUE SHIELD

## 2016-12-28 NOTE — Progress Notes (Signed)
Occupational Therapy Session Note  Patient Details  Name: Bryan [name] MRN: [id number] Date of Birth: 29-Sep-1980  Today's Date: 12/28/2016 OT Individual Time: 1100-1155 OT Individual Time Calculation (min): 55 min    Short Term Goals: Week 1:  OT Short Term Goal 1 (Week 1): Pt will sit to stand with supervision in prep for clohting managment OT Short Term Goal 2 (Week 1): Pt will don pants with MIN A. OT Short Term Goal 3 (Week 1): Pt will don footwear with MIN A and AE prn OT Short Term Goal 4 (Week 1): Pt will toilet with MAX A OT Short Term Goal 5 (Week 1): Pt will transfer to [facility] with touching A  Skilled Therapeutic Interventions/Progress Updates:    1:1. No pain reported. Pt O2 on RA stays >95% throughout activity. Pt agreeable to shower LB with LHSS seated on BSC with trach covered with trach shield and PICC covered. OT instructs pt on use of LHSS to wash BLE. While seated on BSC pt has incontinent bowel and bladder movement on toilet with increased time for OT to complete posterior hygiene while pt stands with supervision and VC to not lean over onto bariatric walker. Pt declines UB bathing at this time. With cues for technique to don socks with sock aide pt dons R sock, however does not like sock aide and requires A to put on L sock. Pt stands at sink with supervision to brush teeth with VC for posture. Exited session with pt seated in w/c with call light in reach and all needs met  Therapy Documentation Precautions:  Precautions Precaution Comments: trach, monitor HR and O2 sats Restrictions Weight Bearing Restrictions: No  See Function Navigator for Current Functional Status.   Therapy/Group: Individual Therapy  Tonny Branch 12/28/2016, 12:10 PM

## 2016-12-28 NOTE — Progress Notes (Signed)
[place] PHYSICAL MEDICINE & REHABILITATION     PROGRESS NOTE    Subjective/Complaints: Slow to arouse this morning. After I left room, patient apparently picked at incision, opening it up  ROS: Limited due to cognitive/behavioral   Objective: Vital Signs: Blood pressure (!) 110/58, pulse 90, temperature 99.2 F (37.3 C), temperature source Oral, resp. rate 18, SpO2 96 %. No results found. No results for input(s): WBC, HGB, HCT, PLT in the last 72 hours. No results for input(s): NA, K, CL, GLUCOSE, BUN, CREATININE, CALCIUM in the last 72 hours.  Invalid input(s): CO CBG (last 3)   Recent Labs  12/26/16 0311 12/26/16 0807 12/26/16 1240  GLUCAP 102* 86 144*    Wt Readings from Last 3 Encounters:  12/25/16 (!) 189.1 kg (417 lb)  02/26/15 (!) 198.7 kg (438 lb)  02/19/15 (!) 198.7 kg (438 lb)    Physical Exam:  Constitutional:  36 y/o right handed obese male. HENT:  Head: Normocephalic.  Sclerae improving Eyes: EOMare normal.  Left crani scar dry and intact Neck:  Trach in place, #8 XL? Some tannish secretions around trach Cardiovascular: RRR Respiratory: no respiratory distress.  GI: Soft. Bowel sounds are normal. He exhibits no distension.  Musculoskeletal: He exhibits no edema.  Skin.bleeding, opening of scalp incision Neurological:  Somnolent, does follow simple commands.  Moves all 4 limbs---strength 3-4/5 UE's and 2 HF, 3-KE and 3+ ADF/PF. sensespain in all 4.  Psych :flat.   Assessment/Plan: 1. Functional deficits secondary to left p-com aneurysm which require 3+ hours per day of interdisciplinary therapy in a comprehensive inpatient rehab setting. Physiatrist is providing close team supervision and 24 hour management of active medical problems listed below. Physiatrist and rehab team continue to assess barriers to discharge/monitor patient progress toward functional and medical goals.  Function:  Bathing Bathing position   Position:  Wheelchair/chair at sink  Bathing parts Body parts bathed by patient: Right arm, Left arm, Chest, Abdomen, Front perineal area, Right upper leg, Left upper leg Body parts bathed by helper: Buttocks, Right lower leg, Left lower leg, Back  Bathing assist Assist Level:  (MOD A)      Upper Body Dressing/Undressing Upper body dressing   What is the patient wearing?: Hospital gown                Upper body assist        Lower Body Dressing/Undressing Lower body dressing   What is the patient wearing?: Hospital Gown, Socks               Socks - Performed by helper: Don/doff right sock, Don/doff left sock              Lower body assist        Toileting Toileting          Toileting assist     Transfers Chair/bed transfer   Chair/bed transfer method: Stand pivot Chair/bed transfer assist level: Supervision or verbal cues Chair/bed transfer assistive device: Patent attorney     Max distance: 58ft Assist level: Supervision or Careers adviser activity did not occur: Refused        Cognition Comprehension Comprehension assist level: Follows basic conversation/direction with no assist  Expression Expression assist level: Expresses basic 50 - 74% of the time/requires cueing 25 - 49% of the time. Needs to repeat parts of sentences.  Social Interaction Social Interaction assist level: Interacts appropriately 90% of the time -  Needs monitoring or encouragement for participation or interaction.  Problem Solving Problem solving assist level: Solves basic 50 - 74% of the time/requires cueing 25 - 49% of the time  Memory Memory assist level: Recognizes or recalls 50 - 74% of the time/requires cueing 25 - 49% of the time  Medical Problem List and Plan: 1. 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 DVT  Prophylaxis/Anticoagulation: Left popliteal vein as well as left posterior tibial vein DVT. Status post IVC filter 12/11/2016 per interventional radiology 3. Pain Management: Tylenol as needed. Denies any pain or headache at present 4. Mood: Provide emotional support 5. Neuropsych: This patient is intermittentlycapable of making decisions on hisown behalf.  -consider stimulant for more consistent arousal 6. Skin/Wound Care: Routine skin checks  -local care to scalp incision 7. 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 Speech therapy follow-up PMV. Presently with a #8 XLTin place -phonating well. Consider trach downsize on Monday if more consistently alert 9.Dysphagia. Dysphagia #3 honey thick liquids. Follow speech therapy. Monitor hydration while on honeys 10.Hypertension. Monitor with increased mobility 11.Morbid obesity. BMI 56.35 kg Dietary follow-up 12.MRSA PCR screen positive. Contact precautions 13.UTI.Empiric Bactrim, UCX multispecies--recollect specimen  LOS (Days) 2 A FACE TO FACE EVALUATION WAS PERFORMED  Ranelle OysterSWARTZ,Florance Paolillo T, MD 12/28/2016 9:22 AM

## 2016-12-29 ENCOUNTER — Inpatient Hospital Stay (HOSPITAL_COMMUNITY): Payer: BLUE CROSS/BLUE SHIELD | Admitting: Speech Pathology

## 2016-12-29 ENCOUNTER — Inpatient Hospital Stay (HOSPITAL_COMMUNITY): Payer: BLUE CROSS/BLUE SHIELD | Admitting: Physical Therapy

## 2016-12-29 ENCOUNTER — Inpatient Hospital Stay (HOSPITAL_COMMUNITY): Payer: BLUE CROSS/BLUE SHIELD | Admitting: Occupational Therapy

## 2016-12-29 LAB — CULTURE, BLOOD (ROUTINE X 2)
CULTURE: NO GROWTH
Culture: NO GROWTH

## 2016-12-29 LAB — COMPREHENSIVE METABOLIC PANEL
ALK PHOS: 99 U/L (ref 38–126)
ALT: 51 U/L (ref 17–63)
AST: 26 U/L (ref 15–41)
Albumin: 2.4 g/dL — ABNORMAL LOW (ref 3.5–5.0)
Anion gap: 5 (ref 5–15)
BILIRUBIN TOTAL: 0.2 mg/dL — AB (ref 0.3–1.2)
BUN: 16 mg/dL (ref 6–20)
CALCIUM: 9 mg/dL (ref 8.9–10.3)
CO2: 26 mmol/L (ref 22–32)
CREATININE: 1.08 mg/dL (ref 0.61–1.24)
Chloride: 106 mmol/L (ref 101–111)
GFR calc Af Amer: >60 mL/min (ref >60)
GFR calc non Af Amer: >60 mL/min (ref >60)
GLUCOSE: 107 mg/dL — AB (ref 65–99)
Potassium: 3.6 mmol/L (ref 3.5–5.1)
Sodium: 137 mmol/L (ref 135–145)
TOTAL PROTEIN: 7.4 g/dL (ref 6.5–8.1)

## 2016-12-29 LAB — CBC WITH DIFFERENTIAL/PLATELET
BASOS ABS: 0 10*3/uL (ref 0.0–0.1)
BASOS PCT: 0 %
EOS ABS: 0.3 10*3/uL (ref 0.0–0.7)
Eosinophils Relative: 4 %
HEMATOCRIT: 30.2 % — AB (ref 39.0–52.0)
HEMOGLOBIN: 9.3 g/dL — AB (ref 13.0–17.0)
Lymphocytes Relative: 24 %
Lymphs Abs: 1.7 10*3/uL (ref 0.7–4.0)
MCH: 27 pg (ref 26.0–34.0)
MCHC: 30.8 g/dL (ref 30.0–36.0)
MCV: 87.5 fL (ref 78.0–100.0)
Monocytes Absolute: 0.8 10*3/uL (ref 0.1–1.0)
Monocytes Relative: 12 %
NEUTROS PCT: 60 %
Neutro Abs: 4.2 10*3/uL (ref 1.7–7.7)
Platelets: 333 10*3/uL (ref 150–400)
RBC: 3.45 MIL/uL — ABNORMAL LOW (ref 4.22–5.81)
RDW: 14.5 % (ref 11.5–15.5)
WBC: 7 10*3/uL (ref 4.0–10.5)

## 2016-12-29 LAB — URINE CULTURE

## 2016-12-29 MED ORDER — STARCH (THICKENING) PO POWD
ORAL | Status: DC | PRN
  Filled 2016-12-29: qty 227

## 2016-12-29 MED ORDER — RESOURCE THICKENUP CLEAR PO POWD
ORAL | Status: DC | PRN
  Filled 2016-12-29 (×2): qty 125

## 2016-12-29 NOTE — Progress Notes (Signed)
PHYSICAL MEDICINE & REHABILITATION     PROGRESS NOTE    Subjective/Complaints: More alert. Awake and kidding around. A little disappointed that wound is open on head but realizes he accidentally scratched it open yesterday  ROS: pt denies nausea, vomiting, diarrhea, cough, shortness of breath or chest pain   Objective: Vital Signs: Blood pressure 123/84, pulse 84, temperature 99.2 F (37.3 C), temperature source Oral, resp. rate 16, SpO2 97 %. No results found.  Recent Labs  12/29/16 0429  WBC 7.0  HGB 9.3*  HCT 30.2*  PLT 333    Recent Labs  12/29/16 0429  NA 137  K 3.6  CL 106  GLUCOSE 107*  BUN 16  CREATININE 1.08  CALCIUM 9.0   CBG (last 3)   Recent Labs  12/26/16 1240  GLUCAP 144*    Wt Readings from Last 3 Encounters:  12/25/16 (!) 189.1 kg (417 lb)  02/26/15 (!) 198.7 kg (438 lb)  02/19/15 (!) 198.7 kg (438 lb)    Physical Exam:  Constitutional:  36 y/o right handed obese male. HENT:  Head: Normocephalic.  Sclerae improving Eyes: EOMare normal.  Left crani scar dry and intact Neck:  Trach in place, #8 XLT--PMV. No secretions. Good phonation Cardiovascular: RRR Respiratory: lungs clear. Normal effort  GI: Soft. Bowel sounds are normal. He exhibits no distension.  Musculoskeletal: He exhibits no edema.  Skin.bleeding, opening of scalp incision Neurological:  Awake and quickly follows simple commands.  Moves all 4 limbs---strength 3-4/5 UE's and 2 HF, 3-KE and 3+ ADF/PF. sensespain in all 4.  Psych :more dynamic today, joking around.   Assessment/Plan: 1. Functional deficits secondary to left p-com aneurysm which require 3+ hours per day of interdisciplinary therapy in a comprehensive inpatient rehab setting. Physiatrist is providing close team supervision and 24 hour management of active medical problems listed below. Physiatrist and rehab team continue to assess barriers to discharge/monitor patient progress toward  functional and medical goals.  Function:  Bathing Bathing position   Position: Shower  Bathing parts Body parts bathed by patient: Right upper leg, Left upper leg, Left lower leg Body parts bathed by helper: Buttocks, Back (LB bathing only )  Bathing assist Assist Level: Touching or steadying assistance(Pt > 75%) (LB bathing only)      Upper Body Dressing/Undressing Upper body dressing   What is the patient wearing?: Hospital gown                Upper body assist        Lower Body Dressing/Undressing Lower body dressing   What is the patient wearing?: Hospital Gown, Socks             Socks - Performed by patient: Don/doff right sock Socks - Performed by helper: Don/doff left sock              Lower body assist        Toileting Toileting     Toileting steps completed by helper: Performs perineal hygiene (in shower on BSC)    Toileting assist Assist level:  (TOTAL A)   Transfers Chair/bed transfer   Chair/bed transfer method: Stand pivot Chair/bed transfer assist level: Supervision or verbal cues Chair/bed transfer assistive device: Patent attorney     Max distance: 82ft Assist level: Supervision or Careers adviser activity did not occur: Refused        Cognition Comprehension Comprehension assist level: Understands complex 90% of the time/cues  10% of the time  Expression Expression assist level: Expresses basic 50 - 74% of the time/requires cueing 25 - 49% of the time. Needs to repeat parts of sentences.  Social Interaction Social Interaction assist level: Interacts appropriately 90% of the time - Needs monitoring or encouragement for participation or interaction.  Problem Solving Problem solving assist level: Solves basic 50 - 74% of the time/requires cueing 25 - 49% of the time  Memory Memory assist level: Recognizes or recalls 50 - 74% of the time/requires cueing 25 - 49% of the time  Medical Problem List  and Plan: 1. 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 DVT Prophylaxis/Anticoagulation: Left popliteal vein as well as left posterior tibial vein DVT. Status post IVC filter 12/11/2016 per interventional radiology 3. Pain Management: Tylenol as needed. Denies any pain or headache at present 4. Mood: Provide emotional support 5. Neuropsych: This patient is intermittentlycapable of making decisions on hisown behalf.  -pt is more alert this AM 6. Skin/Wound Care: Routine skin checks  -local care to scalp incision 7. Fluids/Electrolytes/Nutrition:  -encourage PO, intake fair  -continue on HS IVF for now  -I personally reviewed the patient's labs today.   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 Speech therapy follow-up PMV. Presently with a #8 XLTin place -phonating well.   -downsize to #6 XLT shiley today 9.Dysphagia. Dysphagia #3 honey thick liquids. Follow speech therapy. Monitor hydration while on honeys 10.Hypertension. Monitor with increased mobility 11.Morbid obesity. BMI 56.35 kg Dietary follow-up 12.MRSA PCR screen positive. Contact precautions 13.UTI.Empiric Bactrim, UCX multispecies--re-culture pending     LOS (Days) 3 A FACE TO FACE EVALUATION WAS PERFORMED  Faith RogueSWARTZ,Carrye Goller T, MD 12/29/2016 9:13 AM

## 2016-12-29 NOTE — Care Management Note (Signed)
[facility] Individual Statement of Services  Patient Name:  Bryan [name]  Date:  12/29/2016  Welcome to the [facility]patient [facility].  Our goal is to provide you with an individualized program based on your diagnosis and situation, designed to meet your specific needs.  With this comprehensive rehabilitation program, you will be expected to participate in at least 3 hours of rehabilitation therapies Monday-Friday, with modified therapy programming on the weekends.  Your rehabilitation program will include the following services:  Physical Therapy (PT), Occupational Therapy (OT), Speech Therapy (ST), 24 hour per day rehabilitation nursing, Therapeutic Recreaction (TR), Neuropsychology, Case Management (Social Worker), Rehabilitation Medicine, Nutrition Services and Pharmacy Services  Weekly team conferences will be held on Tuesdays to discuss your progress.  Your Social Worker will talk with you frequently to get your input and to update you on team discussions.  Team conferences with you and your family in attendance may also be held.  Expected length of stay: 10-14 days  Overall anticipated outcome: supervision  Depending on your progress and recovery, your program may change. Your Social Worker will coordinate services and will keep you informed of any changes. Your Social Worker's name and contact numbers are listed  below.  The following services may also be recommended but are not provided by the Inpatient Rehabilitation Center:   Driving Evaluations  Home Health Rehabiltiation Services  Outpatient Rehabilitation Services  Vocational Rehabilitation   Arrangements will be made to provide these services after discharge if needed.  Arrangements include referral to agencies that provide these services.  Your insurance has been verified to be:  [employer] [place] Your primary doctor is:  Therapist, [facility]   Pertinent information will be shared with your doctor and your  insurance company.  Social Worker:  Bryan [name], Bryan [name] or ([phone]) [name]   Information discussed with and copy given to patient by: Bryan [name], Bryan [name], 12/29/2016, 11:53 AM

## 2016-12-29 NOTE — Procedures (Signed)
Tracheostomy Change Note  Patient Details:   Name: Bryan [name] DOB: Feb 08, 1981 MRN: [id number]    Airway Documentation:     Evaluation  O2 sats: stable throughout Complications: No apparent complications Patient did tolerate procedure well. Bilateral Breath Sounds: Clear, Diminished    Trach changed at this time by self & Andy Gaussarla Hester RRT per MD order.  #6XLT cfls placed without complication. +ETCo2 color change, good air movement, strong cough, spo2 100%, RT will monitor   Cherylin MylarDoyle, Matalie Romberger 12/29/2016, 5:07 PM

## 2016-12-29 NOTE — Progress Notes (Signed)
Speech Language Pathology Daily Session Note  Patient Details  Name: Bryan [name] MRN: [id number] Date of Birth: [birth year]  Today's Date: 12/29/2016 SLP Individual Time: 0915-1000 SLP Individual Time Calculation (min): 45 min  Short Term Goals: Week 1: SLP Short Term Goal 1 (Week 1): Pt will consume dys 3 textures and honey thick liquids with supervision cues for use of swallowing precautions.  SLP Short Term Goal 2 (Week 1): Pt will consume therapeutic trials of ice chips to continue working towards repeat objective assessment with supervision cues for use of swallowing precautions.  SLP Short Term Goal 3 (Week 1): Pt will return demonstration of safe speaking valve use with min question cues.   SLP Short Term Goal 4 (Week 1): Pt will increase vocal intensity to achieve intelligibility in sentences with min assist.   SLP Short Term Goal 5 (Week 1): Pt will Pt will increase functional cognitive skills for short term recall of info at the word/sentence/paragraph level after 5 minutes with 90% acc given min cues.  SLP Short Term Goal 6 (Week 1): Pt will increase funtional cognive abstract reasoning/mental flexibility skills for providing 3 appropriate answers to for vairous safety awareness scenarios with 90% acc with min assist.   Skilled Therapeutic Interventions: Skilled treatment session focused on cognitive goals. Upon arrival, PMSV was in place with all vitals remaining WFL. Patient was Mod I for problem solving and required supervision verbal cues for recall during a basic money management task. Patient also participated in a basic conversation with extra time needed for verbal responses and processing. Patient left upright in wheelchair with all needs within reach. Continue with current plan of care.   Function:  Cognition Comprehension Comprehension assist level: Understands complex 90% of the time/cues 10% of the time  Expression   Expression assist level: Expresses basic 50 - 74% of  the time/requires cueing 25 - 49% of the time. Needs to repeat parts of sentences.  Social Interaction Social Interaction assist level: Interacts appropriately 90% of the time - Needs monitoring or encouragement for participation or interaction.  Problem Solving Problem solving assist level: Solves basic 90% of the time/requires cueing < 10% of the time  Memory Memory assist level: Recognizes or recalls 75 - 89% of the time/requires cueing 10 - 24% of the time    Pain Pain Assessment Pain Assessment: No/denies pain  Therapy/Group: Individual Therapy  Bryan [name] 12/29/2016, 4:03 PM

## 2016-12-29 NOTE — IPOC Note (Addendum)
Overall Plan of Care [facility]) Patient Details Name: Bryan [name] MRN: [id number] DOB: 1980-09-27  Admitting Diagnosis: L Aneurysm rupture respiratory  Hospital Problems: Principal Problem:   Subarachnoid hemorrhage from aneurysm of left posterior communicating artery (HCC) Active Problems:   Essential hypertension   Tracheostomy status (HCC)   Oropharyngeal dysphagia     Functional Problem List: Nursing Edema, Safety, Endurance, Medication Management  PT Balance, Endurance, Motor, Perception, Safety, Sensory, Skin Integrity, Nutrition, Edema, Pain  OT Balance, Cognition, Endurance, Safety, Skin Integrity, Nutrition  SLP Nutrition  TR         Basic ADL's: OT Grooming, Bathing, Dressing, Toileting     Advanced  ADL's: OT Simple Meal Preparation     Transfers: PT Bed Mobility, Bed to Chair, Car, Furniture, Floor  OT Toilet, Research scientist (life sciences): PT Ambulation, Psychologist, prison and probation services, Stairs     Additional Impairments: OT    SLP Communication expression    TR      Anticipated Outcomes Item Anticipated Outcome  Self Feeding MOD I  Swallowing  Supervision    Basic self-care  MOD I  Toileting  MOD I   Bathroom Transfers MOD I  Bowel/Bladder  n/a  Transfers  Mod I with LRAD  Locomotion  Mod I with LRAD for household distances  Communication  Supervision  Cognition     Pain  n/a  Safety/Judgment  maintain safety with min assist   Therapy Plan: PT Intensity: Minimum of 1-2 x/day ,45 to 90 minutes PT Frequency: 5 out of 7 days PT Duration Estimated Length of Stay: 12-14days  OT Intensity: Minimum of 1-2 x/day, 45 to 90 minutes OT Frequency: 5 out of 7 days OT Duration/Estimated Length of Stay: 10-14 SLP Intensity: Minumum of 1-2 x/day, 30 to 90 minutes SLP Frequency: 3 to 5 out of 7 days SLP Duration/Estimated Length of Stay: 14-21 days        Team Interventions: Nursing Interventions Patient/Family Education, Disease Management/Prevention,  Skin Care/Wound Management, Discharge Planning, Medication Management, Dysphagia/Aspiration Precaution Training  PT interventions Balance/vestibular training, Ambulation/gait training, Cognitive remediation/compensation, Community reintegration, Discharge planning, Disease management/prevention, DME/adaptive equipment instruction, Functional mobility training, Neuromuscular re-education, Pain management, Patient/family education, Psychosocial support, Skin care/wound management, Stair training, Therapeutic Activities, Therapeutic Exercise, UE/LE Strength taining/ROM, UE/LE Coordination activities, Wheelchair propulsion/positioning, Visual/perceptual remediation/compensation  OT Interventions Warden/ranger, Cognitive remediation/compensation, Discharge planning, Disease mangement/prevention, DME/adaptive equipment instruction, Functional mobility training, Pain management, Patient/family education, Psychosocial support, Self Care/advanced ADL retraining, Therapeutic Activities, UE/LE Coordination activities, Therapeutic Exercise, Visual/perceptual remediation/compensation, UE/LE Strength taining/ROM  SLP Interventions Cueing hierarchy, Dysphagia/aspiration precaution training, Patient/family education, Internal/external aids  TR Interventions    SW/CM Interventions Discharge Planning, Psychosocial Support, Patient/Family Education    Team Discharge Planning: Destination: PT-Home ,OT- Home , SLP-Home Projected Follow-up: PT-Home health PT, OT-  Home health OT (TBD), SLP-Home Health SLP, Outpatient SLP, 24 hour supervision/assistance Projected Equipment Needs: PT-Rolling walker with 5" wheels, OT- To be determined, SLP-To be determined Equipment Details: PT- , OT-  Patient/family involved in discharge planning: PT- Patient,  OT-Patient, SLP-Patient  MD ELOS: 12-15 days Medical Rehab Prognosis:  Excellent Assessment: The patient has been admitted for CIR therapies with the diagnosis of  left SAH. The team will be addressing functional mobility, strength, stamina, balance, safety, adaptive techniques and equipment, self-care, bowel and bladder mgt, patient and caregiver education, NMR, communication, swallowing, cognition, trach mgt, wound care. Goals have been set at mod I for basic mobility and self-care and mod I to supervision with  communication and cognition.    Bryan OysterZachary T. Swartz, MD, FAAPMR      See Team Conference Notes for weekly updates to the plan of care

## 2016-12-29 NOTE — Progress Notes (Signed)
Physical Therapy Session Note  Patient Details  Name: Bryan [name] MRN: [id number] Date of Birth: 1981-06-17  Today's Date: 12/29/2016 PT Individual Time: [id number] and 1447-1530 PT Individual Time Calculation (min): 45 min and 43 min  Skilled Therapeutic Interventions/Progress Updates:  Treatment 1: Pt received in room & agreeable to tx, denying c/o pain. Session focused on activity tolerance and gait. Transported pt to gym via w/c total assist for time management. Pt completes sit<>stand transfers with supervision with occasional cuing for hand placement on w/c. Pt ambulated 100 ft with RW & steady assist with increased gait speed. Pt engaged in standing ball toss with steady assist for balance, tolerating standing 3 minutes max over the course of 2 trials. Pt on 3L/min 28% FiO2 during session, vitals: SpO2 = 100%, HR = 89 bpm. At end of session pt left sitting in w/c in room with all needs within reach.   Treatment 2: Pt received in room & agreeable to tx. Pt denied c/o pain. Session focused on endurance, BLE strengthening, gait training without AD. Pt donned shoes with set up assist and more than reasonable amount of time. Pt propelled w/c room towards gym and back with BLE with task focusing on BLE strengthening. Pt unable to propel all the way room<>gym 2/2 fatigue, pt also requires multiple rest breaks 2/2 fatigue. In gym pt ambulated 6 ft without AD and steady assist; after task pt reports he is not ready to ambulate without AD. Pt reports feeling woozy, BP = 115/84 mmHg, SpO2 >90% on room air. Pt took extended rest break & reported "woozy" feeling subsided. Pt noted significant fatigue as well. At end of session pt left sitting in w/c in room, all needs within reach, and on room air (SpO2 >93%).  Therapy Documentation Precautions:  Precautions Precaution Comments: trach, monitor HR and O2 sats Restrictions Weight Bearing Restrictions: No   See Function Navigator for Current Functional  Status.   Therapy/Group: Individual Therapy  Sandi MariscalVictoria M Sakia Schrimpf 12/29/2016, 3:47 PM

## 2016-12-29 NOTE — Progress Notes (Signed)
Occupational Therapy Session Note  Patient Details  Name: Bryan [name] MRN: [id number] Date of Birth: 1981/06/07  Today's Date: 12/29/2016 OT Individual Time: [id number] OT Individual Time Calculation (min): 58 min    Short Term Goals: Week 1:  OT Short Term Goal 1 (Week 1): Pt will sit to stand with supervision in prep for clohting managment OT Short Term Goal 2 (Week 1): Pt will don pants with MIN A. OT Short Term Goal 3 (Week 1): Pt will don footwear with MIN A and AE prn OT Short Term Goal 4 (Week 1): Pt will toilet with MAX A OT Short Term Goal 5 (Week 1): [facility]  Skilled Therapeutic Interventions/Progress Updates:    Upon entering the room, pt supine in bed with RN present giving medication. Pt with no c/o pain this session. Pt performing supine >sit with supervision. Pt performed sit >stand with mod A for lifting assistance. Pt performed stand pivot transfer from bed >wheelchair with min A for safety. Pt seated in wheelchair at sink for grooming tasks at set up/supervision level. Pt engaged in B UE dressing from seated position with set up A. Pt standing at sink with supervision and therapist assisted pt in washing buttocks. Pt taking seated rest break. Pt seated with father present in the room and education provided regarding LOS, progress, and OT goals. Pt and father verbalized understanding. Pt remaining seated in wheelchair at end of session with call bell and all needed items within reach.   Therapy Documentation Precautions:  Precautions Precaution Comments: trach, monitor HR and O2 sats Restrictions Weight Bearing Restrictions: No Vital Signs: Therapy Vitals Pulse Rate: 91 Resp: 16 Patient Position (if appropriate): Sitting Oxygen Therapy SpO2: 96 % O2 Device: Tracheostomy Collar O2 Flow Rate (L/min): 6 L/min FiO2 (%): 28 %  See Function Navigator for Current Functional Status.   Therapy/Group: Individual Therapy  Alen BleacherBradsher, Karmela Bram  P 12/29/2016, 1:08 PM

## 2016-12-29 NOTE — Progress Notes (Signed)
Social Work  Social Work Assessment and Plan  Patient Details  Name: Bryan KaysDarren D Besancon MRN: [id number] Date of Birth: Jan 26, 1981  Today's Date: 12/29/2016  Problem List:  Patient Active Problem List   Diagnosis Date Noted  . Tracheostomy status (HCC)   . Oropharyngeal dysphagia   . 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 Pressure injury of skin 12/25/2016  . Encounter for orogastric (OG) tube placement   . Bad headache   . Syncope   . Endotracheally intubated   . Essential hypertension   . Respiratory failure with hypoxia (HCC)   . SAH (subarachnoid hemorrhage) (HCC) 12/01/2016   Past Medical History:  Past Medical History:  Diagnosis Date  . Complication of anesthesia    mom states they almost lost him but not sure if he was given to much medication  . Varicose veins    mutilple bleeding episodes, bilateral lower extremities)   Past Surgical History:  Past Surgical History:  Procedure Laterality Date  . CRANIOTOMY Left 12/02/2016   Procedure: CRANIOTOMY FOR CLIPPING OF INTRACRANIAL ANEURYSM;  Surgeon: Lisbeth RenshawNundkumar, Neelesh, MD;  Location: [facility] OR;  Service: Neurosurgery;  Laterality: Left;  . IR FLUORO RM 30-60 MIN  12/02/2016  . IR IVC FILTER PLMT / S&I /IMG GUID/MOD SED  12/11/2016  . IR US GUIDE VASC ACCESS RIGHT  12/02/2016  . lipo around heart  at age 36   pt states its bc they could hear his  heart beat  . ORIF ANKLE FRACTURE Left 09/02/2012   Procedure: OPEN REDUCTION INTERNAL FIXATION (ORIF) ANKLE FRACTURE;  Surgeon: Valeria BatmanPeter W Whitfield, MD;  Location: [facility] OR;  Service: Orthopedics;  Laterality: Left;  OPEN REDUCTION INTERNAL FIXATION LEFT DISTAL FIBULA FRACTURE WITH REDUCTION FIXATION DISTAL TIBIA/FIBULAR DIASTASIS   . RADIOLOGY WITH ANESTHESIA N/A 12/02/2016   Procedure: RADIOLOGY WITH ANESTHESIA;  Surgeon: Lisbeth RenshawNundkumar, Neelesh, MD;  [facility];  Service: Radiology;  Laterality: N/A;  . TRACHEOSTOMY TUBE PLACEMENT N/A  12/15/2016   Procedure: TRACHEOSTOMY;  Surgeon: Serena Colonelosen, Jefry, MD;  [facility];  Service: ENT;  Laterality: N/A;   Social History:  reports that he has never smoked. His smokeless tobacco use includes Chew. He reports that he drinks alcohol. He reports that he does not use drugs.  Family / Support Systems Marital Status: Single Patient Roles: Other (Comment) (son, sibling, employee) Other Supports: mother, Clearance CootsValinda Malloy [facility] @ (C) [phone];  grandparents, Marilu FavreClarence and Lu DuffelLatricia McCulloch. Anticipated Caregiver: Family Ability/Limitations of Caregiver: None Caregiver Availability: 24/7 Family Dynamics: Pt describes mother and grandparents as very supportive.  Mother confirms that they are prepared to provide 24/7 assistance.  Social History Preferred language: English Religion: Baptist Cultural Background: NA Education: HS Read: Yes Write: Yes Employment Status: Employed Name of Employer: [employer] Length of Employment: 10 (yrs) Return to Work Plans: Pt hopeful to be able to return but does have STD from work. Legal Hisotry/Current Legal Issues: None Guardian/Conservator: None - per MD, pt still not fully capable of making decisions on his own behalf - defer to mother.   Abuse/Neglect Physical Abuse: Denies Verbal Abuse: Denies Sexual Abuse: Denies Exploitation of patient/patient's resources: Denies Self-Neglect: Denies  Emotional Status Pt's affect, behavior adn adjustment status: Pt pleasant, talkative and able to complete assessment interview without much difficulty.  He was communicating well with PMSV.  He denies any significant emotional distress, however, admits much frustration with functional limitations.  Will monitor while here and refer for neuropsychology as indicated. Recent Psychosocial  Issues: None Pyschiatric History: None Substance Abuse History: None  Patient / Family Perceptions, Expectations & Goals Pt/Family understanding of illness &  functional limitations: Pt able to provide some details of waking with a severe HA and "then I just fell out... woke up here..." He then adds "...they cut into my head" but could not name the reason for the surgery.  When suggested aneurysm/ crani for clipping and nod "yes" that he is aware that this happended.  Mother with good understanding of medical issues and of current functional limtations. Premorbid pt/family roles/activities: Pt was completely independent and working f/t. Anticipated changes in roles/activities/participation: Most goals being set for Mod ind.  Upon CIR, however, family was told to plan for 24/7 care needs and mother to share in this with grandparents. Pt/family expectations/goals: "I want to be able to do what I can for myself."  [employer]: None Premorbid Home Care/DME Agencies: None Transportation available at discharge: yes Resource referrals recommended: Neuropsychology, Support group (specify)  Discharge Planning Living Arrangements: Other relatives Support Systems: Parent, Other relatives Type of Residence: Private residence Insurance Resources: Media planner (specify) ([place] Financial Resources: Employment Financial Screen Referred: No Living Expenses: Own Money Management: Patient Does the patient have any problems obtaining your medications?: No Home Management: pt Patient/Family Preliminary Plans: Pt plans to d/c home with grandparents and parent providing any needed assist. Social Work Anticipated Follow Up Needs: HH/OP Expected length of stay: 10-14 days  Clinical Impression Unfortunate gentleman here following a ruptured aneurysm and s/p clipping. Very pleasant and able to complete interview assessment with little difficulty.  Answers confirmed with pt's mother.  He denies any significant emotional distress.  Mother adds that he is "very stubborn" and expects he will be eager to return to his PLOF ASAP - she is  realistic that this may generate some mood issues after d/c.  Will follow for support and d/c planning needs.  Athleen Feltner 12/29/2016, 3:46 PM

## 2016-12-29 NOTE — Progress Notes (Signed)
Occupational Therapy Session Note  Patient Details  Name: Bryan KaysDarren D Kelty MRN: [id number] Date of Birth: October 18, 1980  Today's Date: 12/29/2016 OT Individual Time: [id number] OT Individual Time Calculation (min): 78 min    Short Term Goals: Week 1:  OT Short Term Goal 1 (Week 1): Pt will sit to stand with supervision in prep for clohting managment OT Short Term Goal 2 (Week 1): Pt will don pants with MIN A. OT Short Term Goal 3 (Week 1): Pt will don footwear with MIN A and AE prn OT Short Term Goal 4 (Week 1): Pt will toilet with MAX A OT Short Term Goal 5 (Week 1): [facility]  Skilled Therapeutic Interventions/Progress Updates:    Pt in wheelchair to start session without trach collar on O2 Sats at 98% on room air.  Had pt complete sit to stand with min assist from the wheelchair with use of the bariatric walker.  He was able to maintain static standing for 3 mins with HR increasing to upper 90s and sats remaining at 98%.  Took pt to the therapy gym with work on dynamic standing balance and endurance.  Min assist to maintain balance while complete alternating stepping up to 4 inch block.  LOB posteriorly with initial attempts with out assistive device.  Provide hand held assist for 3-4 intervals of sets of 5-6 steps on each leg.  Pt with rest breaks needed after these repetitions.  Transitioned to stepping over small 4 inch barrier for simulation of walk-in shower (pt has tub shower but not ready for stepping over it).  Also had him work on picking cone up off of the floor and placing it back as well.  Min assist for balance with all these tasks.  Oxygen sats taken post activity with HR increasing to 122 with oxygen sats maintained at 97% or better.  Pt with one episode of bladder incontinence as well in the gym, stating he had to go to the bathroom but once he stood up to walk to the bathroom he urinated in the floor.  Therapist cleaned up urine and pt was transferred back  to the room via wheelchair.  Pt left with call button and phone in reach at end of session.    Therapy Documentation Precautions:  Precautions Precautions: Fall Precaution Comments: trach, monitor HR and O2 sats Restrictions Weight Bearing Restrictions: No  Vital Signs: Therapy Vitals Pulse Rate: 91 Resp: 16 BP: 115/84 Patient Position (if appropriate): Sitting Oxygen Therapy SpO2: 99 % O2 Device: Not Delivered (PMSV) FiO2 (%): 28 % Pain: Pain Assessment Pain Assessment: No/denies pain ADL:  See Function Navigator for Current Functional Status.   Therapy/Group: Individual Therapy  Sante Biedermann OTR/L 12/29/2016, 3:32 PM

## 2016-12-30 ENCOUNTER — Inpatient Hospital Stay (HOSPITAL_COMMUNITY): Payer: BLUE CROSS/BLUE SHIELD | Admitting: Speech Pathology

## 2016-12-30 ENCOUNTER — Inpatient Hospital Stay (HOSPITAL_COMMUNITY): Payer: BLUE CROSS/BLUE SHIELD | Admitting: Occupational Therapy

## 2016-12-30 ENCOUNTER — Inpatient Hospital Stay (HOSPITAL_COMMUNITY): Payer: BLUE CROSS/BLUE SHIELD | Admitting: Physical Therapy

## 2016-12-30 MED ORDER — RESOURCE THICKENUP CLEAR PO POWD: ORAL | Status: DC | PRN

## 2016-12-30 NOTE — Progress Notes (Addendum)
[facility] PHYSICAL MEDICINE & REHABILITATION     PROGRESS NOTE    Subjective/Complaints: Lying in bed. Just waking up. Slow to arouse  ROS: Limited due to cognitive/behavioral   Objective: Vital Signs: Blood pressure 116/71, pulse 72, temperature 98.4 F (36.9 C), temperature source Oral, resp. rate 18, SpO2 97 %. No results found.  Recent Labs  12/29/16 0429  WBC 7.0  HGB 9.3*  HCT 30.2*  PLT 333    Recent Labs  12/29/16 0429  NA 137  K 3.6  CL 106  GLUCOSE 107*  BUN 16  CREATININE 1.08  CALCIUM 9.0   CBG (last 3)  No results for input(s): GLUCAP in the last 72 hours.  Wt Readings from Last 3 Encounters:  12/25/16 (!) 189.1 kg (417 lb)  02/26/15 (!) 198.7 kg (438 lb)  02/19/15 (!) 198.7 kg (438 lb)    Physical Exam:  Constitutional:  obese HENT:  Head: Normocephalic.  Sclerae improving Eyes: EOMare normal.  Left crani scar dry and intact Neck:  Trach in place, #6 XLT--PMV. Some secretions present Cardiovascular: RRR Respiratory: llungs clear GI: Soft. Bowel sounds are normal. He exhibits no distension.  Musculoskeletal: He exhibits no edema.  Skin.bleeding, opening of scalp incision Neurological:  Slow to arouse this morning.  Moves all 4 limbs---strength 3-4/5 UE's and 2 HF, 3-KE and 3+ ADF/PF. sensespain in all 4.  Psych :more dynamic today, joking around.   Assessment/Plan: 1. Functional deficits secondary to left p-com aneurysm which require 3+ hours per day of interdisciplinary therapy in a comprehensive inpatient rehab setting. Physiatrist is providing close team supervision and 24 hour management of active medical problems listed below. Physiatrist and rehab team continue to assess barriers to discharge/monitor patient progress toward functional and medical goals.  Function:  Bathing Bathing position   Position: Wheelchair/chair at sink  Bathing parts Body parts bathed by patient: Right arm, Left arm, Chest, Abdomen, Front  perineal area Body parts bathed by helper: Buttocks, Back  Bathing assist Assist Level: Touching or steadying assistance(Pt > 75%) (5/6)      Upper Body Dressing/Undressing Upper body dressing   What is the patient wearing?: Hospital gown                Upper body assist Assist Level: Set up, Touching or steadying assistance(Pt > 75%)   Set up : To obtain clothing/put away  Lower Body Dressing/Undressing Lower body dressing   What is the patient wearing?: Non-skid slipper socks           Non-skid slipper socks- Performed by helper: Don/doff right sock, Don/doff left sock Socks - Performed by patient: Don/doff right sock Socks - Performed by helper: Don/doff left sock              Lower body assist        Toileting Toileting     Toileting steps completed by helper: Performs perineal hygiene (in shower on BSC)    Toileting assist Assist level:  (TOTAL A)   Transfers Chair/bed transfer   Chair/bed transfer method: Stand pivot Chair/bed transfer assist level: Touching or steadying assistance (Pt > 75%) Chair/bed transfer assistive device: Patent attorneyWalker     Locomotion Ambulation     Max distance: 6 ft Assist level: Touching or steadying assistance (Pt > 75%)   Wheelchair Wheelchair activity did not occur: Refused Type: Manual Max wheelchair distance: 100 ft Assist Level: Supervision or verbal cues  Cognition Comprehension Comprehension assist level: Follows basic conversation/direction with no assist  Expression Expression assist level: Expresses basic 50 - 74% of the time/requires cueing 25 - 49% of the time. Needs to repeat parts of sentences.  Social Interaction Social Interaction assist level: Interacts appropriately 90% of the time - Needs monitoring or encouragement for participation or interaction.  Problem Solving Problem solving assist level: Solves basic 90% of the time/requires cueing < 10% of the time  Memory Memory assist level: Recognizes or  recalls 75 - 89% of the time/requires cueing 10 - 24% of the time  Medical Problem List and Plan: 1. Motor, cognitive, functional deficitssecondary to Ruptured left posterior communicating artery aneurysm status post clipping -continue CIR therapies  -team conf today 2. DVT Prophylaxis/Anticoagulation: Left popliteal vein as well as left posterior tibial vein DVT. Status post IVC filter 12/11/2016 per interventional radiology 3. Pain Management: Tylenol as needed. Denies any pain or headache at present 4. Mood: Provide emotional support 5. Neuropsych: This patient is intermittentlycapable of making decisions on hisown behalf.  -pt is generally more alert 6. Skin/Wound Care: Routine skin checks  -local care to scalp incision 7. Fluids/Electrolytes/Nutrition:  -encourage PO, intake fair  -continue on HS IVF for now  -I personally reviewed the patient's labs today.   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 Speech therapy follow-up PMV. Presently with a #8 XLTin place -phonating well.   -downsized to #6 XLT shiley 6/4 9.Dysphagia. Dysphagia #3 honey thick liquids. Follow speech therapy. Monitor hydration while on honeys 10.Hypertension. Monitor with increased mobility 11.Morbid obesity. BMI 56.35 kg Dietary follow-up 12.MRSA PCR screen positive. Contact precautions 13.UTI.Empiric Bactrim, UCX multispecies--re-culture with multispecies---dc bactrim     LOS (Days) 4 A FACE TO FACE EVALUATION WAS PERFORMED  Faith Rogue T, MD 12/30/2016 9:08 AM

## 2016-12-30 NOTE — Progress Notes (Signed)
Patient refusing bowel meds at this time.

## 2016-12-30 NOTE — Progress Notes (Signed)
Speech Language Pathology Daily Session Note  Patient Details  Name: Bryan [name] MRN: [id number] Date of Birth: 1981/07/19  Today's Date: 12/30/2016 SLP Individual Time: 1330-1400 SLP Individual Time Calculation (min): 30 min  Short Term Goals: Week 1: SLP Short Term Goal 1 (Week 1): Pt will consume dys 3 textures and honey thick liquids with supervision cues for use of swallowing precautions.  SLP Short Term Goal 2 (Week 1): Pt will consume therapeutic trials of ice chips to continue working towards repeat objective assessment with supervision cues for use of swallowing precautions.  SLP Short Term Goal 3 (Week 1): Pt will return demonstration of safe speaking valve use with min question cues.   SLP Short Term Goal 4 (Week 1): Pt will increase vocal intensity to achieve intelligibility in sentences with min assist.   SLP Short Term Goal 5 (Week 1): Pt will Pt will increase functional cognitive skills for short term recall of info at the word/sentence/paragraph level after 5 minutes with 90% acc given min cues.  SLP Short Term Goal 6 (Week 1): Pt will increase funtional cognive abstract reasoning/mental flexibility skills for providing 3 appropriate answers to for vairous safety awareness scenarios with 90% acc with min assist.   Skilled Therapeutic Interventions: Skilled treatment session focused on addressing dysphagia goals.  Patient now with a downsized trach #6 XL cuffless and tolerated PMSV throughout session.  SLP facilitated session by providing set-up assist for oral care at sink and then consumed trials of ice chips with no overt s/s of aspiration.  Trials advanced to thin liquids via cup with no initial overt s/s of aspiration; however, after a couple minutes delayed throat clears were noted.  As a result, recommend initiation of the [employer] Free Water Protocol to prepare for objective assessment.  Continue with current plan of care.    Function:  Eating Eating   Modified  Consistency Diet: No (trials with SLP ) Eating Assist Level: More than reasonable amount of time;Supervision or verbal cues;Set up assist for   Eating Set Up Assist For: Opening containers       Cognition Comprehension Comprehension assist level: Understands complex 90% of the time/cues 10% of the time  Expression   Expression assist level: Expresses basic 90% of the time/requires cueing < 10% of the time.  Social Interaction Social Interaction assist level: Interacts appropriately 90% of the time - Needs monitoring or encouragement for participation or interaction.  Problem Solving Problem solving assist level: Solves basic 90% of the time/requires cueing < 10% of the time  Memory Memory assist level: Recognizes or recalls 90% of the time/requires cueing < 10% of the time    Pain Pain Assessment Pain Assessment: No/denies pain  Therapy/Group: Individual Therapy  Charlane FerrettiMelissa Twain Stenseth, M.A., CCC-SLP [phone]  [name] 12/30/2016, 2:13 PM

## 2016-12-30 NOTE — Plan of Care (Signed)
Problem: RH Balance Goal: LTG Patient will maintain dynamic standing balance (PT) LTG:  Patient will maintain dynamic standing balance with assistance during mobility activities (PT)  With LRAD; downgrade 2/2 cognition  Problem: RH Bed to Chair Transfers Goal: LTG Patient will perform bed/chair transfers w/assist (PT) LTG: Patient will perform bed/chair transfers with assistance, with/without cues (PT).  With LRAD; downgrade 2/2 cognition

## 2016-12-30 NOTE — Progress Notes (Signed)
Occupational Therapy Session Note  Patient Details  Name: Bryan [name] MRN: [id number] Date of Birth: 1980-08-29  Today's Date: 12/30/2016 OT Individual Time: [id number] OT Individual Time Calculation (min): 30 min    Short Term Goals: Week 1:  OT Short Term Goal 1 (Week 1): Pt will sit to stand with supervision in prep for clohting managment OT Short Term Goal 2 (Week 1): Pt will don pants with MIN A. OT Short Term Goal 3 (Week 1): Pt will don footwear with MIN A and AE prn OT Short Term Goal 4 (Week 1): Pt will toilet with MAX A OT Short Term Goal 5 (Week 1): [facility] A  Skilled Therapeutic Interventions/Progress Updates:    Treatment session with focus on functional mobility and navigating obstacles with and without RW.  Pt donned shoes with setup prior to ambulation. Pt ambulated 150 feet to therapy gym with RW with cues for safety due to quick speed.  Engaged in obstacle course with stepping over and around obstacles progressing to without RW.  Supervision with RW, however pt bumping into items due to width of RW.  Min guard without RW with therapist managing pants due to poor fit, pt with improved stepping around and over obstacles including 6" block.  Monitored O2 sats and HR, with HR elevated to 123 post activity, returning to high 110s with rest.  O2 maintained 98-99% on RA.  Returned to room with RW with supervision.  Left upright in w/c with all needs in reach.  Therapy Documentation Precautions:  Precautions Precautions: Fall Precaution Comments: trach, monitor HR and O2 sats Restrictions Weight Bearing Restrictions: No Pain:  Pt with no c/o pain  See Function Navigator for Current Functional Status.   Therapy/Group: Individual Therapy  Rosalio LoudHOXIE, Bryan [name] 12/30/2016, 12:23 PM

## 2016-12-30 NOTE — Progress Notes (Signed)
Physical Therapy Session Note  Patient Details  Name: Bryan [name] MRN: [id number] Date of Birth: January 18, 1981  Today's Date: 12/30/2016 PT Individual Time: 0830-0900 PT Individual Time Calculation (min): 30 min   Skilled Therapeutic Interventions/Progress Updates:  Pt received in bed & agreeable to tx. Unable to locate PMSV & RN & SLP made aware. Pt provided with new speaking valve and donned it for entire session. Session focused on functional mobility (bed mobility, transfers, gait). Pt transferred supine>sitting EOB with supervision and use of hospital bed features. Pt completes sit<>stand overall with supervision and armrests. Pt ambulates 5 ft bed>w/c without AD and min assist 2/2 unsteady gait; task focused on high level dynamic balance. Pt propels w/c room<>gym with BLE for strengthening purposes with slightly fewer rest breaks compared to yesterday. In gym pt ambulated 200 ft with RW & supervision for linear path, requiring min assist for turning x 1 occasion 2/2 unsteadiness. At end of session pt left sitting in w/c in room with all needs within reach.   During session pt appears to have impaired short term memory/recall.   Therapy Documentation Precautions:  Precautions Precautions: Fall Precaution Comments: trach, monitor HR and O2 sats Restrictions Weight Bearing Restrictions: No   General: PT Amount of Missed Time (min): 30 Minutes PT Missed Treatment Reason:  (assisting pt with obtaining new PMSV)   Vital Signs: SpO2 >90% on room air with PMSV donned.  HR = 108 bpm after ambulating 200 ft  Pain: Denies c/o pain.   See Function Navigator for Current Functional Status.   Therapy/Group: Individual Therapy  Sandi MariscalVictoria M Herbert Aguinaldo 12/30/2016, 9:20 AM

## 2016-12-30 NOTE — Plan of Care (Signed)
Problem: RH BOWEL ELIMINATION Goal: RH STG MANAGE BOWEL WITH ASSISTANCE STG Manage Bowel with min Assistance.  Outcome: Not Progressing incont episodes in shower and during therapy sessions Monday

## 2016-12-30 NOTE — Plan of Care (Signed)
Problem: RH BLADDER ELIMINATION Goal: RH STG MANAGE BLADDER WITH ASSISTANCE STG Manage Bladder With mod I Assistance  Outcome: Not Progressing incont at HS and during therapy Monday, toileted before shower today and maintained continence

## 2016-12-30 NOTE — Progress Notes (Signed)
Occupational Therapy Session Note  Patient Details  Name: Bryan [name] MRN: [id number] Date of Birth: 12-25-1980  Today's Date: 12/30/2016 OT Individual Time: 0900-1029 OT Individual Time Calculation (min): 89 min    Short Term Goals: Week 1:  OT Short Term Goal 1 (Week 1): Pt will sit to stand with supervision in prep for clohting managment OT Short Term Goal 2 (Week 1): Pt will don pants with MIN A. OT Short Term Goal 3 (Week 1): Pt will don footwear with MIN A and AE prn OT Short Term Goal 4 (Week 1): Pt will toilet with MAX A OT Short Term Goal 5 (Week 1): [facility]  Skilled Therapeutic Interventions/Progress Updates:    Upon entering the room, pt seated in wheelchair obtaining medications from RN. Pt agreeable to OT intervention and with no c/o pain. Pt ambulates with RW 10' into bathroom with steady assistance. Stand pivot transfer onto elevated toilet seat with bariatric BSC. Pt having BM and requiring assistance with clothing management and hygiene this session. Pt removing all clothing while seated and transferred onto bariatric BSC placed in shower. Trach guard donned and PICC covered.Pt bathing while seated with assistance to wash B LE's and buttocks. Pt ambulating back to wheelchair in room for dressing tasks. Pt able to thread pants on with set up A. Pt utilized sock aide with min verbal cues for technique to don B non slip socks. Pt refused shoes at this time. Pt seated and taking rest break. OT discussed recommendations for possible discharge equipment.  Family arriving and entering the room. Call bell and all needed items within reach.   Therapy Documentation Precautions:  Precautions Precautions: Fall Precaution Comments: trach, monitor HR and O2 sats Restrictions Weight Bearing Restrictions: No General:   Vital Signs: Therapy Vitals Temp: 98.6 F (37 C) Temp Source: Oral Pulse Rate: (!) 102 Resp: 18 BP: 123/78 Patient Position (if  appropriate): Sitting Oxygen Therapy SpO2: 97 % O2 Device: Not Delivered Pain: Pain Assessment Pain Assessment: No/denies pain  See Function Navigator for Current Functional Status.   Therapy/Group: Individual Therapy  Alen BleacherBradsher, Dare Sanger P 12/30/2016, 6:07 PM

## 2016-12-31 ENCOUNTER — Inpatient Hospital Stay (HOSPITAL_COMMUNITY): Payer: BLUE CROSS/BLUE SHIELD | Admitting: Occupational Therapy

## 2016-12-31 ENCOUNTER — Encounter (HOSPITAL_COMMUNITY): Payer: BLUE CROSS/BLUE SHIELD | Admitting: Psychology

## 2016-12-31 ENCOUNTER — Inpatient Hospital Stay (HOSPITAL_COMMUNITY): Payer: BLUE CROSS/BLUE SHIELD | Admitting: Speech Pathology

## 2016-12-31 ENCOUNTER — Inpatient Hospital Stay (HOSPITAL_COMMUNITY): Payer: BLUE CROSS/BLUE SHIELD | Admitting: Physical Therapy

## 2016-12-31 NOTE — Progress Notes (Signed)
Occupational Therapy Session Note  Patient Details  Name: Bryan [name] MRN: [id number] Date of Birth: 1981/04/10  Today's Date: 12/31/2016 OT Individual Time: [id number] OT Individual Time Calculation (min): 58 min    Short Term Goals: Week 1:  OT Short Term Goal 1 (Week 1): Pt will sit to stand with supervision in prep for clohting managment OT Short Term Goal 2 (Week 1): Pt will don pants with MIN A. OT Short Term Goal 3 (Week 1): Pt will don footwear with MIN A and AE prn OT Short Term Goal 4 (Week 1): Pt will toilet with MAX A OT Short Term Goal 5 (Week 1): [facility]  Skilled Therapeutic Interventions/Progress Updates:    Upon entering the room, pt seated in wheelchair awaiting therapist with no c/o pain. Pt O2 sats remaining at 98-99% throughout session as well as HR less than 105  with activity. Pt ambulating with steady assistance into bathroom without use of RW. Pt seated on bariatric BSC in shower for bathing at shower level. Pt required assistance to wash B LEs and buttocks. Pt ambulating to sit in wheelchair to don clothing items. Use of sock aide to don B socks with set up A for task. Pt engaged in 3 sets of 10 wheelchair push ups for B UE strengthening and endurance.  Therapy Documentation Precautions:  Precautions Precautions: Fall Precaution Comments: trach, monitor HR and O2 sats Restrictions Weight Bearing Restrictions: No Pain: Pain Assessment Pain Assessment: No/denies pain Pain Score: 0-No pain  See Function Navigator for Current Functional Status.   Therapy/Group: Individual Therapy  Alen BleacherBradsher, [name] 12/31/2016, 10:02 AM

## 2016-12-31 NOTE — Progress Notes (Signed)
Social Work Patient ID: Bryan [name], male   DOB: 23-Aug-1980, 36 y.o.   MRN: [id number]   Met with pt and mother yesterday afternoon to review team conference.  Both aware and agreeable with targeted d/c date for 6/14 at mod ind - supervision level. Pleased with daily progress and deny any further concerns at this time.  [name], LCSW

## 2016-12-31 NOTE — Consult Note (Signed)
Neuropsychological Consultation   Patient:   Bryan [name]   DOB:   1981/02/23  MR Number:  [id number]  Location:  [facility] [id number] [place] [address] Dept: [phone] Loc: [phone]           Date of Service:   12/31/2016  Start Time:   3:50 PM End Time:   4:50 PM  Provider/Observer:  Arley Phenix, Psy.D.       Clinical Neuropsychologist       Billing Code/Service: [phone] [id number] Units  Chief Complaint:    Patient has had a variable mental status with disorientation at times.  Has been less intense and frequent past few days.  Reason for Service:  Bryan [name] was referred for neuropsychological consultation due to variations in mental status.  He has been improving but wanted consult to assess.  Bellow is the HPI for current admission.  HPI: Bryan [name] a 36 y.o.right handed malewith morbid obesity on no prescription medications. Per chart review patient lives with parent independent prior to admission. Presented 12/01/2016 with sudden onset of headache. No recent fall or trauma reported. No history of seizures. 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 Conchita Paris. Hospital course requiring tracheostomy 12/15/2016 per Dr. Pollyann Kennedy and speech therapy follow-up with [facility]. Findings of acute DVT left popliteal vein as well as left posterior tibial veins and underwent placement of IVC filter per interventional  radiology05/17/2018. Tube feeds for nutritional supportwith diet advanced to dysphagia #3 honey thick liquids.MRSA PCR screen positive placed on contact precautions. Urine study with many bacteria, nitrite negative placed on Bactrim empirically.Physical and occupational therapy evaluations completed 12/16/2016 with recommendations of physical medicine rehabilitation consult.Patient was admitted for a comprehensive rehabilitation program  Current Status:  The patient was well oriented durring the visit.  MMSE was 29 of 30.  Reports that he was aware that there were times he was confused but reports has improved and not been case past couple days.  Behavioral Observation: Bryan [name]  presents as a 36 y.o.-year-old Right African American Male who appeared his stated age. his dress was Appropriate and he was Well Groomed and his manners were Appropriate to the situation.  his participation was indicative of Appropriate and Attentive behaviors.  There were not any physical disabilities noted.  he displayed an appropriate level of cooperation and motivation.     Interactions:    Active Appropriate and Attentive  Attention:   within normal limits and attention span and concentration were age appropriate  Memory:   within normal limits; recent and remote memory intact  Visuo-spatial:  within normal limits  Speech (Volume):  normal  Speech:   normal;   Thought Process:  Coherent and Relevant  Though Content:  WNL; not suicidal  Orientation:   person, place, time/date and situation  Judgment:   Fair  Planning:   Fair  Affect:    Appropriate  Mood:    No indications of depression or anxiety or other disruptive mood states.  Insight:   Fair  Intelligence:   normal  Marital Status/Living:  The patient is living with grandparents and fixing up a trailer on their property for him to move into.  Current Employment: Patient works as Copyfloor forman for [employer]  Past  Employment:    Substance Use:  No concerns of substance abuse are reported.    Education:   HS Graduate  Medical History:   Past Medical History:  Diagnosis Date  . Complication of anesthesia    mom states they almost lost him but not sure if he was given to much medication  . Varicose veins    mutilple bleeding episodes, bilateral lower extremities)       Psychiatric History:  No past history of psychiatric illness  Family Med/Psych History: No family history on file.  Risk of Suicide/Violence: virtually non-existent   Impression/DX:  Bryan [name] is 36 yo post status post aneurism bleed.  He has had times of variaying mental status and confusion.  It does appear to improvements have happened.  He was well oriented with good memory and displayed understanding as to what has happened to him and plans for discharge.  He is doing well today.          Electronically Signed   _______________________ Arley PhenixJohn [name], Psy.D.

## 2016-12-31 NOTE — Progress Notes (Signed)
Speech Language Pathology Daily Session Note  Patient Details  Name: Kem KaysDarren D Koenigs MRN: [id number] Date of Birth: 1981-07-15  Today's Date: 12/31/2016 SLP Individual Time: [id number] SLP Individual Time Calculation (min): 42 min  Short Term Goals: Week 1: SLP Short Term Goal 1 (Week 1): Pt will consume dys 3 textures and honey thick liquids with supervision cues for use of swallowing precautions.  SLP Short Term Goal 2 (Week 1): Pt will consume therapeutic trials of ice chips to continue working towards repeat objective assessment with supervision cues for use of swallowing precautions.  SLP Short Term Goal 3 (Week 1): Pt will return demonstration of safe speaking valve use with min question cues.   SLP Short Term Goal 4 (Week 1): Pt will increase vocal intensity to achieve intelligibility in sentences with min assist.   SLP Short Term Goal 5 (Week 1): Pt will Pt will increase functional cognitive skills for short term recall of info at the word/sentence/paragraph level after 5 minutes with 90% acc given min cues.  SLP Short Term Goal 6 (Week 1): Pt will increase funtional cognive abstract reasoning/mental flexibility skills for providing 3 appropriate answers to for vairous safety awareness scenarios with 90% acc with min assist.   Skilled Therapeutic Interventions:  Pt was seen for skilled ST targeting cognitive and dysphagia goals.  Pt could recall 2 out of 3 parameters of the water protocol independently (wait 30 minutes after meals, water only) which improved to 3 out of 3 recall with min question cues.  Pt consumed ~6 oz of water without overt s/s of aspiration despite taking large consecutive cup sips.  Pt also consumed dys 3 textures and honey thick liquids on breakfast tray with supervision cues for use of swallowing precautions with delayed coughing which SLP suspects to be related to bolus size.  Pt was unable to identify any cognitive changes since BI so SLP reviewed and reinforced results  of cognitive assessment and recommendations for implementation of cognitive therapy.   SLP provided skilled education regarding memory compensatory strategies with pt able to identify at least 3 strategies that he utilized prior to admission.  Pt was left in wheelchair at the end of today's therapy session with call bell within reach.  Continue per current plan of care.       Function:  Eating Eating   Modified Consistency Diet: Yes Eating Assist Level: Supervision or verbal cues           Cognition Comprehension Comprehension assist level: Understands complex 90% of the time/cues 10% of the time  Expression   Expression assist level: Expresses basic 90% of the time/requires cueing < 10% of the time.  Social Interaction Social Interaction assist level: Interacts appropriately 90% of the time - Needs monitoring or encouragement for participation or interaction.  Problem Solving Problem solving assist level: Solves basic 75 - 89% of the time/requires cueing 10 - 24% of the time  Memory Memory assist level: Recognizes or recalls 75 - 89% of the time/requires cueing 10 - 24% of the time    Pain Pain Assessment Pain Assessment: No/denies pain  Therapy/Group: Individual Therapy  Aqua Denslow, Melanee SpryNicole L 12/31/2016, 8:49 AM

## 2016-12-31 NOTE — Progress Notes (Signed)
Physical Therapy Weekly Progress Note  Patient Details  Name: Bryan [name] MRN: [id number] Date of Birth: 27-Feb-1981  Beginning of progress report period: December 27, 2016 End of progress report period: January 01, 2017  Today's Date: 01/01/2017   Patient has met 2 of 2 short term goals.  Pt is making good progress towards LTGs and has progressed from ambulating with a RW to ambulating without an AD and supervision overall. Pt would benefit from continued skilled PT treatment to focus on endurance training, balance training, cognitive remediation, gait training, and to facilitate d/c planning.   Patient continues to demonstrate the following deficits muscle weakness, decreased cardiorespiratory endurance, decreased coordination, decreased memory, and decreased standing balance and decreased balance strategies and therefore will continue to benefit from skilled PT intervention to increase functional independence with mobility.  Patient progressing toward long term goals..  Continue plan of care.  PT Short Term Goals Week 1:  PT Short Term Goal 1 (Week 1): Pt will negotiate 4 steps for strengthening purposes. PT Short Term Goal 1 - Progress (Week 1): Met PT Short Term Goal 2 (Week 1): Pt will tolerate standing x 5 minutes to increase activity tolerance. PT Short Term Goal 2 - Progress (Week 1): Met Week 2:  PT Short Term Goal 1 (Week 2): STG = LTG due to estimated d/c date.    Therapy Documentation Precautions:  Precautions Precautions: Fall Precaution Comments: trach, monitor HR and O2 sats Restrictions Weight Bearing Restrictions: No   See Function Navigator for Current Functional Status.  Therapy/Group: Individual Therapy  Waunita Schooner 01/01/2017, 7:58 AM

## 2016-12-31 NOTE — Progress Notes (Signed)
[facility] PHYSICAL MEDICINE & REHABILITATION     PROGRESS NOTE    Subjective/Complaints: Up in chair. Very alert. Talkative. Asked him if her remembered me from yesterday and he said yes but had incorrect person  ROS: pt denies nausea, vomiting, diarrhea, cough, shortness of breath or chest pain   Objective: Vital Signs: Blood pressure 118/65, pulse 92, temperature 98.4 F (36.9 C), temperature source Oral, resp. rate 18, SpO2 94 %. No results found.  Recent Labs  12/29/16 0429  WBC 7.0  HGB 9.3*  HCT 30.2*  PLT 333    Recent Labs  12/29/16 0429  NA 137  K 3.6  CL 106  GLUCOSE 107*  BUN 16  CREATININE 1.08  CALCIUM 9.0   CBG (last 3)  No results for input(s): GLUCAP in the last 72 hours.  Wt Readings from Last 3 Encounters:  12/25/16 (!) 189.1 kg (417 lb)  02/26/15 (!) 198.7 kg (438 lb)  02/19/15 (!) 198.7 kg (438 lb)    Physical Exam:  Constitutional:  obese HENT:  Head: Normocephalic.  Sclerae improving. Swelling around left scalp and eye is stable Eyes: EOMare normal.  Left crani scar dry and intact Neck:  Trach in place, #6 XLT--PMV.  Good phonation Cardiovascular: RRR Respiratory: llungs clear GI: Soft. Bowel sounds are normal. He exhibits no distension.  Musculoskeletal: He exhibits no edema.  Skin.bleeding, opening of scalp incision Neurological:  Very alert.  Moves all 4 limbs---strength 4/5 UE's and 3 HF, 3KE and 3+ ADF/PF. sensespain in all 4.  Psych :pleasant and jovial  Assessment/Plan: 1. Functional deficits secondary to left p-com aneurysm which require 3+ hours per day of interdisciplinary therapy in a comprehensive inpatient rehab setting. Physiatrist is providing close team supervision and 24 hour management of active medical problems listed below. Physiatrist and rehab team continue to assess barriers to discharge/monitor patient progress toward functional and medical goals.  Function:  Bathing Bathing position    Position: Shower  Bathing parts Body parts bathed by patient: Right arm, Left arm, Chest, Abdomen, Front perineal area, Right upper leg, Left upper leg Body parts bathed by helper: Buttocks, Right lower leg, Left lower leg, Back  Bathing assist Assist Level:  (mod A)      Upper Body Dressing/Undressing Upper body dressing   What is the patient wearing?: Hospital gown                Upper body assist Assist Level: Set up, Supervision or verbal cues   Set up : To obtain clothing/put away  Lower Body Dressing/Undressing Lower body dressing   What is the patient wearing?: Non-skid slipper socks, Underwear, Pants Underwear - Performed by patient: Thread/unthread right underwear leg, Thread/unthread left underwear leg, Pull underwear up/down   Pants- Performed by patient: Thread/unthread right pants leg, Thread/unthread left pants leg, Pull pants up/down   Non-skid slipper socks- Performed by patient: Don/doff right sock, Don/doff left sock Non-skid slipper socks- Performed by helper: Don/doff right sock, Don/doff left sock Socks - Performed by patient: Don/doff right sock Socks - Performed by helper: Don/doff left sock              Lower body assist Assist for lower body dressing: Assistive device, Touching or steadying assistance (Pt > 75%) Assistive Device Comment: sock aid    Toileting Toileting   Toileting steps completed by patient: Adjust clothing prior to toileting Toileting steps completed by helper: Performs perineal hygiene, Adjust clothing after toileting    Toileting assist Assist  level:  (max A)   Transfers Chair/bed transfer   Chair/bed transfer method: Ambulatory Chair/bed transfer assist level: Touching or steadying assistance (Pt > 75%) Chair/bed transfer assistive device: Patent attorneyWalker     Locomotion Ambulation     Max distance: 200 ft Assist level: Supervision or verbal cues   Wheelchair Wheelchair activity did not occur: Refused Type: Manual Max  wheelchair distance: 100 ft (BLE) Assist Level: Supervision or verbal cues  Cognition Comprehension Comprehension assist level: Understands complex 90% of the time/cues 10% of the time  Expression Expression assist level: Expresses basic 90% of the time/requires cueing < 10% of the time.  Social Interaction Social Interaction assist level: Interacts appropriately 90% of the time - Needs monitoring or encouragement for participation or interaction.  Problem Solving Problem solving assist level: Solves basic 75 - 89% of the time/requires cueing 10 - 24% of the time  Memory Memory assist level: Recognizes or recalls 75 - 89% of the time/requires cueing 10 - 24% of the time  Medical Problem List and Plan: 1. 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 DVT Prophylaxis/Anticoagulation: Left popliteal vein as well as left posterior tibial vein DVT. Status post IVC filter 12/11/2016 per interventional radiology 3. Pain Management: Tylenol as needed. Denies any pain or headache at present 4. Mood: Provide emotional support 5. Neuropsych: This patient is intermittentlycapable of making decisions on hisown behalf.  -pt is generally more alert 6. Skin/Wound Care: Routine skin checks  -local care to scalp incision 7. Fluids/Electrolytes/Nutrition:  -encourage PO, intake fair  -continue on HS IVF while on honeys   8.Tracheostomy 12/15/2016. Speech therapy follow-up PMV. Presently with a #8 XLTin place -phonating well.   -downsized to #6 XLT shiley 6/4---don't think ther'es an xl #4 trach---may have to cap and decannulate from there 9.Dysphagia. Dysphagia #3 honey thick liquids.   -now on water protocol 10.Hypertension. Monitor with increased mobility 11.Morbid obesity. BMI 56.35 kg Dietary follow-up 12.MRSA PCR screen positive. Contact  precautions 13.UTI.Empiric Bactrim, UCX multispecies--re-culture with multispecies---dc bactrim     LOS (Days) 5 A FACE TO FACE EVALUATION WAS PERFORMED  Faith RogueSWARTZ,Priseis Cratty T, MD 12/31/2016 9:13 AM

## 2016-12-31 NOTE — Progress Notes (Signed)
Speech Language Pathology Daily Session Note  Patient Details  Name: Bryan [name] MRN: [id number] Date of Birth: 1980-09-18  Today's Date: 12/31/2016 SLP Individual Time: 1030-1100 SLP Individual Time Calculation (min): 30 min  Short Term Goals: Week 1: SLP Short Term Goal 1 (Week 1): Pt will consume dys 3 textures and honey thick liquids with supervision cues for use of swallowing precautions.  SLP Short Term Goal 2 (Week 1): Pt will consume therapeutic trials of ice chips to continue working towards repeat objective assessment with supervision cues for use of swallowing precautions.  SLP Short Term Goal 3 (Week 1): Pt will return demonstration of safe speaking valve use with min question cues.   SLP Short Term Goal 4 (Week 1): Pt will increase vocal intensity to achieve intelligibility in sentences with min assist.   SLP Short Term Goal 5 (Week 1): Pt will Pt will increase functional cognitive skills for short term recall of info at the word/sentence/paragraph level after 5 minutes with 90% acc given min cues.  SLP Short Term Goal 6 (Week 1): Pt will increase funtional cognive abstract reasoning/mental flexibility skills for providing 3 appropriate answers to for vairous safety awareness scenarios with 90% acc with min assist.   Skilled Therapeutic Interventions: Skilled treatment session focused on cognitive goals. SLP facilitated session by providing Min A verbal cues for problem solving during a mildly complex money management task. Patient also recalled functional information in regards to repeat FEES tomorrow and potential discharge date with supervision verbal cues. Patient left upright in wheelchair with family present. Continue with current plan of care.      Function:  Eating Eating   Modified Consistency Diet: Yes Eating Assist Level: Supervision or verbal cues           Cognition Comprehension Comprehension assist level: Understands complex 90% of the time/cues 10% of the  time  Expression   Expression assist level: Expresses basic 90% of the time/requires cueing < 10% of the time.  Social Interaction Social Interaction assist level: Interacts appropriately 90% of the time - Needs monitoring or encouragement for participation or interaction.  Problem Solving Problem solving assist level: Solves basic 75 - 89% of the time/requires cueing 10 - 24% of the time  Memory Memory assist level: Recognizes or recalls 75 - 89% of the time/requires cueing 10 - 24% of the time    Pain Pain Assessment Pain Assessment: No/denies pain Pain Score: 0-No pain  Therapy/Group: Individual Therapy  Esha Fincher 12/31/2016, 11:12 AM

## 2016-12-31 NOTE — Patient Care Conference (Signed)
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 Tracheostomy status (HCC)   . Oropharyngeal dysphagia   . 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 Pressure injury of skin 12/25/2016  . Encounter for orogastric (OG) tube placement   . Bad headache   . Syncope   . Endotracheally intubated   . Essential hypertension   . Respiratory failure with hypoxia (HCC)   . SAH (subarachnoid hemorrhage) (HCC) 12/01/2016    Expected Discharge Date: Expected Discharge Date: 01/08/17  Team Members Present: Physician leading conference: Dr. Faith RogueZachary Swartz Social Worker Present: Amada JupiterLucy Jacquelyn Antony, LCSW Nurse Present: Chana Bodeeborah Sharp, RN PT Present: Aleda GranaVictoria Miller, PT OT Present: Callie FieldingKatie Pittman, OT SLP Present: Jackalyn LombardNicole Page, SLP PPS Coordinator present : Tora DuckMarie Noel, RN, CRRN     Current Status/Progress Goal Weekly Team Focus  Medical   [facility]. level of arousal has waxed and waned. trach in place. eating  downsize trach,remove, increase arousal  nutrition, trach mgt, bp control, sleep/wake   Bowel/Bladder   Continent of B&B during the day. Incontinent of urine HS.  To keep clean and dry when incontinent.  Monitor for incontinence.   Swallow/Nutrition/  Hydration   Dys. 3 textures with honey-thick liquids, Full supervision  Supervision  trials of upgraded liquids to assess readiness for repeat FEES   ADL's   steady assistance functional transfers, set up A UB self care, mod A LB self care, Toileting max A  mod I - supervision  self care retraining, functional transfers, endurance, balance, B UE strengthening, pt/family education   Mobility   steady assist ambulation with RW, steady assist standing balance, supervision sit<>stand transfers  supervision/mod I overall  balance, activity tolerance, gait training, endurance, pt education   Communication   #6XL cuffless trach with PMSV during all waking hours  Supervision  increase vocal intensity to maximize intelligibility   Safety/Cognition/ Behavioral Observations  Min A  Supervision  complex problem solving, recall   Pain   No c/o pain  To keep patient relatively pain free.  Continue to assess for pain.   Skin   Small open area on right side of neck and scalp inscion is open with minimal sangious dng.  To heal.  On healing open areas, keeping them clean and dry and assessing for infection.    Rehab Goals Patient on target to meet rehab goals: Yes *See Care Plan and progress notes for long and short-term goals.  Barriers to Discharge: size, fluctuating cognitive status. trach needs    Possible Resolutions to Barriers:  normalize day/night. cogniitve stimulation, wean from trach    Discharge Planning/Teaching Needs:  Plans to d/c home with grandparents and other family providing any needed assistance.  Teaching ongoing.  Family here daily.   Team Discussion:  Trach downsized yesterday and tolerating PMSV well.  Cognition fluctuates.  H20 protocol begun.  Supervision with walker and min assist without.  Max assist for toileting due to body habitus.  Monitor facial swelling.    Revisions to Treatment Plan:  None   Continued Need for Acute Rehabilitation Level of Care: The patient  requires daily medical management by a physician with specialized training in physical medicine and rehabilitation for the following conditions: Daily direction of a multidisciplinary physical rehabilitation program to ensure safe treatment while eliciting the highest outcome that is of practical value to the patient.: Yes Daily medical management of patient stability for increased activity during participation in an intensive rehabilitation regime.: Yes Daily analysis of laboratory values and/or radiology reports with any subsequent need for medication adjustment of medical intervention for : Wound care problems;Pulmonary problems;Neurological problems;Post surgical problems  Dessire Grimes 12/31/2016, 2:32 PM

## 2016-12-31 NOTE — Progress Notes (Signed)
Physical Therapy Session Note  Patient Details  Name: Bryan [name] MRN: [id number] Date of Birth: 08-20-1980  Today's Date: 12/31/2016 PT Individual Time: 1347-1500 PT Individual Time Calculation (min): 73 min   Short Term Goals: Week 1:  PT Short Term Goal 1 (Week 1): Pt will negotiate 4 steps for strengthening purposes. PT Short Term Goal 2 (Week 1): Pt will tolerate standing x 5 minutes to increase activity tolerance.  Skilled Therapeutic Interventions/Progress Updates:  Pt received in room & agreeable to tx. Session focused on endurance/activity tolerance, strengthening, gait, and cognitive remediation. Pt propelled w/c room<>gym with BLE and supervision for BLE strengthening; pt requires very few rest breaks on this date which is a significant improvement from past few days. Pt ambulates 100 ft + 300 ft without AD and supervision. Pt negotiated 4 steps with B rails and supervision for BLE strengthening. Pt engaged in seated ball taps with 4# weighted bar with task focusing on UE strengthening & endurance training. Pt engaged in n-1 & n-2 card memory task with 90% accuracy and card matching game with task focusing on short term memory. At end of session pt left sitting in w/c in room with all needs within reach.    Therapy Documentation Precautions:  Precautions Precautions: Fall Precaution Comments: trach, monitor HR and O2 sats Restrictions Weight Bearing Restrictions: No  Pain: No pain reported.   See Function Navigator for Current Functional Status.   Therapy/Group: Individual Therapy  Sandi MariscalVictoria M Hallis [name] 12/31/2016, 3:23 PM

## 2017-01-01 ENCOUNTER — Inpatient Hospital Stay (HOSPITAL_COMMUNITY): Payer: BLUE CROSS/BLUE SHIELD | Admitting: Speech Pathology

## 2017-01-01 ENCOUNTER — Inpatient Hospital Stay (HOSPITAL_COMMUNITY): Payer: BLUE CROSS/BLUE SHIELD | Admitting: Physical Therapy

## 2017-01-01 ENCOUNTER — Encounter (HOSPITAL_COMMUNITY): Payer: Self-pay | Admitting: *Deleted

## 2017-01-01 ENCOUNTER — Inpatient Hospital Stay (HOSPITAL_COMMUNITY): Payer: BLUE CROSS/BLUE SHIELD | Admitting: Occupational Therapy

## 2017-01-01 ENCOUNTER — Inpatient Hospital Stay (HOSPITAL_COMMUNITY): Payer: BLUE CROSS/BLUE SHIELD

## 2017-01-01 NOTE — Progress Notes (Signed)
Physical Therapy Session Note  Patient Details  Name: Kem KaysDarren D Prude MRN: [id number] Date of Birth: 07-Dec-1980  Today's Date: 01/01/2017 PT Individual Time: [id number] PT Individual Time Calculation (min): 54 min   Short Term Goals: Week 2:  PT Short Term Goal 1 (Week 2): STG = LTG due to estimated d/c date.   Skilled Therapeutic Interventions/Progress Updates:  Pt received in room & agreeable to tx. Session focused on endurance training and memory. Pt engaged in scavenger hunt, requiring him to recall 4 items and locate them on the unit. Pt requires occasional cuing to ask for help to locate items. Pt also able to recall 4/4 items without assistance. Pt also watered plants in dayroom, negotiating obstacles and retrieving objects from floor with supervision overall. Pt able to ambulate up to 30 minutes at a time without a rest break. At end of session pt left sitting in w/c in room with all needs within reach.   Therapy Documentation Precautions:  Precautions Precautions: Fall Precaution Comments: trach, monitor HR and O2 sats Restrictions Weight Bearing Restrictions: No  Pain: No c/o pain noted.  See Function Navigator for Current Functional Status.   Therapy/Group: Individual Therapy  Sandi MariscalVictoria M Tatijana Bierly 01/01/2017, 12:24 PM

## 2017-01-01 NOTE — Progress Notes (Signed)
Occupational Therapy Session Note  Patient Details  Name: Bryan [name] MRN: [id number] Date of Birth: November 23, 1980  Today's Date: 01/01/2017 OT Individual Time: [id number] OT Individual Time Calculation (min): 58 min    Short Term Goals: Week 1:  OT Short Term Goal 1 (Week 1): Pt will sit to stand with supervision in prep for clohting managment OT Short Term Goal 2 (Week 1): Pt will don pants with MIN A. OT Short Term Goal 3 (Week 1): Pt will don footwear with MIN A and AE prn OT Short Term Goal 4 (Week 1): Pt will toilet with MAX A OT Short Term Goal 5 (Week 1): [facility]  Skilled Therapeutic Interventions/Progress Updates:     Upon entering the room, pt seated in wheelchair awaiting therapist. Pt ambulating 10' into bathroom and onto bariatric BSC for showering with min A for steady assistance with balance as pt intermittently stood to wash body. Pt needing cues to dry feet before returning to wheelchair to don hospital gown. Pt seated in wheelchair at sink for grooming tasks at mod I level. Pt requesting to ambulate for remainder of session. Pt ambulating 200' without use of AD and supervision for safety. Pt returning to wheelchair at end of session with call bell and all needed items within reach.   Therapy Documentation Precautions:  Precautions Precautions: Fall Precaution Comments: trach, monitor HR and O2 sats Restrictions Weight Bearing Restrictions: No General:   Vital Signs: Therapy Vitals Pulse Rate: 94 Resp: 18 Oxygen Therapy SpO2: 98 % O2 Device: Not Delivered  See Function Navigator for Current Functional Status.   Therapy/Group: Individual Therapy  Alen BleacherBradsher, Jakyia Gaccione P 01/01/2017, 12:35 PM

## 2017-01-01 NOTE — Progress Notes (Signed)
[place] PHYSICAL MEDICINE & REHABILITATION     PROGRESS NOTE    Subjective/Complaints: No new complaints. Feels well. Washing face at sink.   ROS: pt denies nausea, vomiting, diarrhea, cough, shortness of breath or chest pain  Objective: Vital Signs: Blood pressure 134/86, pulse 85, temperature 97.4 F (36.3 C), temperature source Oral, resp. rate 16, SpO2 97 %. No results found. No results for input(s): WBC, HGB, HCT, PLT in the last 72 hours. No results for input(s): NA, K, CL, GLUCOSE, BUN, CREATININE, CALCIUM in the last 72 hours.  Invalid input(s): CO CBG (last 3)  No results for input(s): GLUCAP in the last 72 hours.  Wt Readings from Last 3 Encounters:  12/25/16 (!) 189.1 kg (417 lb)  02/26/15 (!) 198.7 kg (438 lb)  02/19/15 (!) 198.7 kg (438 lb)    Physical Exam:  Constitutional:  obese HENT:  Head: Normocephalic.  Sclerae improving. Swelling around left scalp and eye is stable Eyes: EOMare normal.  Left crani scar dry and intact Neck:  Trach in place, #6 XLT--PMV.  Good phonation Cardiovascular:  RRR Respiratory: lungs clear GI: Soft. Bowel sounds are normal. He exhibits no distension.  Musculoskeletal: He exhibits no edema.  Skin.bleeding, opening of scalp incision Neurological:  Very alert.  Moves all 4 limbs---strength 4/5 UE's and 3 HF, 3KE and 3+ ADF/PF. sensespain in all 4.  Psych :pleasant and cooperative  Assessment/Plan: 1. Functional deficits secondary to left p-com aneurysm which require 3+ hours per day of interdisciplinary therapy in a comprehensive inpatient rehab setting. Physiatrist is providing close team supervision and 24 hour management of active medical problems listed below. Physiatrist and rehab team continue to assess barriers to discharge/monitor patient progress toward functional and medical goals.  Function:  Bathing Bathing position   Position: Shower  Bathing parts Body parts bathed by patient: Right arm, Left arm,  Chest, Abdomen, Front perineal area, Right upper leg, Left upper leg, Right lower leg, Left lower leg Body parts bathed by helper: Buttocks, Right lower leg, Left lower leg, Back  Bathing assist Assist Level: Touching or steadying assistance(Pt > 75%) (min a)      Upper Body Dressing/Undressing Upper body dressing   What is the patient wearing?: Hospital gown                Upper body assist Assist Level: Supervision or verbal cues   Set up : To obtain clothing/put away  Lower Body Dressing/Undressing Lower body dressing   What is the patient wearing?: Non-skid slipper socks, Hospital Gown Underwear - Performed by patient: Thread/unthread right underwear leg, Thread/unthread left underwear leg, Pull underwear up/down   Pants- Performed by patient: Thread/unthread right pants leg, Thread/unthread left pants leg, Pull pants up/down   Non-skid slipper socks- Performed by patient: Don/doff right sock, Don/doff left sock Non-skid slipper socks- Performed by helper: Don/doff right sock, Don/doff left sock Socks - Performed by patient: Don/doff right sock Socks - Performed by helper: Don/doff left sock              Lower body assist Assist for lower body dressing: Assistive device Assistive Device Comment: sock aid    Toileting Toileting   Toileting steps completed by patient: Adjust clothing prior to toileting Toileting steps completed by helper: Performs perineal hygiene, Adjust clothing after toileting Toileting Assistive Devices: Grab bar or rail  Toileting assist Assist level:  (max A)   Transfers Chair/bed transfer   Chair/bed transfer method: Ambulatory Chair/bed transfer assist level: Touching or  steadying assistance (Pt > 75%) Chair/bed transfer assistive device: Patent attorney     Max distance: 300 ft Assist level: Supervision or Careers adviser activity did not occur: Refused Type: Manual Max wheelchair distance:  150 ft (BLE) Assist Level: Supervision or verbal cues  Cognition Comprehension Comprehension assist level: Understands complex 90% of the time/cues 10% of the time  Expression Expression assist level: Expresses basic 90% of the time/requires cueing < 10% of the time.  Social Interaction Social Interaction assist level: Interacts appropriately 90% of the time - Needs monitoring or encouragement for participation or interaction.  Problem Solving Problem solving assist level: Solves basic 90% of the time/requires cueing < 10% of the time  Memory Memory assist level: Recognizes or recalls 90% of the time/requires cueing < 10% of the time  Medical Problem List and Plan: 1. 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 DVT Prophylaxis/Anticoagulation: Left popliteal vein as well as left posterior tibial vein DVT. Status post IVC filter 12/11/2016 per interventional radiology 3. Pain Management: Tylenol as needed. Denies any pain or headache at present 4. Mood: Provide emotional support 5. Neuropsych: This patient is intermittentlycapable of making decisions on hisown behalf.  -pt is generally more alert 6. Skin/Wound Care: Routine skin checks  -local care to scalp incision 7. Fluids/Electrolytes/Nutrition:  -encourage PO, intake fair  -continue on HS IVF while on honeys   8.Tracheostomy 12/15/2016. Speech therapy follow-up PMV. Presently with a #8 XLTin place -phonating well.   -downsized to #6 XLT shiley ---will cap trach 9.Dysphagia. Dysphagia #3 honey thick liquids.   -now on water protocol 10.Hypertension. Monitor with increased mobility 11.Morbid obesity. BMI 56.35 kg Dietary follow-up 12.MRSA PCR screen positive. Contact precautions 13.UTI.Empiric Bactrim, UCX multispecies--re-culture with multispecies--- off bactrim     LOS  (Days) 6 A FACE TO FACE EVALUATION WAS PERFORMED  Faith Rogue T, MD 01/01/2017 9:01 AM

## 2017-01-01 NOTE — Progress Notes (Signed)
Speech Language Pathology Daily Session Note  Patient Details  Name: Bryan [name] MRN: [id number] Date of Birth: 13-Apr-1981  Today's Date: 01/01/2017 SLP Individual Time: 1402-1430 SLP Individual Time Calculation (min): 28 min  Short Term Goals: Week 2: SLP Short Term Goal 1 (Week 2): Patient will consume current diet with minimal overt s/s of aspiration and Mod I for use of swallowing compensatory strategies.  SLP Short Term Goal 2 (Week 2): Patient will recall swallowing compenstory strategies with Mod I.  SLP Short Term Goal 3 (Week 2): Patient will recall new, daily information with supervision verbal cues.  SLP Short Term Goal 4 (Week 2): Patient will demonstrate functional problem solving with mildly complex tasks with supervision verbal cues.  SLP Short Term Goal 5 (Week 2): Patient will utilize speech intelligibility strategies (increased vocal intensity) at the sentence level with supervision verbal cues to achieve 90% intelligibility.   Skilled Therapeutic Interventions:  Pt was seen for skilled ST targeting cognitive goals.  Pt was able to recall his swallowing precautions with supervision question cues.  Given reports from earlier ST session that pt had difficulty utilizing swallowing precautions in the presence of distractions, SLP provided skilled education regarding distraction management techniques.  SLP also facilitated the session with functional conversations regarding discharge planning.  Pt needed min assist question cues to identify functional limitations and their impact on his independence in the home environment.  Pt was left in bed with his mother at bedside.  Continue per current plan of care.      Function:  Eating Eating                 Cognition Comprehension Comprehension assist level: Understands complex 90% of the time/cues 10% of the time  Expression   Expression assist level: Expresses basic 90% of the time/requires cueing < 10% of the time.   Social Interaction Social Interaction assist level: Interacts appropriately 90% of the time - Needs monitoring or encouragement for participation or interaction.  Problem Solving Problem solving assist level: Solves basic 90% of the time/requires cueing < 10% of the time  Memory Memory assist level: Recognizes or recalls 75 - 89% of the time/requires cueing 10 - 24% of the time    Pain Pain Assessment Pain Assessment: No/denies pain  Therapy/Group: Individual Therapy  Ivon Oelkers, Melanee SpryNicole L 01/01/2017, 9:38 PM

## 2017-01-01 NOTE — Procedures (Signed)
Trach capped at this time per MD order.  Pt tolerating well, no distress noted.  Strong voive, spo2 98% on RA

## 2017-01-01 NOTE — Procedures (Signed)
Objective Swallowing Evaluation: FEES-Fiberoptic Endoscopic Evaluation of Swallow  Patient Details  Name: Bryan [name] MRN: [id number] Date of Birth: 10-03-1980  Today's Date: 01/01/2017 Time:  -     Past Medical History:  Past Medical History:  Diagnosis Date  . Complication of anesthesia    mom states they almost lost him but not sure if he was given to much medication  . Varicose veins    mutilple bleeding episodes, bilateral lower extremities)   Past Surgical History:  Past Surgical History:  Procedure Laterality Date  . CRANIOTOMY Left 12/02/2016   Procedure: CRANIOTOMY FOR CLIPPING OF INTRACRANIAL ANEURYSM;  Surgeon: Lisbeth RenshawNundkumar, Neelesh, MD;  Location: [facility] OR;  Service: Neurosurgery;  Laterality: Left;  . IR FLUORO RM 30-60 MIN  12/02/2016  . IR IVC FILTER PLMT / S&I /IMG GUID/MOD SED  12/11/2016  . IR US GUIDE VASC ACCESS RIGHT  12/02/2016  . lipo around heart  at age 36   pt states its bc they could hear his  heart beat  . ORIF ANKLE FRACTURE Left 09/02/2012   Procedure: OPEN REDUCTION INTERNAL FIXATION (ORIF) ANKLE FRACTURE;  Surgeon: Valeria BatmanPeter W Whitfield, MD;  Location: [facility] OR;  Service: Orthopedics;  Laterality: Left;  OPEN REDUCTION INTERNAL FIXATION LEFT DISTAL FIBULA FRACTURE WITH REDUCTION FIXATION DISTAL TIBIA/FIBULAR DIASTASIS   . RADIOLOGY WITH ANESTHESIA N/A 12/02/2016   Procedure: RADIOLOGY WITH ANESTHESIA;  Surgeon: Lisbeth RenshawNundkumar, Neelesh, MD;  [facility];  Service: Radiology;  Laterality: N/A;  . TRACHEOSTOMY TUBE PLACEMENT N/A 12/15/2016   Procedure: TRACHEOSTOMY;  Surgeon: Serena Colonelosen, Jefry, MD;  [facility];  Service: ENT;  Laterality: N/A;   HPI:  Pt is a 36 y.o.maleadmitted 5/7 with SAH secondary to aneurysm, s/p clipping 5/8 and complicated by vasospasm. He became hypoxic requiring intubation 5/8 and trach 5/2. Pt had MBS on 5/29 recommending honey thick liquids, has since been admitted to inpatient rehab and is tolerating diet and water protocol. Repeat FEES for  upgrade. PMH includes mordbid obesity and varicose veins.     Recommendation/Prognosis  Clinical Impression:    Pt demonstrates improvement in airway protection during the swallow. Though bilateral ulcerations/indentation on vocal folds still observable, they are healing in comparison with prior FEES. Pt consistently adducts and approximates arytenoids to epiglottis during the swallow and protects airway. Timing of swallow and pharyngeal strength is WNL. Only remaining concern is mild spill of thin liquids from oral cavity post swallow that pools at the posterior commisure leading to trace silent penetration and coating of the cords with no sensation. A second swallow to clear oral residue and intermittent throat clear resolves this issue (pt does initaite a second swallow independently eventually, but could be trained to do this faster with a volitional swallow). Given very trace penetration, recommend pt resume a regular diet and thin liquids with the above compensatory strategies.   SLP Visit Diagnosis: Dysphagia, oropharyngeal phase (R13.12) Impact on safety and function: Mild aspiration risk   Swallow Evaluation Recommendations:       Prognosis:  Prognosis for Safe Diet Advancement: Good   Individuals Consulted: Consulted and Agree with Results and Recommendations: Patient      SLP Assessment/Plan  Plan:      Short Term Goals: Week 1: SLP Short Term Goal 1 (Week 1): Pt will consume dys 3 textures and honey thick liquids with supervision cues for use of swallowing precautions.  SLP Short Term Goal 2 (Week 1): Pt will consume therapeutic trials of ice chips to continue working  towards repeat objective assessment with supervision cues for use of swallowing precautions.  SLP Short Term Goal 3 (Week 1): Pt will return demonstration of safe speaking valve use with min question cues.   SLP Short Term Goal 4 (Week 1): Pt will increase vocal intensity to achieve intelligibility in  sentences with min assist.   SLP Short Term Goal 5 (Week 1): Pt will Pt will increase functional cognitive skills for short term recall of info at the word/sentence/paragraph level after 5 minutes with 90% acc given min cues.  SLP Short Term Goal 6 (Week 1): Pt will increase funtional cognive abstract reasoning/mental flexibility skills for providing 3 appropriate answers to for vairous safety awareness scenarios with 90% acc with min assist.     General: Date of Onset: 01/01/17 Type of Study: FEES-Fiberoptic Endoscopic Evaluation of Swallow Previous Swallow Assessment: FEES 5/29 Diet Prior to this Study: Dysphagia 3 (soft);Honey-thick liquids Trach Size and Type: Cuff;#6;Extra long;Deflated;With PMSV in place History of Recent Intubation: Yes Length of Intubations (days): 14 days Date extubated: 12/15/16 Oral Cavity - Dentition: Adequate natural dentition Self-Feeding Abilities: Able to feed self Baseline Vocal Quality: Hoarse Volitional Cough: Strong Volitional Swallow: Able to elicit Anatomy: Other (Comment) (healing bilateral ulcerations/indentation in posterior VC) Pharyngeal Secretions: Normal   Reason for Referral:        Oral Phase: Oral Phase - Comment Oral Phase - Comment: unsure of location of residue, likely buccal cavities   Pharyngeal Phase:      Cervical Esophageal Phase      GN          Brodin Gelpi, [name] 01/01/2017, 10:13 AM

## 2017-01-01 NOTE — Progress Notes (Signed)
Speech Language Pathology Weekly Progress and Session Note  Patient Details  Name: Bryan [name] MRN: [id number] Date of Birth: 08-Jul-1981  Beginning of progress report period: December 26, 2016 End of progress report period: January 02, 2017  Today's Date: 01/01/2017 SLP Individual Time: 1300-1330 SLP Individual Time Calculation (min): 30 min  Short Term Goals: Week 1: SLP Short Term Goal 1 (Week 1): Pt will consume dys 3 textures and honey thick liquids with supervision cues for use of swallowing precautions.  SLP Short Term Goal 1 - Progress (Week 1): Met SLP Short Term Goal 2 (Week 1): Pt will consume therapeutic trials of ice chips to continue working towards repeat objective assessment with supervision cues for use of swallowing precautions.  SLP Short Term Goal 2 - Progress (Week 1): Met SLP Short Term Goal 3 (Week 1): Pt will return demonstration of safe speaking valve use with min question cues.   SLP Short Term Goal 3 - Progress (Week 1): Met SLP Short Term Goal 4 (Week 1): Pt will increase vocal intensity to achieve intelligibility in sentences with min assist.   SLP Short Term Goal 4 - Progress (Week 1): Met SLP Short Term Goal 5 (Week 1): Pt will Pt will increase functional cognitive skills for short term recall of info at the word/sentence/paragraph level after 5 minutes with 90% acc given min cues.  SLP Short Term Goal 5 - Progress (Week 1): Met SLP Short Term Goal 6 (Week 1): Pt will increase funtional cognive abstract reasoning/mental flexibility skills for providing 3 appropriate answers to for vairous safety awareness scenarios with 90% acc with min assist.  SLP Short Term Goal 6 - Progress (Week 1): Met    New Short Term Goals: Week 2: SLP Short Term Goal 1 (Week 2): Patient will consume current diet with minimal overt s/s of aspiration and Mod I for use of swallowing compensatory strategies.  SLP Short Term Goal 2 (Week 2): Patient will recall swallowing compenstory strategies  with Mod I.  SLP Short Term Goal 3 (Week 2): Patient will recall new, daily information with supervision verbal cues.  SLP Short Term Goal 4 (Week 2): Patient will demonstrate functional problem solving with mildly complex tasks with supervision verbal cues.  SLP Short Term Goal 5 (Week 2): Patient will utilize speech intelligibility strategies (increased vocal intensity) at the sentence level with supervision verbal cues to achieve 90% intelligibility.   Weekly Progress Updates: Patient has made excellent gains and has met 6 of 6 STG's this reporting period. Currently, patient is consuming regular textures with thin liquids and requires intermittent supervision verbal cues for use of swallowing compensatory strategies. Patient also requires Min A verbal cues to to complete functional and familiar tasks safely in regards to problem solving and memory. Patient and family education is ongoing. Patient would benefit from continued skilled SLP intervention to maximize his swallowing and cognitive function prior to discharge home.      Intensity: Minumum of 1-2 x/day, 30 to 90 minutes Frequency: 3 to 5 out of 7 days Duration/Length of Stay: 6/14 Treatment/Interventions: English as a second language teacher;Dysphagia/aspiration precaution training;Patient/family education;Internal/external aids;Cognitive remediation/compensation;Speech/Language facilitation;Therapeutic Activities;Environmental controls;Functional tasks   Daily Session  Skilled Therapeutic Interventions:  Skilled treatment session focused on dysphagia goals. SLP facilitated session by providing Mod A verbal cues for recall of his swallowing compensatory strategies and supervision verbal cues for use of swallowing compensatory strategies with lunch meal of regular textures with thin liquids. Patient demonstrated an intermittent wet vocal quality that required Min  A verbal cues to self-monitor and correct. Recommend patient continue current diet. Patient left  upright in wheelchair with all needs within reach. Continue with current plan of care.     Function:   Eating Eating   Modified Consistency Diet: No Eating Assist Level: More than reasonable amount of time;Supervision or verbal cues           Cognition Comprehension Comprehension assist level: Understands complex 90% of the time/cues 10% of the time  Expression   Expression assist level: Expresses basic 90% of the time/requires cueing < 10% of the time.  Social Interaction Social Interaction assist level: Interacts appropriately 90% of the time - Needs monitoring or encouragement for participation or interaction.  Problem Solving Problem solving assist level: Solves basic 90% of the time/requires cueing < 10% of the time  Memory Memory assist level: Recognizes or recalls 90% of the time/requires cueing < 10% of the time   Pain No/Denies Pain  Therapy/Group: Individual Therapy  Ginia Rudell 01/01/2017, 3:51 PM

## 2017-01-02 ENCOUNTER — Inpatient Hospital Stay (HOSPITAL_COMMUNITY): Payer: BLUE CROSS/BLUE SHIELD | Admitting: Speech Pathology

## 2017-01-02 ENCOUNTER — Encounter (HOSPITAL_COMMUNITY): Payer: Self-pay

## 2017-01-02 ENCOUNTER — Inpatient Hospital Stay (HOSPITAL_COMMUNITY): Payer: BLUE CROSS/BLUE SHIELD | Admitting: Occupational Therapy

## 2017-01-02 ENCOUNTER — Inpatient Hospital Stay (HOSPITAL_COMMUNITY): Payer: BLUE CROSS/BLUE SHIELD | Admitting: Physical Therapy

## 2017-01-02 NOTE — Progress Notes (Signed)
Occupational Therapy Session Note  Patient Details  Name: Bryan [name] MRN: [id number] Date of Birth: [birth year]  Today's Date: 01/02/2017 OT Individual Time: 1005-1100 OT Individual Time Calculation (min): 55 min    Short Term Goals: Week 1:  OT Short Term Goal 1 (Week 1): Pt will sit to stand with supervision in prep for clohting managment OT Short Term Goal 2 (Week 1): Pt will don pants with MIN A. OT Short Term Goal 3 (Week 1): Pt will don footwear with MIN A and AE prn OT Short Term Goal 4 (Week 1): Pt will toilet with MAX A OT Short Term Goal 5 (Week 1): Pt will transfer to [facility] with touching A    Skilled Therapeutic Interventions/Progress Updates:    Tx focus on balance, activity tolerance, and functional mobility during functional tasks.   Pt greeted in w/c, agreeable to tx. 02 sats 97% on RA. Had him wash hands and then don latex gloves at door exit. Pt ambulated (without device) and close supervision to dayroom. Had him wash tables/counters/chairs. Pt dynamically standing with unilateral support, no rest breaks required. Afterwards he ambulated to therapy apartment. He engaged in bedmaking (with close supervision), carrying comforter across room, donning fitted sheet, and stooping to retrieve linen from floor (with UE support on dresser). Pt ambulated back to room. Toilet transfer/toileting completed with supervision. Pt left in w/c at time of departure (02 sats 99% on RA) with all needs within reach.  Therapy Documentation Precautions:  Precautions Precautions: Fall Precaution Comments: trach, monitor HR and O2 sats Restrictions Weight Bearing Restrictions: No General:   Vital Signs: Therapy Vitals Pulse Rate: 100 Resp: 18 Patient Position (if appropriate): Sitting Oxygen Therapy SpO2: 98 % O2 Device: Not Delivered Pain: No c/o pain during tx    ADL:    See Function Navigator for Current Functional Status.   Therapy/Group: Individual Therapy  Amman Bartel A  Odilon Cass 01/02/2017, 12:24 PM

## 2017-01-02 NOTE — Progress Notes (Signed)
Physical Therapy Session Note  Patient Details  Name: Bryan [name] MRN: [id number] Date of Birth: [birth year]  Today's Date: 01/02/2017 PT Individual Time: 4034-7425 and 9563-8756 PT Individual Time Calculation (min): 56 min and 60 min  Short Term Goals: Week 1:  PT Short Term Goal 1 (Week 1): Pt will negotiate 4 steps for strengthening purposes. PT Short Term Goal 1 - Progress (Week 1): Met PT Short Term Goal 2 (Week 1): Pt will tolerate standing x 5 minutes to increase activity tolerance. PT Short Term Goal 2 - Progress (Week 1): Met  Skilled Therapeutic Interventions/Progress Updates:  Treatment 1: Pt received in room & agreeable to tx, denying c/o pain. Pt donned pants, shirt, and shoes with set up assist to obtain items. Pt requires cuing to thread pants on legs while in seated position to reduce risk of falling. In gym pt completed Berg Balance Test & scored [phone]; educated pt on interpretation of score & current fall risk. Patient demonstrates increased fall risk as noted by score of 48/56 on Berg Balance Scale.  (<36= high risk for falls, close to 100%; 37-45 significant >80%; 46-51 moderate >50%; 52-55 lower >25%). Pt performed BUE bicep curls with 10# weights with max cuing for proper technique to focus on concentric and eccentric control. Pt engaged in dual task, requiring him to ambulate while tossing ball back & forth with therapist and engaging in cognitive task. Pt requires occasional cuing for cognitive task and supervision for balance during activity. At end of session pt left sitting in w/c in room with all needs within reach.    Treatment 2: Pt received in room & agreeable to tx. Pt without c/o pain. Pt doffed wet pants and donned clean shorts with distant supervision for standing balance. Pt able to don shoes in standing with distant supervision also. Pt ambulated throughout unit and unit<>outside without AD and supervision. Pt frequently leans on objects (counter, wall) for  support but denies fatigue. Pt able to negotiate uneven surface (brick) and 4 steps with single rail outside with supervision. Pt able to path find outside>unit without assistance. Pt engaged in zoom ball while standing for 3 minutes with task focusing on increasing activity tolerance. Pt engaged in playing Scattegories requiring mod cuing for correct game play and following instructions. At end of session pt left sitting in w/c in room with all needs within reach.     Therapy Documentation Precautions:  Precautions Precautions: Fall Precaution Comments: trach, monitor HR and O2 sats Restrictions Weight Bearing Restrictions: No   Balance: Balance Balance Assessed: Yes Standardized Balance Assessment Standardized Balance Assessment: Berg Balance Test Berg Balance Test Sit to Stand: Able to stand without using hands and stabilize independently Standing Unsupported: Able to stand safely 2 minutes Sitting with Back Unsupported but Feet Supported on Floor or Stool: Able to sit safely and securely 2 minutes Stand to Sit: Sits safely with minimal use of hands Transfers: Able to transfer safely, minor use of hands Standing Unsupported with Eyes Closed: Able to stand 10 seconds safely Standing Ubsupported with Feet Together: Able to place feet together independently and stand for 1 minute with supervision From Standing, Reach Forward with Outstretched Arm: Can reach confidently >25 cm (10") From Standing Position, Pick up Object from Floor: Able to pick up shoe, needs supervision From Standing Position, Turn to Look Behind Over each Shoulder: Looks behind from both sides and weight shifts well Turn 360 Degrees: Able to turn 360 degrees safely in 4 seconds or less  Standing Unsupported, Alternately Place Feet on Step/Stool: Able to complete 4 steps without aid or supervision Standing Unsupported, One Foot in Front: Able to place foot tandem independently and hold 30 seconds Standing on One Leg:  Unable to try or needs assist to prevent fall Total Score: 48   See Function Navigator for Current Functional Status.   Therapy/Group: [place] 01/02/2017, 6:13 PM

## 2017-01-02 NOTE — Progress Notes (Signed)
[facility]ICINE & REHABILITATION     PROGRESS NOTE    Subjective/Complaints: Up in chair. Working with SLP. PMV on. Plug came out of trach at some ponit overnight  ROS: pt denies nausea, vomiting, diarrhea, cough, shortness of breath or chest pain   Objective: Vital Signs: Blood pressure 118/82, pulse 93, temperature 98 F (36.7 C), temperature source Oral, resp. rate 18, SpO2 98 %. No results found. No results for input(s): WBC, HGB, HCT, PLT in the last 72 hours. No results for input(s): NA, K, CL, GLUCOSE, BUN, CREATININE, CALCIUM in the last 72 hours.  Invalid input(s): CO CBG (last 3)  No results for input(s): GLUCAP in the last 72 hours.  Wt Readings from Last 3 Encounters:  12/25/16 (!) 189.1 kg (417 lb)  02/26/15 (!) 198.7 kg (438 lb)  02/19/15 (!) 198.7 kg (438 lb)    Physical Exam:  Constitutional:  obese HENT:  Head: Normocephalic.  Swelling decreased along left scalp/eye Eyes: EOMare normal.  Left crani scar dry with open area healing by secondary intention Neck:  Trach in place, #6 XLT--cap in place currently.   Good phonation Cardiovascular:  RRR Respiratory: clear with normal effort GI: Soft. Bowel sounds are normal. He exhibits no distension.  Musculoskeletal: He exhibits no edema.  Skin.bleeding, opening of scalp incision Neurological: oriented x 3. Improving insight and awareness. Very alert.  Moves all 4 limbs---strength 6/5 UE's and 4 HF, 4KE and 4 ADF/PF. sensespain in all 4.  Psych :pleasant and cooperative  Assessment/Plan: 1. Functional deficits secondary to left p-com aneurysm which require 3+ hours per day of interdisciplinary therapy in a comprehensive inpatient rehab setting. Physiatrist is providing close team supervision and 24 hour management of active medical problems listed below. Physiatrist and rehab team continue to assess barriers to discharge/monitor patient progress toward functional and medical  goals.  Function:  Bathing Bathing position   Position: Shower  Bathing parts Body parts bathed by patient: Right arm, Left arm, Chest, Abdomen, Front perineal area, Right upper leg, Left upper leg, Right lower leg, Left lower leg Body parts bathed by helper: Buttocks, Back  Bathing assist Assist Level: Touching or steadying assistance(Pt > 75%)      Upper Body Dressing/Undressing Upper body dressing   What is the patient wearing?: Hospital gown                Upper body assist Assist Level: Supervision or verbal cues, Set up   Set up : To obtain clothing/put away  Lower Body Dressing/Undressing Lower body dressing   What is the patient wearing?: Non-skid slipper socks, Hospital Gown Underwear - Performed by patient: Thread/unthread right underwear leg, Thread/unthread left underwear leg, Pull underwear up/down   Pants- Performed by patient: Thread/unthread right pants leg, Thread/unthread left pants leg, Pull pants up/down   Non-skid slipper socks- Performed by patient: Don/doff right sock, Don/doff left sock Non-skid slipper socks- Performed by helper: Don/doff right sock, Don/doff left sock Socks - Performed by patient: Don/doff right sock Socks - Performed by helper: Don/doff left sock              Lower body assist Assist for lower body dressing: Assistive device, Set up, Supervision or verbal cues Assistive Device Comment: sock aid    Toileting Toileting   Toileting steps completed by patient: Adjust clothing prior to toileting Toileting steps completed by helper: Performs perineal hygiene, Adjust clothing after toileting Toileting Assistive Devices: Grab bar or rail  Toileting assist Assist level:  (  max A)   Transfers Chair/bed transfer   Chair/bed transfer method: Ambulatory Chair/bed transfer assist level: Touching or steadying assistance (Pt > 75%) Chair/bed transfer assistive device: Patent attorneyWalker     Locomotion Ambulation     Max distance:  150' Assist level: Supervision or Careers adviserverbal cues   Wheelchair Wheelchair activity did not occur: Refused Type: Manual Max wheelchair distance: 150 ft (BLE) Assist Level: Supervision or verbal cues  Cognition Comprehension Comprehension assist level: Understands complex 90% of the time/cues 10% of the time  Expression Expression assist level: Expresses basic 90% of the time/requires cueing < 10% of the time.  Social Interaction Social Interaction assist level: Interacts appropriately 90% of the time - Needs monitoring or encouragement for participation or interaction.  Problem Solving Problem solving assist level: Solves basic 90% of the time/requires cueing < 10% of the time  Memory Memory assist level: Recognizes or recalls 75 - 89% of the time/requires cueing 10 - 24% of the time  Medical Problem List and Plan: 1. 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 DVT Prophylaxis/Anticoagulation: Left popliteal vein as well as left posterior tibial vein DVT. Status post IVC filter 12/11/2016 per interventional radiology 3. Pain Management: Tylenol as needed. Denies any pain or headache at present 4. Mood: Provide emotional support 5. Neuropsych: This patient is intermittentlycapable of making decisions on hisown behalf.  -pt is much more alert 6. Skin/Wound Care: Routine skin checks  -local care to scalp incision 7. 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 Speech therapy follow-up PMV. Presently with a #8 XLTin place -phonating well.   -downsized to #6 XLT shiley ---capping trials this weekend---decannulate Monday if all goes well 9.Dysphagia. Upgraded to regular and thins!  10.Hypertension. Monitor with increased  mobility 11.Morbid obesity. BMI 56.35 kg Dietary follow-up 12.MRSA PCR screen positive. Contact precautions 13.UTI.Empiric Bactrim, UCX multispecies--re-culture with multispecies--bactrim stopped     LOS (Days) 7 A FACE TO FACE EVALUATION WAS PERFORMED  Ranelle OysterSWARTZ,Jahrell Hamor T, MD 01/02/2017 8:49 AM

## 2017-01-02 NOTE — Progress Notes (Signed)
Speech Language Pathology Daily Session Note  Patient Details  Name: Bryan [name] MRN: [id number] Date of Birth: 11-Feb-1981  Today's Date: 01/02/2017 SLP Individual Time: 0735-0800 SLP Individual Time Calculation (min): 25 min  Short Term Goals: Week 2: SLP Short Term Goal 1 (Week 2): Patient will consume current diet with minimal overt s/s of aspiration and Mod I for use of swallowing compensatory strategies.  SLP Short Term Goal 2 (Week 2): Patient will recall swallowing compenstory strategies with Mod I.  SLP Short Term Goal 3 (Week 2): Patient will recall new, daily information with supervision verbal cues.  SLP Short Term Goal 4 (Week 2): Patient will demonstrate functional problem solving with mildly complex tasks with supervision verbal cues.  SLP Short Term Goal 5 (Week 2): Patient will utilize speech intelligibility strategies (increased vocal intensity) at the sentence level with supervision verbal cues to achieve 90% intelligibility.   Skilled Therapeutic Interventions: Skilled treatment session focused on speech and dysphagia goals. Upon arrival, patient was awake with PMSV in place. Patient was ~90% intelligible at the sentence level due to hoarse vocal quality and low vocal intensity and required supervision verbal for use of an increased vocal intensity. Patient also consumed breakfast meal of regular textures with thin liquids with wet vocal quality X 1 that patient required supervision verbal cues to self-monitor and correct. Recommend patient continue current diet. Patient left upright in wheelchair with all needs within reach. Continue with current plan of care.      Function:  Eating Eating   Modified Consistency Diet: No Eating Assist Level: More than reasonable amount of time;Supervision or verbal cues           Cognition Comprehension Comprehension assist level: Understands complex 90% of the time/cues 10% of the time  Expression   Expression assist level:  Expresses basic 90% of the time/requires cueing < 10% of the time.  Social Interaction Social Interaction assist level: Interacts appropriately 90% of the time - Needs monitoring or encouragement for participation or interaction.  Problem Solving Problem solving assist level: Solves basic 90% of the time/requires cueing < 10% of the time  Memory Memory assist level: Recognizes or recalls 75 - 89% of the time/requires cueing 10 - 24% of the time    Pain No/Denies Pain  Therapy/Group: Individual Therapy  [name] 01/02/2017, 3:13 PM

## 2017-01-03 ENCOUNTER — Inpatient Hospital Stay (HOSPITAL_COMMUNITY): Payer: BLUE CROSS/BLUE SHIELD | Admitting: Occupational Therapy

## 2017-01-03 DIAGNOSIS — R269 Unspecified abnormalities of gait and mobility: Secondary | ICD-10-CM

## 2017-01-03 NOTE — Progress Notes (Addendum)
[facility]IC[facility] RE[facility]ABILITATION     PROGRESS NOTE    Subjective/Complaints: Pt seen sitting in his chair this AM watching TV. He slept well overnight.  ROS: Denies CP, SOB, N/V/D.  Objective: Vital Signs: Blood pressure (!) 145/81, pulse 93, temperature 98.8 F (37.1 C), temperature source Oral, resp. rate 20, SpO2 95 %. No results found. No results for input(s): WBC, HGB, HCT, PLT in the last 72 hours. No results for input(s): NA, K, CL, GLUCOSE, BUN, CREATININE, CALCIUM in the last 72 hours.  Invalid input(s): CO CBG (last 3)  No results for input(s): GLUCAP in the last 72 hours.  Wt Readings from Last 3 Encounters:  12/25/16 (!) 189.1 kg (417 lb)  02/26/15 (!) 198.7 kg (438 lb)  02/19/15 (!) 198.7 kg (438 lb)    Physical Exam:  Constitutional: NAD. Obese HENT: Normocephalic. Left crani site healing  Eyes: EOMI are normal. No discharge.  Neck: Trach in place, #6 XLT--capped.    Cardiovascular:  RRR. No JVD. Respiratory: clear with normal effort GI: Soft. Bowel sounds are normal. He exhibits no distension.  Musculoskeletal: He exhibits no edema.  Neurological: alert and oriented x 3.  Good insight and awareness. Motor: 5/5 UE's and 4+/5 B/l LE. Skin: Warm and dry.  See above Psych :pleasant and cooperative  Assessment/Plan: 1. Functional deficits secondary to left p-com aneurysm which require 3+ hours per day of interdisciplinary therapy in a comprehensive inpatient rehab setting. Physiatrist is providing close team supervision and 24 hour management of active medical problems listed below. Physiatrist and rehab team continue to assess barriers to discharge/monitor patient progress toward functional and medical goals.  Function:  Bathing Bathing position   Position: Shower  Bathing parts Body parts bathed by patient: Right arm, Left arm, Chest, Abdomen, Front perineal area, Right upper leg, Left upper leg, Right lower leg, Left lower leg Body  parts bathed by helper: Buttocks, Back  Bathing assist Assist Level: Touching or steadying assistance(Pt > 75%)      Upper Body Dressing/Undressing Upper body dressing   What is the patient wearing?: Hospital gown                Upper body assist Assist Level: Supervision or verbal cues, Set up   Set up : To obtain clothing/put away  Lower Body Dressing/Undressing Lower body dressing   What is the patient wearing?: Non-skid slipper socks, Hospital Gown Underwear - Performed by patient: Thread/unthread right underwear leg, Thread/unthread left underwear leg, Pull underwear up/down   Pants- Performed by patient: Thread/unthread right pants leg, Thread/unthread left pants leg, Pull pants up/down   Non-skid slipper socks- Performed by patient: Don/doff right sock, Don/doff left sock Non-skid slipper socks- Performed by helper: Don/doff right sock, Don/doff left sock Socks - Performed by patient: Don/doff right sock Socks - Performed by helper: Don/doff left sock              Lower body assist Assist for lower body dressing: Assistive device, Set up, Supervision or verbal cues Assistive Device Comment: sock aid    Toileting Toileting   Toileting steps completed by patient: Adjust clothing prior to toileting, Performs perineal hygiene, Adjust clothing after toileting Toileting steps completed by helper: Performs perineal hygiene, Adjust clothing after toileting Toileting Assistive Devices: Grab bar or rail  Toileting assist Assist level:  (max A)   Transfers Chair/bed transfer   Chair/bed transfer method: Ambulatory Chair/bed transfer assist level: Touching or steadying assistance (Pt > 75%) Chair/bed transfer  assistive device: Patent attorneyWalker     Locomotion Ambulation     Max distance: >200 ft Assist level: Supervision or Careers adviserverbal cues   Wheelchair Wheelchair activity did not occur: Refused Type: Manual Max wheelchair distance: 150 ft (BLE) Assist Level: Supervision or  verbal cues  Cognition Comprehension Comprehension assist level: Understands complex 90% of the time/cues 10% of the time  Expression Expression assist level: Expresses complex 90% of the time/cues < 10% of the time  Social Interaction Social Interaction assist level: Interacts appropriately 90% of the time - Needs monitoring or encouragement for participation or interaction.  Problem Solving Problem solving assist level: Solves basic 90% of the time/requires cueing < 10% of the time  Memory Memory assist level: Recognizes or recalls 75 - 89% of the time/requires cueing 10 - 24% of the time  Medical Problem List and Plan: 1. 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 DVT Prophylaxis/Anticoagulation: Left popliteal vein as well as left posterior tibial vein DVT. Status post IVC filter 12/11/2016 per interventional radiology 3. Pain Management: Tylenol as needed. Denies any pain  4. Mood: Provide emotional support 5. Neuropsych: This patient is intermittentlycapable of making decisions on hisown behalf. 6. Skin/Wound Care: Routine skin checks  -local care to scalp incision 7. 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 Speech therapy follow-up PMV.  phonating well.   downsized to #6 XLT shiley, capping trials, decannulate Monday if tolerated 9.Dysphagia. Upgraded to regular and thins 10.Hypertension. Monitor with increased mobility  Labile, but overall controlled 11.Morbid obesity. BMI 56.35 kg Dietary follow-up 12.MRSA PCR screen positive. Contact precautions 13.UTI.Empiric Bactrim, UCX multispecies--re-culture with multispecies--bactrim stopped  LOS (Days) 8 A FACE TO FACE EVALUATION WAS PERFORMED  Krislynn Gronau Karis JubaAnil Yarethzi Branan, MD 01/03/2017 11:05 AM

## 2017-01-03 NOTE — Progress Notes (Signed)
01/03/17 1730 nursing Patient refuses wound care claims it is better to let it open to air.

## 2017-01-03 NOTE — Progress Notes (Signed)
Occupational Therapy Weekly Progress Note  Patient Details  Name: Bryan [name] MRN: [id number] Date of Birth: 1980-12-20   Today's Date: 01/03/2017 OT Individual Time: 9532-0233 OT Individual Time Calculation (min): 61 min   Beginning of progress report period: 12/27/16 End of progress report period: 01/03/17  Patient has met 5 of 5 short term goals.    Pt has made excellent progress during this report period. He has gone from completing BADLs w/c level at sink with Min-Max A, to now completing BADLs in shower at [place] I level. Pt has improved independence with functional transfers as well, going from completing with Mod A, to now completing without device and supervision. Pt progressing towards LTG achievement for d/c this week.   Patient continues to demonstrate the following deficits: decreased standing balance and therefore will continue to benefit from skilled OT intervention to enhance overall performance with BADL and iADL.  Patient progressing toward long term goals..  Continue plan of care.  OT Short Term Goals Week 2:  OT Short Term Goal 1 (Week 2): STGs=LTGs due to ELOS  Skilled Therapeutic Interventions/Progress Updates:    Tx focus on dynamic balance, endurance, and adaptive bathing/dressing skills.   Pt greeted supine in bed, agreeable to shower (he reported doing this in his room on his own this weekend). Pt ambulated around room to gather ADL items and provided his own setup in bathroom. Pt donned trach collar, verbalized understanding with bathing below shoulder level for trach safety. Pt completed bathing at Mod I level, sitting on bariatric BSC as needed. Pt then donned 2 hospital gowns and gripper socks/shoes with AE as needed. Pt completed grooming tasks/oral care standing at sink without AD at Mod I level. Afterwards had him engage in vacuuming dayroom. Pt stooping to plug in device, moving furniture around without assist. Pt lifting vacuum onto table to wrap  chord up. Pt lifting chairs in speech office to place back in the room after vacuuming it. No LOBs. Pt then ambulated back to room and was left with all needs within reach. 02 sats 100% at end of tx.   Therapy Documentation Precautions:  Precautions Precautions: Fall Precaution Comments: trach, monitor HR and O2 sats Restrictions Weight Bearing Restrictions: No   Vital Signs: Oxygen Therapy SpO2: 97 % O2 Device: Not Delivered FiO2 (%): 21 % Pain: Pain Assessment Pain Assessment: No/denies pain ADL:  :    See Function Navigator for Current Functional Status.   Therapy/Group: Individual Therapy  Sheilla Maris A Rosene Pilling 01/03/2017, 7:35 PM

## 2017-01-04 ENCOUNTER — Inpatient Hospital Stay (HOSPITAL_COMMUNITY): Payer: BLUE CROSS/BLUE SHIELD | Admitting: Occupational Therapy

## 2017-01-04 NOTE — Progress Notes (Signed)
01/04/17 1650 nursing Patient refuses wound care.

## 2017-01-04 NOTE — Progress Notes (Signed)
[facility]IC[facility] RE[facility]ILIT[facility]     PROGRESS NOTE    Subjective/Complaints: Pt seen sitting up in his chair this AM.  He slept well overnight.  Per pt, he was told by OT that would be able to discharge sooner than anticipated.   ROS: Denies CP, SOB, N/V/D.  Objective: Vital Signs: Blood pressure (!) 144/91, pulse 88, temperature 98.3 F (36.8 C), temperature source Oral, resp. rate 20, SpO2 99 %. No results found. No results for input(s): WBC, HGB, HCT, PLT in the last 72 hours. No results for input(s): NA, K, CL, GLUCOSE, BUN, CREATININE, CALCIUM in the last 72 hours.  Invalid input(s): CO CBG (last 3)  No results for input(s): GLUCAP in the last 72 hours.  Wt Readings from Last 3 Encounters:  12/25/16 (!) 189.1 kg (417 lb)  02/26/15 (!) 198.7 kg (438 lb)  02/19/15 (!) 198.7 kg (438 lb)    Physical Exam:  Constitutional: NAD. Obese HENT: Normocephalic. Left crani site healing  Eyes: EOMI are normal. No discharge.  Neck: Trach in place, #6 XLT--capped.    Cardiovascular:  RRR. No JVD. Respiratory: clear with normal effort GI: Soft. Bowel sounds are normal.   Musculoskeletal: He exhibits no edema.  Neurological: alert and oriented.  Good insight and awareness. Motor: 5/5 UE's and 4+/5 B/l LE. Skin: Warm and dry.  See above Psych: pleasant and cooperative  Assessment/Plan: 1. Functional deficits secondary to left p-com aneurysm which require 3+ hours per day of interdisciplinary therapy in a comprehensive inpatient rehab setting. Physiatrist is providing close team supervision and 24 hour management of active medical problems listed below. Physiatrist and rehab team continue to assess barriers to discharge/monitor patient progress toward functional and medical goals.  Function:  Bathing Bathing position   Position: Shower  Bathing parts Body parts bathed by patient: Right arm, Left arm, Chest, Abdomen, Front perineal area, Right upper leg, Left upper  leg, Right lower leg, Left lower leg, Buttocks Body parts bathed by helper: Buttocks, Back  Bathing assist Assist Level: More than reasonable time      Upper Body Dressing/Undressing Upper body dressing   What is the patient wearing?: Hospital gown                Upper body assist Assist Level: No help, No cues   Set up : To obtain clothing/put away  Lower Body Dressing/Undressing Lower body dressing   What is the patient wearing?: Non-skid slipper socks, Shoes, Hospital Gown Underwear - Performed by patient: Thread/unthread right underwear leg, Thread/unthread left underwear leg, Pull underwear up/down   Pants- Performed by patient: Thread/unthread right pants leg, Thread/unthread left pants leg, Pull pants up/down   Non-skid slipper socks- Performed by patient: Don/doff right sock, Don/doff left sock Non-skid slipper socks- Performed by helper: Don/doff right sock, Don/doff left sock Socks - Performed by patient: Don/doff right sock Socks - Performed by helper: Don/doff left sock Shoes - Performed by patient: Don/doff right shoe, Don/doff left shoe            Lower body assist Assist for lower body dressing: More than reasonable time Assistive Device Comment:  (sock aide)    Toileting Toileting   Toileting steps completed by patient: Adjust clothing prior to toileting, Performs perineal hygiene, Adjust clothing after toileting Toileting steps completed by helper: Performs perineal hygiene, Adjust clothing after toileting Toileting Assistive Devices: Grab bar or rail  Toileting assist Assist level:  (max A)   Transfers Chair/bed transfer  Chair/bed transfer method: Ambulatory Chair/bed transfer assist level: Touching or steadying assistance (Pt > 75%) Chair/bed transfer assistive device: Patent attorney     Max distance: >200 ft Assist level: Supervision or Careers adviser activity did not occur: Refused Type:  Manual Max wheelchair distance: 150 ft (BLE) Assist Level: Supervision or verbal cues  Cognition Comprehension Comprehension assist level: Understands complex 90% of the time/cues 10% of the time  Expression Expression assist level: Expresses basic 90% of the time/requires cueing < 10% of the time.  Social Interaction Social Interaction assist level: Interacts appropriately 90% of the time - Needs monitoring or encouragement for participation or interaction.  Problem Solving Problem solving assist level: Solves basic 90% of the time/requires cueing < 10% of the time  Memory Memory assist level: Recognizes or recalls 90% of the time/requires cueing < 10% of the time  Medical Problem List and Plan: 1. Motor, cognitive, functional deficitssecondary to ruptured left posterior communicating artery aneurysm status post clipping  Continue CIR therapies 2. DVT Prophylaxis/Anticoagulation: Left popliteal vein as well as left posterior tibial vein DVT. Status post IVC filter 12/11/2016 per interventional radiology 3. Pain Management: Tylenol as needed. Denies any pain  4. Mood: Provide emotional support 5. Neuropsych: This patient is intermittentlycapable of making decisions on hisown behalf. 6. Skin/Wound Care: Routine skin checks  -local care to scalp incision 7. 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 Speech therapy follow-up    Phonating well.   Downsized to #6 XLT shiley, capping trials, tolerating, plan for decannulation tomorrow  9.Dysphagia. Upgraded to regular and thins 10.Hypertension. Monitor with increased mobility  Overall controlled 6/10 11.Morbid obesity. BMI 56.35 kg Dietary follow-up 12.MRSA PCR screen positive. Contact precautions  LOS (Days) 9 A FACE TO FACE EVALUATION WAS PERFORMED  Darlen Gledhill Karis Juba, MD 01/04/2017 1:37 PM

## 2017-01-04 NOTE — Progress Notes (Signed)
Occupational Therapy Session Note  Patient Details  Name: Bryan KaysDarren D Yearby MRN: [id number] Date of Birth: 12-06-1980  Today's Date: 01/04/2017 OT Individual Time: [id number] OT Individual Time Calculation (min): 43 min    Short Term Goals: Week 2:  OT Short Term Goal 1 (Week 2): STGs=LTGs due to ELOS  Skilled Therapeutic Interventions/Progress Updates:    Tx focus on IADL retraining, functional ambulation without device, and UB strengthening  Pt greeted supine in bed. Pt donning hospital gown and ambulating into bathroom to void at Mod I level. Pt washing hands at sink and donning clean latex gloves to leave room for session. Pt ambulated to therapy kitchen, engaged in simulated meal prep tasks. 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 Simulated these conditions in gym and pt able to lift 40lbs and carry it to low surface 6 feet away. UB strengthening training continued with lateral pull down machine and 40#s, 20 reps, 2 sets. Pt then ambulated back to room and was left with all needs within reach.   Pt reports having no DME needs for d/c, reporting his mother has built in shower bench (that will accommodate his weight) and that accessing toilet with be no issue. 02 sats 99-100% on RA with trach capped.   Therapy Documentation Precautions:  Precautions Precautions: Fall Precaution Comments: trach, monitor HR and O2 sats Restrictions Weight Bearing Restrictions: No   Vital Signs: Therapy Vitals Pulse Rate: 88 Resp: 20 Patient Position (if appropriate): Sitting Oxygen Therapy SpO2: 99 % O2 Device: Not Delivered Pain: No c/o pain during tx    ADL:      See Function Navigator for Current Functional Status.   Therapy/Group: Individual Therapy  [place] Dube A  Kadon Andrus 01/04/2017, 12:40 PM

## 2017-01-05 ENCOUNTER — Inpatient Hospital Stay (HOSPITAL_COMMUNITY): Payer: BLUE CROSS/BLUE SHIELD | Admitting: Occupational Therapy

## 2017-01-05 ENCOUNTER — Inpatient Hospital Stay (HOSPITAL_COMMUNITY): Payer: BLUE CROSS/BLUE SHIELD | Admitting: Physical Therapy

## 2017-01-05 ENCOUNTER — Encounter (HOSPITAL_COMMUNITY): Payer: Self-pay

## 2017-01-05 LAB — BASIC METABOLIC PANEL
Anion gap: 8 (ref 5–15)
BUN: 7 mg/dL (ref 6–20)
CALCIUM: 9.2 mg/dL (ref 8.9–10.3)
CHLORIDE: 105 mmol/L (ref 101–111)
CO2: 25 mmol/L (ref 22–32)
Creatinine, Ser: 0.99 mg/dL (ref 0.61–1.24)
GFR calc non Af Amer: >60 mL/min (ref >60)
Glucose, Bld: 104 mg/dL — ABNORMAL HIGH (ref 65–99)
POTASSIUM: 3.8 mmol/L (ref 3.5–5.1)
Sodium: 138 mmol/L (ref 135–145)

## 2017-01-05 LAB — CBC
HEMATOCRIT: 33.5 % — AB (ref 39.0–52.0)
HEMOGLOBIN: 10 g/dL — AB (ref 13.0–17.0)
MCH: 26.5 pg (ref 26.0–34.0)
MCHC: 29.9 g/dL — AB (ref 30.0–36.0)
MCV: 88.9 fL (ref 78.0–100.0)
Platelets: 306 10*3/uL (ref 150–400)
RBC: 3.77 MIL/uL — AB (ref 4.22–5.81)
RDW: 15.4 % (ref 11.5–15.5)
WBC: 4.9 10*3/uL (ref 4.0–10.5)

## 2017-01-05 NOTE — Plan of Care (Signed)
Problem: RH Bed to Chair Transfers Goal: LTG Patient will perform bed/chair transfers w/assist (PT) LTG: Patient will perform bed/chair transfers with assistance, with/without cues (PT).  Upgrade 2/2 progress  Problem: RH Ambulation Goal: LTG Patient will ambulate in controlled environment (PT) LTG: Patient will ambulate in a controlled environment, # of feet with assistance (PT).  150 ft with LRAD; upgrade 2/2 progress Goal: LTG Patient will ambulate in home environment (PT) LTG: Patient will ambulate in home environment, # of feet with assistance (PT).  100 ft with LRAD; upgrade 2/2 progress

## 2017-01-05 NOTE — Progress Notes (Signed)
Patient scratched incision area on wound, small amount of sanguinous drainage present. Refused wound care. Advised patient to keep hands off area to decrease risk of infection.

## 2017-01-05 NOTE — Progress Notes (Signed)
[facility] MEDICINE & RE[facility]ABILITATION     PROGRESS NOTE    Subjective/Complaints: Pt doing well. No complaints. Breathing well. Had trach plugged all weekend. Good appetite  ROS: pt denies nausea, vomiting, diarrhea, cough, shortness of breath or chest pain   Objective: Vital Signs: Blood pressure 140/88, pulse 92, temperature 98.1 F (36.7 C), temperature source Oral, resp. rate 16, SpO2 99 %. No results found.  Recent Labs  01/05/17 0607  WBC 4.9  HGB 10.0*  HCT 33.5*  PLT 306    Recent Labs  01/05/17 0607  NA 138  K 3.8  CL 105  GLUCOSE 104*  BUN 7  CREATININE 0.99  CALCIUM 9.2   CBG (last 3)  No results for input(s): GLUCAP in the last 72 hours.  Wt Readings from Last 3 Encounters:  12/25/16 (!) 189.1 kg (417 lb)  02/26/15 (!) 198.7 kg (438 lb)  02/19/15 (!) 198.7 kg (438 lb)    Physical Exam:  Constitutional: NAD. Obese HENT: Normocephalic. Left crani site healing  Eyes: EOMI are normal. No discharge.  Neck: Trach in place, #6 XLT--capped.   Good phonation Cardiovascular:  RRR without murmur. No JVD Respiratory: CTA Bilaterally without wheezes or rales. Normal effort GI: Soft. Bowel sounds are normal.   Musculoskeletal: He exhibits no edema.  Neurological: alert and oriented.  Good insight and awareness. Motor: 5/5 UE's and 4+/5 B/l LE. Skin: Warm and dry.  See above Psych: cooperative  Assessment/Plan: 1. Functional deficits secondary to left p-com aneurysm which require 3+ hours per day of interdisciplinary therapy in a comprehensive inpatient rehab setting. Physiatrist is providing close team supervision and 24 hour management of active medical problems listed below. Physiatrist and rehab team continue to assess barriers to discharge/monitor patient progress toward functional and medical goals.  Function:  Bathing Bathing position   Position: Shower  Bathing parts Body parts bathed by patient: Right arm, Left arm, Chest,  Abdomen, Front perineal area, Right upper leg, Left upper leg, Right lower leg, Left lower leg, Buttocks Body parts bathed by helper: Buttocks, Back  Bathing assist Assist Level: More than reasonable time      Upper Body Dressing/Undressing Upper body dressing   What is the patient wearing?: Hospital gown                Upper body assist Assist Level: No help, No cues   Set up : To obtain clothing/put away  Lower Body Dressing/Undressing Lower body dressing   What is the patient wearing?: Non-skid slipper socks, Shoes, Hospital Gown Underwear - Performed by patient: Thread/unthread right underwear leg, Thread/unthread left underwear leg, Pull underwear up/down   Pants- Performed by patient: Thread/unthread right pants leg, Thread/unthread left pants leg, Pull pants up/down   Non-skid slipper socks- Performed by patient: Don/doff right sock, Don/doff left sock Non-skid slipper socks- Performed by helper: Don/doff right sock, Don/doff left sock Socks - Performed by patient: Don/doff right sock Socks - Performed by helper: Don/doff left sock Shoes - Performed by patient: Don/doff right shoe, Don/doff left shoe            Lower body assist Assist for lower body dressing: More than reasonable time Assistive Device Comment:  (sock aide)    Toileting Toileting   Toileting steps completed by patient: Adjust clothing prior to toileting, Performs perineal hygiene, Adjust clothing after toileting Toileting steps completed by helper: Performs perineal hygiene, Adjust clothing after toileting Toileting Assistive Devices: Grab bar or rail  Toileting assist Assist  level: More than reasonable time   Transfers Chair/bed transfer   Chair/bed transfer method: Ambulatory Chair/bed transfer assist level: No Help, no cues, assistive device, takes more than a reasonable amount of time Chair/bed transfer assistive device: Walker, Bedrails     Locomotion Ambulation     Max distance:  >200 ft Assist level: Supervision or verbal cues   Wheelchair Wheelchair activity did not occur: Refused Type: Manual Max wheelchair distance: 150 ft (BLE) Assist Level: Supervision or verbal cues  Cognition Comprehension Comprehension assist level: Understands complex 90% of the time/cues 10% of the time  Expression Expression assist level: Expresses basic 90% of the time/requires cueing < 10% of the time.  Social Interaction Social Interaction assist level: Interacts appropriately 90% of the time - Needs monitoring or encouragement for participation or interaction.  Problem Solving Problem solving assist level: Solves basic 90% of the time/requires cueing < 10% of the time  Memory Memory assist level: Recognizes or recalls 90% of the time/requires cueing < 10% of the time  Medical Problem List and Plan: 1. Motor, cognitive, functional deficitssecondary to ruptured left posterior communicating artery aneurysm status post clipping  Continue CIR therapies 2. DVT Prophylaxis/Anticoagulation: Left popliteal vein as well as left posterior tibial vein DVT. Status post IVC filter 12/11/2016 per interventional radiology 3. Pain Management: Tylenol as needed. Denies any pain  4. Mood: Provide emotional support 5. Neuropsych: This patient is intermittentlycapable of making decisions on hisown behalf. 6. Skin/Wound Care: Routine skin checks  -local care to scalp incision 7. 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 Has OSA but saturations all near 100% with trach plugged   Phonating well.   Downsized to #6 XLT shiley, decannulate today 9.Dysphagia. Upgraded to regular and thins 10.Hypertension. Monitor with increased mobility  Overall controlled 6/10 11.Morbid obesity. BMI 56.35 kg Dietary follow-up 12.MRSA PCR screen positive. Contact precautions  LOS (Days) 10 A FACE TO FACE EVALUATION WAS  PERFORMED  Ranelle OysterSWARTZ,Sherril Heyward T, MD 01/05/2017 8:02 AM

## 2017-01-05 NOTE — Progress Notes (Signed)
RT checked on patient and patient is tolerating decannulation well at this time.

## 2017-01-05 NOTE — Progress Notes (Addendum)
Physical Therapy Session Note  Patient Details  Name: Bryan KaysDarren D Moralez MRN: [id number] Date of Birth: 1980/11/15  Today's Date: 01/05/2017 PT Individual Time: 1305-1430  PT Individual Time Calculation (min): 85 min   Short Term Goals: Week 2:  PT Short Term Goal 1 (Week 2): STG = LTG due to estimated d/c date.   Skilled Therapeutic Interventions/Progress Updates:  Pt received in room & agreeable to tx. Session focused on functional mobility, d/c planning & pt education with pt's mother, and cognitive remediation. Pt ambulated throughout room & unit, obtaining and donning a clean gown, performing hand hygiene standing at sink, cleaning items in the gym all with mod I in controlled environment without an AD. Pt engaged in ambulation and dual tasks (tossing ball back & forth while name items with alternating letters of the alphabet) with supervision assist. Pt required assistance to recall correct next letter of alphabet, as well as correct cities & states. Pt engaged in game of [employer] requiring 25% cuing for correctness. Pt's mother present for last portion of session - therapist educated pt & mother on recommendation of not to drive (vehicle or tractor) until cleared by MD and home modifications to reduce fall risk. Pt & mother report pt will d/c home with her where she has 4 steps with B rails to enter home. Pt then negotiated 4 steps in gym with supervision and pt & mother both report comfort with pt discharging home this week. At end of session pt left sitting in w/c in room with mother present to supervise.  Addendum: Pt required cuing to cover stoma with hand when talking.  Therapy Documentation Precautions:  Precautions Precautions: Fall Precaution Comments: trach, monitor HR and O2 sats Restrictions Weight Bearing Restrictions: No  Pain: Denied c/o pain.   See Function Navigator for Current Functional Status.   Therapy/Group: Individual Therapy  Sandi MariscalVictoria M Saryna Kneeland 01/05/2017,  3:28 PM

## 2017-01-05 NOTE — Progress Notes (Signed)
Occupational Therapy Session Note  Patient Details  Name: Bryan [name] MRN: [id number] Date of Birth: 04-25-1981  Today's Date: 01/05/2017 OT Individual Time: [id number] OT Individual Time Calculation (min): 58 min    Short Term Goals: Week 2:  OT Short Term Goal 1 (Week 2): STGs=LTGs due to ELOS  Skilled Therapeutic Interventions/Progress Updates:    Tx session on dynamic balance, endurance, and safety during self care tasks.   Pt greeted in w/c after eating breakfast, agreeable to shower. Pt ambulated in room without device with gripper socks donned and increased time to gather necessary items, donned trach collar prior to shower, and bathed without assist. Pt then gathered clothing items around room, using sock aide for donning gripper socks at EOB. Pt donning hospital gown/pants from dresser. Grooming oral care completed standing at sink.  No unsafe behaviors observed. Per pt, he has been walking to the toilet and showering on his own in the room. Pt then washed his hands and donned latex gloves, ambulated to gym. Pt completing boxing with boxing gloves for dynamic balance and endurance challenges. Pt requiring 3 rest breaks. He then ambulated back to room, checked his own 02 levels with instruction from OT on method for using dynamap/02 monitor: 99% on RA. Pt was left with all needs within reach.   Continue to discuss impending d/c. Notified SW regarding DME and followup needs.   Therapy Documentation Precautions:  Precautions Precautions: Fall Precaution Comments: trach, monitor HR and O2 sats Restrictions Weight Bearing Restrictions: No   Pain: No c/o pain during tx    ADL:      See Function Navigator for Current Functional Status.   Therapy/Group: Individual Therapy  Halley Kincer A Giana Castner 01/05/2017, 12:20 PM

## 2017-01-05 NOTE — Procedures (Signed)
Tracheostomy Change Note  Patient Details:   Name: Kem KaysDarren D Carras DOB: [birth year] MRN: [id number]    Airway Documentation:     Evaluation  O2 sats: stable throughout Complications: No apparent complications Patient did tolerate procedure well. Bilateral Breath Sounds: Clear, Diminished   RT removed patients trach per MD order. Patient tolerated well. RN aware that trach has been removed.    Danella Maiersshley M [facility] 01/05/2017, 11:17 AM

## 2017-01-05 NOTE — Progress Notes (Signed)
Occupational Therapy Session Note  Patient Details  Name: Bryan [name] MRN: [id number] Date of Birth: February 02, 1981  Today's Date: 01/05/2017 OT Individual Time: 1000-1057 OT Individual Time Calculation (min): 57 min    Short Term Goals: Week 1:  OT Short Term Goal 1 (Week 1): Pt will sit to stand with supervision in prep for clohting managment OT Short Term Goal 2 (Week 1): Pt will don pants with MIN A. OT Short Term Goal 3 (Week 1): Pt will don footwear with MIN A and AE prn OT Short Term Goal 4 (Week 1): Pt will toilet with MAX A OT Short Term Goal 5 (Week 1): [facility]  Skilled Therapeutic Interventions/Progress Updates:    Upon entering the room, pt seated in wheelchair with no c/o pain. Pt anxious over having trach removed today. Pt ambulating to sink to wash hands with out assistance. Pt ambulating 500'+ without use of AD at mod I level onto elevator and outside. Pt ambulating on uneven surfaces without issue and pt keeping steady pace with ambulating and forward gaze. Pt taking seated rest break while OT discussed upcoming discharge recommendations and pt progress towards goals. Pt feels that he is ready for discharge. Pt ambulating back to room in same manner while therapist had pt turning head and providing distractions such as conversing with therapist and pt continues to ambulate at mod I level. Pt returning to room and seated in wheelchair with call bell and all needed items within reach upon exiting the room.   Therapy Documentation Precautions:  Precautions Precautions: Fall Precaution Comments: trach, monitor HR and O2 sats Restrictions Weight Bearing Restrictions: No  See Function Navigator for Current Functional Status.   Therapy/Group: Individual Therapy  Alen BleacherBradsher, Jorje Vanatta P 01/05/2017, 11:54 AM

## 2017-01-05 NOTE — Plan of Care (Signed)
Problem: RH Wheelchair Mobility Goal: LTG Patient will propel w/c in controlled environment (PT) LTG: Patient will propel wheelchair in controlled environment, # of feet with assist (PT)  Outcome: Adequate for Discharge D/c goal - pt ambulatory

## 2017-01-06 ENCOUNTER — Inpatient Hospital Stay (HOSPITAL_COMMUNITY): Payer: BLUE CROSS/BLUE SHIELD | Admitting: Speech Pathology

## 2017-01-06 ENCOUNTER — Inpatient Hospital Stay (HOSPITAL_COMMUNITY): Payer: BLUE CROSS/BLUE SHIELD | Admitting: Occupational Therapy

## 2017-01-06 ENCOUNTER — Inpatient Hospital Stay (HOSPITAL_COMMUNITY): Payer: BLUE CROSS/BLUE SHIELD | Admitting: Physical Therapy

## 2017-01-06 ENCOUNTER — Encounter (HOSPITAL_COMMUNITY): Payer: Self-pay

## 2017-01-06 NOTE — Discharge Summary (Signed)
NAMPincus Large:  Bryan [name], Bryan [name]                ACCOUNT NO.:  [id number]  MEDICAL RECORD NO.:  [id number]  LOCATION:  3M10C                        FACILITY:  [facility]  PHYSICIAN:  Ranelle OysterZachary T. Swartz, M.D.DATE OF BIRTH:  08-Jan-1981  DATE OF ADMISSION:  12/26/2016 DATE OF DISCHARGE:  01/07/2017                              DISCHARGE SUMMARY   DISCHARGE DIAGNOSES: 1. Ruptured left posterior communicating artery, status post clipping. 2. 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 3. Pain management. 4. Tracheostomy - decannulated. 5. Dysphagia, resolved. 6. Hypertension. 7. Morbid obesity. 8. MRSA PCR screening positive.  HISTORY OF PRESENT ILLNESS:  This is a 36 year old right-handed male, morbid obesity, on no prescription medications.  Independent prior to admission, living with parents.  Presented on 12/01/2016, with sudden onset of headache.  No recent trauma or fall.  No history of seizures. CT angiogram of the head showed diffuse subarachnoid hemorrhage, over both convex and extending into the basal cisterns.  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  Echocardiogram with ejection fraction of 70%.  Systolic function was vigorous.  Developed worsening hypoxemia, increasing shortness of breath, required intubation. Underwent left craniotomy clipping of posterior communicating artery aneurysm on 12/02/2016, per Dr. Conchita ParisNundkumar.  HOSPITAL COURSE,:  Tracheostomy on 12/15/2016, per Dr. Pollyann Kennedyosen.  Findings of acute DVT left popliteal vein as well as left posterior tibial vein, underwent placement of IVC filter per Interventional Radiology on 12/11/2016.  Tube feeds for nutritional support.  Diet slowly advanced. MRSA PCR screening positive, maintained on contact precautions.  The patient was admitted for comprehensive rehab program.  PAST MEDICAL HISTORY:   See discharge diagnoses.  SOCIAL HISTORY:  Lives with parent.  Independent prior to admission.  FUNCTIONAL STATUS:  Upon admission to rehab services was +2 physical assist stand pivot transfers, +2 physical assist supine to sit, mod-to- max assist activities of daily living.  PHYSICAL EXAMINATION:  VITAL SIGNS:  Blood pressure 108/69, pulse 90, temperature 98, respirations 16. GENERAL:  Alert male, in no acute distress. HEENT:  EOMs intact. NECK:  Supple.  Nontender.  No JVD.  Tracheostomy tube was in place. CARDIAC:  Rate controlled. ABDOMEN:  Obese, soft, nontender. LUNGS:  Decreased breath sounds at the bases, but clear to auscultation.  REHABILITATION HOSPITAL COURSE:  The patient was admitted to inpatient rehab services with therapies initiated on a 3-hour daily basis consisting of physical therapy, occupational therapy, speech therapy, and rehabilitation nursing.  The following issues were addressed during the patient's rehabilitation stay.  Pertaining to Bryan [name]'s ruptured left posterior communicating artery aneurysm, he had undergone clipping, follow up with Neurosurgery.  He participated fully with his therapies. IVC filter placed 12/11/2016, for left popliteal vein as well as left posterior tibial vein DVT.  Remaining asymptomatic.  Blood pressure is well controlled.  Tracheostomy tube had been downsized, later decannulated.  Oxygen saturations near 100%.  Diet advanced to a regular consistency.  Blood pressures controlled with no orthostatic changes. Morbid obesity, BMI 56.35, dietary followup.  The patient received weekly collaborative interdisciplinary team conferences to discuss  estimated length of stay, family teaching, any barriers to his discharge.  He was ambulating throughout the rehab unit, modified independent, negotiating stairs modified independent, car transfers supervision, no unsafe behavior.  He could participate fully with activities of daily living,  home making, gather his belongings for hygiene, bathing, and dressing.  Ambulating up to 500 feet.  The patient with excellent overall progress during his rehab stay and discharged to home.  DISCHARGE MEDICATIONS:  Included Tylenol as needed.  DIET:  His diet was regular.  FOLLOWUP:  He would follow with Dr. Faith Rogue, the Outpatient Rehab Service office as directed; Dr. Mady Gemma of [place], [facility], medical management; Dr. Conchita Paris, Neurosurgery, call for appointment; Dr. Molli Knock, call for appointment; Dr. Beverlee Nims, call for appointment as needed.  SPECIAL INSTRUCTIONS:  No driving.  No smoking.     Mariam Dollar, P.A.   ______________________________ Ranelle Oyster, M.D.    DA/MEDQ  D:  01/06/2017  T:  01/06/2017  Job:  [id number]  cc:   Felipa Evener, MD Jefry H. Pollyann Kennedy, MD Mady Gemma, [place] [facility] [id number] Ranelle Oyster, M.D.

## 2017-01-06 NOTE — Progress Notes (Signed)
Physical Therapy Session Note  Patient Details  Name: Bryan [name] MRN: [id number] Date of Birth: 17-Nov-1980  Today's Date: 01/06/2017 PT Individual Time: [id number] PT Individual Time Calculation (min): 56 min   Short Term Goals: Week 2:  PT Short Term Goal 1 (Week 2): STG = LTG due to estimated d/c date.   Skilled Therapeutic Interventions/Progress Updates:  Pt received in room & agreeable to tx. Session focused on functional mobility, recall, and word finding. Pt ambulated throughout unit over even & uneven surfaces without AD & mod I, negotiated 12 steps with B rails & mod I, completed bed mobility (on mat table, supine<>sit) with mod I, and car transfer with supervision. Pt was able to path find unit<>north tower with cuing x 1 occasion; pt did not require any rest breaks during task and denied SOB. Pt engaged in game of [place] requiring mod/max cuing to describe word, but pt with increased ease of recalling word therapist was describing. Continued to educate pt on safety (he is not allowed to drive until cleared by MD). At end of session pt left in room.   Pt made mod I in room without AD - RN made aware.  Therapy Documentation Precautions:  Precautions Precautions: Fall Precaution Comments: trach, monitor HR and O2 sats Restrictions Weight Bearing Restrictions: No  Pain: Pain Assessment Pain Assessment: No/denies pain  See Function Navigator for Current Functional Status.   Therapy/Group: Individual Therapy  Sandi MariscalVictoria M [name] 01/06/2017, 11:46 AM

## 2017-01-06 NOTE — Progress Notes (Signed)
Speech Language Pathology Daily Session Note  Patient Details  Name: Bryan [name] MRN: [id number] Date of Birth: [birth year]  Today's Date: 01/06/2017 SLP Individual Time: [id number] SLP Individual Time Calculation (min): 55 min  Short Term Goals: Week 2: SLP Short Term Goal 1 (Week 2): Patient will consume current diet with minimal overt s/s of aspiration and Mod I for use of swallowing compensatory strategies.  SLP Short Term Goal 2 (Week 2): Patient will recall swallowing compenstory strategies with Mod I.  SLP Short Term Goal 3 (Week 2): Patient will recall new, daily information with supervision verbal cues.  SLP Short Term Goal 4 (Week 2): Patient will demonstrate functional problem solving with mildly complex tasks with supervision verbal cues.  SLP Short Term Goal 5 (Week 2): Patient will utilize speech intelligibility strategies (increased vocal intensity) at the sentence level with supervision verbal cues to achieve 90% intelligibility.   Skilled Therapeutic Interventions: Skilled treatment session focused on cognitive goals. SLP facilitated session by re-administering the MoCA (version 7.3). Patient scored 26/30 points with a score of 26 or above considered normal. Patient demonstrated mild impairments in verbal fluency and short-term recall. SLP also facilitated session by educating patient on memory compensatory strategies and how to incorporate them at home to maximize recall and carryover. Handout given to reinforce information. Patient left upright in wheelchair with all needs within reach. Continue with current plan.      Function:  Cognition Comprehension Comprehension assist level: Understands complex 90% of the time/cues 10% of the time  Expression   Expression assist level: Expresses basic 90% of the time/requires cueing < 10% of the time.  Social Interaction Social Interaction assist level: Interacts appropriately 90% of the time - Needs monitoring or encouragement for  participation or interaction.  Problem Solving Problem solving assist level: Solves basic 90% of the time/requires cueing < 10% of the time  Memory Memory assist level: Recognizes or recalls 90% of the time/requires cueing < 10% of the time    Pain Pain Assessment Pain Assessment: No/denies pain  Therapy/Group: Individual Therapy  Bryan [name] 01/06/2017, 11:07 AM

## 2017-01-06 NOTE — Progress Notes (Signed)
Occupational Therapy Session Note  Patient Details  Name: Bryan [name] MRN: [id number] Date of Birth: 02-Sep-1980  Today's Date: 01/06/2017 OT Individual Time: 0703-0800 OT Individual Time Calculation (min): 57 min    Short Term Goals: Week 2:  OT Short Term Goal 1 (Week 2): STGs=LTGs due to ELOS  Skilled Therapeutic Interventions/Progress Updates:    Upon entering the room, pt seated in wheelchair with no c/o pain. Pt verbalized completing bathing and dressing prior OT arrival without assistance. Pt ambulating in room to remove towels and place into dirty linen bag with overall mod I and no use of AD for ambulation. Pt standing at sink for grooming tasks at mod I level as well. Pt ambulating to ADL apartment and demonstrated ability to step over tub ledge at mod I level. Pt stating he prefers use of walk in shower when returning home. Pt ambulating up and down flight of stairs with use of R rail and at overall mod I. Pt ambulating 100' to day room at mod I level and seated for cognitive task of playing black jack 21. Pt dealing cards, shuffling cards, and counting his cards correctly with increased time this session. Pt returning to room at end of session with call bell and all needed items within reach.  Therapy Documentation Precautions:  Precautions Precautions: Fall Precaution Comments: trach, monitor HR and O2 sats Restrictions Weight Bearing Restrictions: No General:   Vital Signs: Oxygen Therapy O2 Device: Not Delivered Pain: Pain Assessment Pain Assessment: No/denies pain  See Function Navigator for Current Functional Status.   Therapy/Group: Individual Therapy  Alen BleacherBradsher, Malahki Gasaway P 01/06/2017, 11:24 AM

## 2017-01-06 NOTE — Progress Notes (Signed)
Occupational Therapy Discharge Summary and OT Intervention  Patient Details  Name: Bryan [name] MRN: [id number] Date of Birth: 1981-01-03  Today's Date: 01/06/2017 OT Individual Time: 1530-1556 OT Individual Time Calculation (min): 26 min    Patient has met 10 of 10 long term goals due to improved activity tolerance, improved balance, ability to compensate for deficits, improved awareness and improved coordination.  Patient to discharge at overall Mod I  level.  Pt not needing family education as he is at mod I level.     Reasons goals not met: all goals met  Recommendation:  No follow up OT intervention needed at discharge  Equipment: Bariatric [facility]  Reasons for discharge: treatment goals met  Patient/family agrees with progress made and goals achieved: Yes   OT Intervention: Upon entering the room, pt ambulates to sink , washes hands, and exits the room at mod I level. Pt ambulating to therapy gym without use of AD at mod I level to obtain shower guards to cover trach site. Pt returning to room, and OT educated and demonstrated placement of shower guard over trach site with [place] of paper tape as needed in order to decrease chance of infection when bathing. Pt returned demonstrations with min verbal cues for proper technique. OT educating pt on expectations for discharge tomorrow and no OT follow up recommendations. Pt asking questions as needed and returning to wheelchair with call bell and all needed items within reach. Pt is mod I in room at this time.   OT Discharge Precautions/Restrictions  Precautions Precautions: Fall Restrictions Weight Bearing Restrictions: No Vital Signs Therapy Vitals Temp: 98.8 F (37.1 C) Temp Source: Oral Pulse Rate: 84 Resp: 18 BP: 133/77 Patient Position (if appropriate): Sitting Oxygen Therapy SpO2: 100 % O2 Device: Not Delivered Pain Pain Assessment Pain Assessment: No/denies pain Vision Baseline Vision/History: No visual  deficits Patient Visual Report: No change from baseline Cognition Arousal/Alertness: Awake/alert Orientation Level: Oriented X4 Sensation Sensation Light Touch: Appears Intact Proprioception: Appears Intact Coordination Gross Motor Movements are Fluid and Coordinated: Yes Fine Motor Movements are Fluid and Coordinated: Yes Motor  Motor Motor - Discharge Observations: generalized deconditioning but improved since initial evaluation Mobility  Bed Mobility Bed Mobility: Rolling Right;Rolling Left;Sit to Supine;Supine to Sit Rolling Right: 6: Modified independent (Device/Increase time) Rolling Left: 6: Modified independent (Device/Increase time) Supine to Sit: 6: Modified independent (Device/Increase time) Sit to Supine: 6: Modified independent (Device/Increase time) Transfers Transfers: Sit to Stand Sit to Stand: 6: Modified independent (Device/Increase time)   Balance Balance Balance Assessed: Yes Dynamic Sitting Balance Dynamic Sitting - Balance Support: No upper extremity supported Dynamic Sitting - Level of Assistance: 6: Modified independent (Device/Increase time) Static Standing Balance Static Standing - Balance Support: During functional activity Static Standing - Level of Assistance: 6: Modified independent (Device/Increase time) Extremity/Trunk Assessment RUE Assessment RUE Assessment: Within Functional Limits LUE Assessment LUE Assessment: Within Functional Limits   See Function Navigator for Current Functional Status.  Gypsy Decant 01/06/2017, 4:19 PM

## 2017-01-06 NOTE — Discharge Summary (Signed)
Discharge summary job [id number]

## 2017-01-06 NOTE — Patient Care Conference (Signed)
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 Morbid obesity ([place])   . Abnormality of gait   . Tracheostomy status ([place])   . Oropharyngeal dysphagia   . 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 Pressure injury of skin 12/25/2016  . Encounter for orogastric (OG) tube placement   . Bad headache   . Syncope   . Endotracheally intubated   . Essential hypertension   . Respiratory failure with hypoxia ([place])   . SAH (subarachnoid hemorrhage) ([place]) 12/01/2016    Expected Discharge Date: Expected Discharge Date: 01/07/17  Team Members Present: Physician leading conference: Dr. Alger Simons Social Worker Present: Lennart Pall, LCSW Nurse Present: Junius Creamer, RN PT Present: Jorge Mandril, PT OT Present: Mariane Masters, OT SLP Present: Weston Anna, SLP PPS Coordinator present : Daiva Nakayama, RN, CRRN     Current Status/Progress Goal Weekly Team Focus  Medical   improving arousal, no stimulant needed. trach out. nutrition solid  trach out, optimizing diet/nutirtion  nutrition, bp mgt   Bowel/Bladder   Continenet of bowel & bladder with episodes of leakage, LBM as per patient 01/05/17  continue continence  continue to monitor   Swallow/Nutrition/  Hydration   regular textures with thin liquids, Mod I  Supervision  diet tolerance   ADL's   Mod I bathing shower level, Mod I UB/LB dressing, Mod I toilet transfers/toileting, supervision shower transfers  mod I - supervision  D/c planning, pt/family education   Mobility   supervision/mod I overall without AD  supervision/mod I  pt education, balance, endurance, awareness, transfers, gait   Communication   decannulated, Mod I  Mod I  Goals Met   Safety/Cognition/ Behavioral Observations  Supervision  Supervision  education    Pain   No c/o pain, has tylenol prn, has not used it  pain scale <3  continue to assess & treat as needed   Skin   scalp incision with steri strips, open trach site from decanulation on 01/05/17, wound to superior border to trach site, small excoriated area to the right shin  no new areas of skin breakdown  continue to assess q shift, monitor closing of trach site & healing of wound    Rehab Goals Patient on target to meet rehab goals: Yes *See Care Plan and progress notes for long and short-term goals.  Barriers to Discharge: size, safety awareness    Possible Resolutions to Barriers:  safety training and education    Discharge Planning/Teaching Needs:  Plans to d/c home with grandparents and other family providing any needed assistance.      Team Discussion:  Medically stable and trach out.  Cognition slowly improving overall.  Made mod independent in room today and team feels could d/c tomorrow.  Recommending OP tx follow  up.  Revisions to Treatment Plan:  None   Continued Need for Acute Rehabilitation Level of Care: The patient requires daily medical management by a physician with specialized training in physical medicine and rehabilitation for the following conditions: Daily direction of a multidisciplinary physical rehabilitation program to ensure safe treatment while eliciting the highest outcome that is of practical value to the patient.:  Yes Daily medical management of patient stability for increased activity during participation in an intensive rehabilitation regime.: Yes Daily analysis of laboratory values and/or radiology reports with any subsequent need for medication adjustment of medical intervention for : Post surgical problems;Neurological problems;Pulmonary problems  Tarek Cravens 01/06/2017, 6:16 PM

## 2017-01-06 NOTE — Progress Notes (Signed)
[place] PHYSICAL MEDICINE & REHABILITATION     PROGRESS NOTE    Subjective/Complaints: Up standing in hall with OT. No complaints. Scratched open part of incision. Tolerated night with trach removed.   ROS: pt denies nausea, vomiting, diarrhea, cough, shortness of breath or chest pain    Objective: Vital Signs: Blood pressure 134/89, pulse 81, temperature 98.7 F (37.1 C), temperature source Oral, resp. rate 16, SpO2 98 %. No results found.  Recent Labs  01/05/17 0607  WBC 4.9  HGB 10.0*  HCT 33.5*  PLT 306    Recent Labs  01/05/17 0607  NA 138  K 3.8  CL 105  GLUCOSE 104*  BUN 7  CREATININE 0.99  CALCIUM 9.2   CBG (last 3)  No results for input(s): GLUCAP in the last 72 hours.  Wt Readings from Last 3 Encounters:  12/25/16 (!) 189.1 kg (417 lb)  02/26/15 (!) 198.7 kg (438 lb)  02/19/15 (!) 198.7 kg (438 lb)    Physical Exam:  Constitutional: NAD. Obese HENT: Normocephalic. Left crani site healing/partially open  Eyes: EOMI are normal. No discharge.  Neck: neck dressing in place, saturated. Not covering stoma. --air leaking Cardiovascular: RRR without murmur. No JVD  Respiratory: CTA Bilaterally without wheezes or rales. Normal effort  GI: Soft. Bowel sounds are normal.   Musculoskeletal: He exhibits no edema.  Neurological: alert and oriented.  Good insight and awareness. Motor: 5/5 UE's and 4+/5 B/l LE. Skin: Warm and dry.    Psych: cooperative  Assessment/Plan: 1. Functional deficits secondary to left p-com aneurysm which require 3+ hours per day of interdisciplinary therapy in a comprehensive inpatient rehab setting. Physiatrist is providing close team supervision and 24 hour management of active medical problems listed below. Physiatrist and rehab team continue to assess barriers to discharge/monitor patient progress toward functional and medical goals.  Function:  Bathing Bathing position   Position: Shower  Bathing parts Body parts  bathed by patient: Right arm, Left arm, Chest, Abdomen, Front perineal area, Right upper leg, Left upper leg, Right lower leg, Left lower leg, Buttocks Body parts bathed by helper: Buttocks, Back  Bathing assist Assist Level: More than reasonable time      Upper Body Dressing/Undressing Upper body dressing   What is the patient wearing?: Hospital gown                Upper body assist Assist Level: No help, No cues   Set up : To obtain clothing/put away  Lower Body Dressing/Undressing Lower body dressing   What is the patient wearing?: Pants, Non-skid slipper socks Underwear - Performed by patient: Thread/unthread right underwear leg, Thread/unthread left underwear leg, Pull underwear up/down   Pants- Performed by patient: Thread/unthread right pants leg, Thread/unthread left pants leg, Pull pants up/down   Non-skid slipper socks- Performed by patient: Don/doff right sock, Don/doff left sock Non-skid slipper socks- Performed by helper: Don/doff right sock, Don/doff left sock Socks - Performed by patient: Don/doff right sock Socks - Performed by helper: Don/doff left sock Shoes - Performed by patient: Don/doff right shoe, Don/doff left shoe            Lower body assist Assist for lower body dressing: Assistive device, More than reasonable time Assistive Device Comment:  (sock aide)    Toileting Toileting   Toileting steps completed by patient: Adjust clothing prior to toileting, Performs perineal hygiene, Adjust clothing after toileting Toileting steps completed by helper: Performs perineal hygiene, Adjust clothing after toileting Toileting Assistive  Devices: Grab bar or Customer service manager level: Supervision or verbal cues   Transfers Chair/bed transfer   Chair/bed transfer method: Ambulatory Chair/bed transfer assist level: No Help, no cues, assistive device, takes more than a reasonable amount of time Chair/bed transfer assistive device: Walker, IT consultant     Max distance: >150 ft (controlled environment) Assist level: No help, No cues, assistive device, takes more than a reasonable amount of time   Wheelchair Wheelchair activity did not occur: Refused Type: Manual Max wheelchair distance: 150 ft (BLE) Assist Level: Supervision or verbal cues  Cognition Comprehension Comprehension assist level: Understands complex 90% of the time/cues 10% of the time  Expression Expression assist level: Expresses basic 90% of the time/requires cueing < 10% of the time.  Social Interaction Social Interaction assist level: Interacts appropriately 90% of the time - Needs monitoring or encouragement for participation or interaction.  Problem Solving Problem solving assist level: Solves basic 90% of the time/requires cueing < 10% of the time  Memory Memory assist level: Recognizes or recalls 90% of the time/requires cueing < 10% of the time  Medical Problem List and Plan: 1. Motor, cognitive, functional deficitssecondary to ruptured left posterior communicating artery aneurysm status post clipping  Continue CIR therapies  -team conference today 2. DVT Prophylaxis/Anticoagulation: Left popliteal vein as well as left posterior tibial vein DVT. Status post IVC filter 12/11/2016 per interventional radiology 3. Pain Management: Tylenol as needed. Denies any pain  4. Mood: Provide emotional support 5. Neuropsych: This patient is intermittentlycapable of making decisions on hisown behalf. 6. Skin/Wound Care: Routine skin checks  -local care to scalp incision again 7. Fluids/Electrolytes/Nutrition:   encourage PO   Off IVF 8.Tracheostomy 12/15/2016. Has OSA but saturations all near 100% with trach plugged   Phonating well.   -decannulated 9.Dysphagia. Upgraded to regular and thins 10.Hypertension. Monitor with increased mobility  Overall controlled   11.Morbid obesity. BMI 56.35 kg Dietary follow-up 12.MRSA PCR screen  positive. Contact precautions  LOS (Days) 11 A FACE TO FACE EVALUATION WAS PERFORMED  Ranelle Oyster, MD 01/06/2017 8:45 AM

## 2017-01-06 NOTE — Progress Notes (Signed)
Social Work Patient ID: Bryan [name], male   DOB: 1981-04-27, 36 y.o.   MRN: [id number]   Met with pt this afternoon to review team conference.  Pt pleased with change in d/c date to tomorrow and feels comfortable with this.  Discussed recommendations for OP f/u tx - agreeable. Called mother and she is also in agreement with d/c tomorrow and OP tx.  Will make arrangements for f/u at [facility].  Harlen Danford, LCSW

## 2017-01-07 NOTE — Progress Notes (Signed)
Pt discharged to home with mom at 0920. Discharge instructions given to pt and mother by Harvel Ricksan Anguilli, PA with verbal understanding. RN educated pt and mother on dressing change management to stoma site. RN discussed risk for infection and proper care to stoma site and scalp incision. No further questions.

## 2017-01-07 NOTE — Progress Notes (Signed)
[place] PHYSICAL MEDICINE & REHABILITATION     PROGRESS NOTE    Subjective/Complaints: Up in chair watching tv. No new complaints. Feels well.   ROS: pt denies nausea, vomiting, diarrhea, cough, shortness of breath or chest pain    Objective: Vital Signs: Blood pressure 114/89, pulse 79, temperature 98.6 F (37 C), temperature source Oral, resp. rate 17, SpO2 97 %. No results found.  Recent Labs  01/05/17 0607  WBC 4.9  HGB 10.0*  HCT 33.5*  PLT 306    Recent Labs  01/05/17 0607  NA 138  K 3.8  CL 105  GLUCOSE 104*  BUN 7  CREATININE 0.99  CALCIUM 9.2   CBG (last 3)  No results for input(s): GLUCAP in the last 72 hours.  Wt Readings from Last 3 Encounters:  12/25/16 (!) 189.1 kg (417 lb)  02/26/15 (!) 198.7 kg (438 lb)  02/19/15 (!) 198.7 kg (438 lb)    Physical Exam:  Constitutional: NAD. Obese HENT: Normocephalic. Left crani site healing/partially open  Eyes: EOMI are normal. No discharge.  Neck: stoma almost closed Cardiovascular: RRR without murmur. No JVD  Respiratory: CTA Bilaterally without wheezes or rales. Normal effort  GI: Soft. Bowel sounds are normal.   Musculoskeletal: He exhibits no edema.  Neurological: alert and oriented.  Good insight and awareness. Motor: 5/5 UE's and 4+/5 B/l LE. Skin:  Scalp wound pink/open Psych: cooperative  Assessment/Plan: 1. Functional deficits secondary to left p-com aneurysm which require 3+ hours per day of interdisciplinary therapy in a comprehensive inpatient rehab setting. Physiatrist is providing close team supervision and 24 hour management of active medical problems listed below. Physiatrist and rehab team continue to assess barriers to discharge/monitor patient progress toward functional and medical goals.  Function:  Bathing Bathing position   Position: Shower  Bathing parts Body parts bathed by patient: Right arm, Left arm, Chest, Abdomen, Front perineal area, Right upper leg, Left  upper leg, Right lower leg, Left lower leg, Buttocks, Back (per pt report) Body parts bathed by helper: Buttocks, Back  Bathing assist Assist Level: More than reasonable time, Assistive device Assistive Device Comment: LH reacher    Upper Body Dressing/Undressing Upper body dressing   What is the patient wearing?: Pull over shirt/dress (per pt report)                Upper body assist Assist Level: No help, No cues   Set up : To obtain clothing/put away  Lower Body Dressing/Undressing Lower body dressing   What is the patient wearing?: Pants, Non-skid slipper socks, Socks Underwear - Performed by patient: Thread/unthread right underwear leg, Thread/unthread left underwear leg, Pull underwear up/down   Pants- Performed by patient: Thread/unthread right pants leg, Thread/unthread left pants leg, Pull pants up/down   Non-skid slipper socks- Performed by patient: Don/doff right sock, Don/doff left sock Non-skid slipper socks- Performed by helper: Don/doff right sock, Don/doff left sock Socks - Performed by patient: Don/doff right sock Socks - Performed by helper: Don/doff left sock Shoes - Performed by patient: Don/doff right shoe, Don/doff left shoe            Lower body assist Assist for lower body dressing: Assistive device, More than reasonable time Assistive Device Comment: sock aide    Toileting Toileting   Toileting steps completed by patient: Adjust clothing prior to toileting, Performs perineal hygiene, Adjust clothing after toileting Toileting steps completed by helper: Performs perineal hygiene, Adjust clothing after toileting Toileting Assistive Devices: Grab bar or  rail  Toileting assist Assist level: More than reasonable time   Transfers Chair/bed transfer   Chair/bed transfer method: Ambulatory Chair/bed transfer assist level: No Help, no cues, assistive device, takes more than a reasonable amount of time Chair/bed transfer assistive device: Walker,  Designer, fashion/clothing     Max distance: >1000 ft Assist level: No help, No cues, assistive device, takes more than a reasonable amount of time   Wheelchair Wheelchair activity did not occur: N/A Type: Manual Max wheelchair distance: 150 ft (BLE) Assist Level: Supervision or verbal cues  Cognition Comprehension Comprehension assist level: Understands complex 90% of the time/cues 10% of the time  Expression Expression assist level: Expresses basic 90% of the time/requires cueing < 10% of the time.  Social Interaction Social Interaction assist level: Interacts appropriately 90% of the time - Needs monitoring or encouragement for participation or interaction.  Problem Solving Problem solving assist level: Solves basic 90% of the time/requires cueing < 10% of the time  Memory Memory assist level: Recognizes or recalls 90% of the time/requires cueing < 10% of the time  Medical Problem List and Plan: 1. Motor, cognitive, functional deficitssecondary to ruptured left posterior communicating artery aneurysm status post clipping Continue CIR therapies  -home today   -follow up with me in amonth 2. DVT Prophylaxis/Anticoagulation: Left popliteal vein as well as left posterior tibial vein DVT. Status post IVC filter 12/11/2016 per interventional radiology 3. Pain Management: Tylenol as needed. Denies any pain  4. Mood: Provide emotional support 5. Neuropsych: This patient is intermittentlycapable of making decisions on hisown behalf. 6. Skin/Wound Care: Routine skin checks  -local care to scalp incision as well as trach  -will need review with family too 7. Fluids/Electrolytes/Nutrition:   encourage PO   Off IVF 8.Tracheostomy 12/15/2016. Has OSA but saturations all near 100% with trach plugged   Phonating well.   -decannulated--hypergranulation tissue 9.Dysphagia. Upgraded to regular and thins 10.Hypertension. Monitor with increased mobility  Overall controlled    11.Morbid obesity. BMI 56.35 kg Dietary follow-up 12.MRSA PCR screen positive. Contact precautions  LOS (Days) 12 A FACE TO FACE EVALUATION WAS PERFORMED  Faith Rogue T, MD 01/07/2017 9:00 AM

## 2017-01-07 NOTE — Progress Notes (Signed)
Physical Therapy Discharge Summary  Patient Details  Name: Bryan [name] MRN: [id number] Date of Birth: Nov 25, 1980  Today's Date: 01/07/2017   Patient has met 8 of 8 long term goals due to improved activity tolerance, improved balance, improved postural control, increased strength, ability to compensate for deficits, improved attention, improved awareness and improved coordination.  Patient to discharge at an ambulatory level Supervision/mod I without an AD.  Patient's mother has been educated on pt's current level of function & reports understanding of education.   Reasons goals not met:  n/a  Recommendation:  Patient will benefit from ongoing skilled PT services in outpatient setting to continue to advance safe functional mobility, address ongoing impairments in decreased recall of new information, decreased endurance & strength, and minimize fall risk.  Equipment: No equipment provided  Reasons for discharge: treatment goals met  Patient/family agrees with progress made and goals achieved: Yes  PT Discharge Precautions/Restrictions Precautions Precautions: Fall Restrictions Weight Bearing Restrictions: No  Vision/Perception  Pt without c/o changes in vision.  Cognition Overall Cognitive Status: Impaired/Different from baseline Arousal/Alertness: Awake/alert Orientation Level: Oriented X4 Memory: Impaired Memory Impairment: Decreased recall of new information  Motor  Motor Motor:  (general weakness)   Mobility Bed Mobility Bed Mobility: Supine to Sit;Sit to Supine Supine to Sit: 6: Modified independent (Device/Increase time) (on mat table) Sit to Supine: 6: Modified independent (Device/Increase time) (on mat table) Transfers Transfers: Yes Sit to Stand: 6: Modified independent (Device/Increase time)  Locomotion  Ambulation Ambulation/Gait Assistance: 6: Modified independent (Device/Increase time) Ambulation Distance (Feet): 1000 Feet Assistive device:  None Stairs / Additional Locomotion Stairs: Yes Stairs Assistance: 6: Modified independent (Device/Increase time) Stair Management Technique: Two rails Number of Stairs: 12 Height of Stairs: 6 (inches) Ramp: 6: Modified independent (Device) Wheelchair Mobility Wheelchair Mobility: No    Balance Balance Balance Assessed: Yes Dynamic Standing Balance Dynamic Standing - Balance Support: No upper extremity supported Dynamic Standing - Level of Assistance: 6: Modified independent (Device/Increase time) Dynamic Standing - Balance Activities:  (during gait)  Extremity Assessment  RLE Assessment RLE Assessment: Within Functional Limits LLE Assessment LLE Assessment: Within Functional Limits   See Function Navigator for Current Functional Status.  Waunita Schooner 01/07/2017, 8:52 AM

## 2017-01-07 NOTE — Discharge Instructions (Signed)
Inpatient Rehab Discharge Instructions  Bryan [name] Discharge date and time: No discharge date for patient encounter.   Activities/Precautions/ Functional Status: Activity: activity as tolerated Diet: regular diet Wound Care: keep wound clean and dry Functional status:  ___ No restrictions     ___ Walk up steps independently ___ 24/7 supervision/assistance   ___ Walk up steps with assistance ___ Intermittent supervision/assistance  ___ Bathe/dress independently ___ Walk with walker     _x__ Bathe/dress with assistance ___ Walk Independently    ___ Shower independently ___ Walk with assistance    ___ Shower with assistance ___ No alcohol     ___ Return to work/school ________   COMMUNITY REFERRALS UPON DISCHARGE:    Outpatient: PT      ST                  [facility] Phone: [phone]               Appointment Date/Time:  (physical therapy)  01/09/17 @ 11:15 am (please arrive at 11:00)                                                                 (speech thearapy)  01/14/17 @ 1:45 pm  Medical Equipment/Items Ordered: bariatric bedside commode                                                     Agency/Supplier:  [facility] @ [phone]   GENERAL COMMUNITY RESOURCES FOR PATIENT/FAMILY:  Support Groups:  Brain Injury Support Group  (handout)      Special Instructions: No driving   My questions have been answered and I understand these instructions. I will adhere to these goals and the provided educational materials after my discharge from the hospital.  Patient/Caregiver Signature _______________________________ Date __________  Clinician Signature _______________________________________ Date __________  Please bring this form and your medication list with you to all your follow-up doctor's appointments.

## 2017-01-07 NOTE — Progress Notes (Signed)
Social Work  Discharge Note  The overall goal for the admission was met for:   Discharge location: Yes - home with grandparents  Length of Stay: Yes - 12 days  Discharge activity level: Yes - modified independent  Home/community participation: Yes  Services provided included: MD, RD, PT, OT, SLP, RN, TR, Pharmacy, Neuropsych and SW  Financial Services: Private Insurance: BCBS [place]  Follow-up services arranged: Outpatient: PT, ST via [place], DME: bariatric bsc via [place] and Patient/Family has no preference for HH/DME agencies  Comments (or additional information):  Patient/Family verbalized understanding of follow-up arrangements: Yes  Individual responsible for coordination of the follow-up plan: pt  Confirmed correct DME delivered: Jericca Russett 01/07/2017    Qunicy Higinbotham

## 2017-01-07 NOTE — Progress Notes (Signed)
Speech Language Pathology Discharge Summary  Patient Details  Name: Bryan [name] MRN: [id number] Date of Birth: 01-12-1981  Patient has met 8 of 8 long term goals.  Patient to discharge at overall Supervision level.   Reasons goals not met: N/A   Clinical Impression/Discharge Summary: Patient has made excellent gains and has met 8 of 8 LTG's this admission. Currently, patient is consuming regular textures with thin liquids with minimal overt s/s of aspiration and requires intermittent supervision for use of swallowing compensatory strategies. Patient is currently decannulated and demonstrates efficient vocal intensity and is 100% intelligible at the conversation level. Patient has made cognitive gains and requires overall supervision cues to complete functional and familiar tasks safely in regards to problem solving, recall and awareness. Patient education is complete and patient will discharge home with supervision from family. Patient would benefit from f/u outpatient SLP services to maximize his cognitive function and overall functional independence.   Care Partner:  Caregiver Able to Provide Assistance: Yes  Type of Caregiver Assistance: Cognitive  Recommendation:  Outpatient SLP (intermittent supervision)  Rationale for SLP Follow Up: Maximize cognitive function and independence;Reduce caregiver burden   Equipment: N/A   Reasons for discharge: Treatment goals met;Discharged from hospital   Patient/Family Agrees with Progress Made and Goals Achieved: Yes     [place], [place] 01/07/2017, 6:44 AM

## 2017-01-07 NOTE — Plan of Care (Signed)
Problem: RH Balance Goal: LTG Patient will maintain dynamic standing balance (PT) LTG:  Patient will maintain dynamic standing balance with assistance during mobility activities (PT)  Outcome: Completed/Met Date Met: 01/07/17 Without AD  Problem: RH Ambulation Goal: LTG Patient will ambulate in controlled environment (PT) LTG: Patient will ambulate in a controlled environment, # of feet with assistance (PT).  Outcome: Completed/Met Date Met: 01/07/17 150 ft without AD Goal: LTG Patient will ambulate in home environment (PT) LTG: Patient will ambulate in home environment, # of feet with assistance (PT).  Outcome: Completed/Met Date Met: 01/07/17 100 ft without AD  Problem: RH Wheelchair Mobility Goal: LTG Patient will propel w/c in controlled environment (PT) LTG: Patient will propel wheelchair in controlled environment, # of feet with assist (PT)  Outcome: Adequate for Discharge D/c goal - pt ambulatory

## 2017-01-09 ENCOUNTER — Ambulatory Visit (HOSPITAL_COMMUNITY)
Payer: No Typology Code available for payment source | Attending: Physical Medicine & Rehabilitation | Admitting: Physical Therapy

## 2017-01-09 DIAGNOSIS — R2681 Unsteadiness on feet: Secondary | ICD-10-CM | POA: Diagnosis not present

## 2017-01-09 DIAGNOSIS — R41841 Cognitive communication deficit: Secondary | ICD-10-CM | POA: Insufficient documentation

## 2017-01-09 NOTE — Therapy (Signed)
[facility] [address] [place], [place], [address] Phone: [phone]   Fax:  [phone]  Physical Therapy Evaluation  Patient Details  Name: Bryan [name] MRN: [id number] Date of Birth: March 16, 1981 Referring Provider: Faith RogueSwartz [name]   Encounter Date: 01/09/2017      PT End of Session - 01/09/17 1207    Visit Number 1   Number of Visits 1   PT Start Time 1130   PT Stop Time 1207   PT Time Calculation (min) 37 min   Activity Tolerance Patient tolerated treatment well   Behavior During Therapy [facility] for tasks assessed/performed      Past Medical History:  Diagnosis Date  . Complication of anesthesia    mom states they almost lost him but not sure if he was given to much medication  . Varicose veins    mutilple bleeding episodes, bilateral lower extremities)    Past Surgical History:  Procedure Laterality Date  . CRANIOTOMY Left 12/02/2016   Procedure: CRANIOTOMY FOR CLIPPING OF INTRACRANIAL ANEURYSM;  Surgeon: Lisbeth RenshawNundkumar, Neelesh, MD;  Location: [facility] OR;  Service: Neurosurgery;  Laterality: Left;  . IR FLUORO RM 30-60 MIN  12/02/2016  . IR IVC FILTER PLMT / S&I /IMG GUID/MOD SED  12/11/2016  . IR US GUIDE VASC ACCESS RIGHT  12/02/2016  . lipo around heart  at age 36   pt states its bc they could hear his  heart beat  . ORIF ANKLE FRACTURE Left 09/02/2012   Procedure: OPEN REDUCTION INTERNAL FIXATION (ORIF) ANKLE FRACTURE;  Surgeon: Valeria BatmanPeter W Whitfield, MD;  Location: [facility] OR;  Service: Orthopedics;  Laterality: Left;  OPEN REDUCTION INTERNAL FIXATION LEFT DISTAL FIBULA FRACTURE WITH REDUCTION FIXATION DISTAL TIBIA/FIBULAR DIASTASIS   . RADIOLOGY WITH ANESTHESIA N/A 12/02/2016   Procedure: RADIOLOGY WITH ANESTHESIA;  Surgeon: Lisbeth RenshawNundkumar, Neelesh, MD;  [facility];  Service: Radiology;  Laterality: N/A;  . TRACHEOSTOMY TUBE PLACEMENT N/A 12/15/2016   Procedure: TRACHEOSTOMY;  Surgeon: Serena Colonelosen, Jefry, MD;  [facility];  Service: ENT;   Laterality: N/A;    There were no vitals filed for this visit.       Subjective Assessment - 01/09/17 1137    Subjective Mr. Bryan ConstantMoore states that he "just fell out" on 12/01/2016.   He was admitted into the hospital on 5/7 with a suparachonoid hemorrhage and went throug in-patient rehab being discharged on 01/06/2017.  He has no restrictions except he is not able to drive until he comes to therapy.  He is not having any difficulty with vision, strength, or balance    How long can you sit comfortably? no problem    How long can you stand comfortably? no limitation    How long can you walk comfortably? no limitation    Patient Stated Goals To be able to drive and go back to work again .    Currently in Pain? No/denies            [facility] PT Assessment - 01/09/17 0001      Assessment   Medical Diagnosis Subarachnoaid hemorrhage   Referring Provider Bryan [name]    Onset Date/Surgical Date 12/01/16   Next MD Visit 02/07/2017     Precautions   Precautions None     Restrictions   Weight Bearing Restrictions No     Balance Screen   Has the patient fallen in the past 6 months No   Has the patient had a decrease in activity level because of a  fear of falling?  No   Is the patient reluctant to leave their home because of a fear of falling?  No     Home Tourist information centre manager residence     Prior Function   Level of Independence Independent     Cognition   Overall Cognitive Status Within Functional Limits for tasks assessed     Functional Tests   Functional tests Squat;Single leg stance;Sit to Stand;Floor to Stand     Single Leg Stance   Comments unable to single leg stance on either leg.  Pt feels that this is due to his weight.       Sit to Stand   Comments 5 sit to stand in 8.3 seconds      Floor to Stand   Comments needs chair due to weight      ROM / Strength   AROM / PROM / Strength Strength     Strength   Overall Strength Comments LE functional       Balance   Balance Assessed Yes     Standardized Balance Assessment   Standardized Balance Assessment Timed Up and Go Test     Timed Up and Go Test   Cognitive TUG (seconds) 13.73            Objective measurements completed on examination: See above findings.                  PT Education - 01/09/17 1206    Education provided Yes   Education Details For balance   Person(s) Educated Patient   Methods Explanation   Comprehension Verbalized understanding          PT Short Term Goals - 01/09/17 1213      PT SHORT TERM GOAL #1   Title Pt to be able to single leg stance for up to 10 seconds to allow pt to be confident walking on uneven ground. (To be completed on his own 3 weeks).    Time 3   Period Weeks   Status New                   Plan - 01/09/17 1207    Clinical Impression Statement Bryan [name] is a 36 yo who had a subarachnoid hemorrhage on 12/01/2016.   He was in acute and inpatient therapy until 01/06/2017.  He has currently been referred to skilled out-patient therapy for continues balance and endurance activity.  Bryan [name] does not see why he has been referred to out-patient therapy.  He states that he has had no difficulty in any of his ADL's or his mobility since he has left the hospital.  He does have some balance deficts but states, with some validitiy, that anyone over 400 pounds is going to have trouble with his balance.  I gave Bryan [name] a HEP to work on his balance but I do not feel that his deficits require skilled care at this time.    Clinical Presentation Stable   Clinical Decision Making Low   Rehab Potential Good   PT Next Visit Plan Discharge to HEP    Consulted and Agree with Plan of Care Patient      Patient will benefit from skilled therapeutic intervention in order to improve the following deficits and impairments:  Cardiopulmonary status limiting activity  Visit Diagnosis: Unsteadiness on feet - Plan: PT plan of  care cert/re-cert     Problem List Patient Active Problem List  Diagnosis Date Noted  . Morbid obesity (HCC)   . Abnormality of gait   . Tracheostomy status (HCC)   . Oropharyngeal dysphagia   . 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 Pressure injury of skin 12/25/2016  . Encounter for orogastric (OG) tube placement   . Bad headache   . Syncope   . Endotracheally intubated   . Essential hypertension   . Respiratory failure with hypoxia (HCC)   . SAH (subarachnoid hemorrhage) (HCC) 12/01/2016   Virgina Organ, PT CLT [phone] 01/09/2017, 12:22 PM  [facility] [address] [place], [place], [address] Phone: [phone]   Fax:  [phone]  Name: Bryan [name] MRN: [id number] Date of Birth: 12-25-1980

## 2017-01-09 NOTE — Patient Instructions (Addendum)
Feet Heel-Toe "Tandem" (Compliant Surface) Arm Motion - Eyes Open    With eyes open, standing on compliant surface: pillow or foam________, right foot directly in front of the other, move arms up and down: to front. Repeat _10___ times per session. Do __1__ sessions per day.  Copyright  VHI. All rights reserved.    At kitchen counter attempt to stand on one foot.  Repeat to opposite foot.  Complete 5 times.

## 2017-01-14 ENCOUNTER — Ambulatory Visit (HOSPITAL_COMMUNITY): Payer: No Typology Code available for payment source | Admitting: Speech Pathology

## 2017-01-14 ENCOUNTER — Encounter (HOSPITAL_COMMUNITY): Payer: Self-pay | Admitting: Speech Pathology

## 2017-01-14 DIAGNOSIS — R2681 Unsteadiness on feet: Secondary | ICD-10-CM | POA: Diagnosis not present

## 2017-01-14 DIAGNOSIS — R41841 Cognitive communication deficit: Secondary | ICD-10-CM

## 2017-01-14 NOTE — Therapy (Signed)
[facility] [address] [place], [place], [address] Phone: [phone]   Fax:  [phone]  Speech Language Pathology Evaluation  Patient Details  Name: Bryan [name] MRN: [id number] Date of Birth: Aug 07, 1980 Referring Provider: Faith Rogue  Encounter Date: 01/14/2017      End of Session - 01/14/17 1432    Visit Number 1   Number of Visits 1   Authorization Type BCBS   SLP Start Time 1352   SLP Stop Time  1430   SLP Time Calculation (min) 38 min   Activity Tolerance Patient tolerated treatment well      Past Medical History:  Diagnosis Date  . Complication of anesthesia    mom states they almost lost him but not sure if he was given to much medication  . Varicose veins    mutilple bleeding episodes, bilateral lower extremities)    Past Surgical History:  Procedure Laterality Date  . CRANIOTOMY Left 12/02/2016   Procedure: CRANIOTOMY FOR CLIPPING OF INTRACRANIAL ANEURYSM;  Surgeon: Lisbeth Renshaw, MD;  [facility];  Service: Neurosurgery;  Laterality: Left;  . IR FLUORO RM 30-60 MIN  12/02/2016  . IR IVC FILTER PLMT / S&I /IMG GUID/MOD SED  12/11/2016  . IR US GUIDE VASC ACCESS RIGHT  12/02/2016  . lipo around heart  at age 26   pt states its bc they could hear his  heart beat  . ORIF ANKLE FRACTURE Left 09/02/2012   Procedure: OPEN REDUCTION INTERNAL FIXATION (ORIF) ANKLE FRACTURE;  Surgeon: Valeria Batman, MD;  [facility];  Service: Orthopedics;  Laterality: Left;  OPEN REDUCTION INTERNAL FIXATION LEFT DISTAL FIBULA FRACTURE WITH REDUCTION FIXATION DISTAL TIBIA/FIBULAR DIASTASIS   . RADIOLOGY WITH ANESTHESIA N/A 12/02/2016   Procedure: RADIOLOGY WITH ANESTHESIA;  Surgeon: Lisbeth Renshaw, MD;  Location[facility] OR;  Service: Radiology;  Laterality: N/A;  . TRACHEOSTOMY TUBE PLACEMENT N/A 12/15/2016   Procedure: TRACHEOSTOMY;  Surgeon: Serena Colonel, MD;  [facility];  Service: ENT;  Laterality: N/A;    There were  no vitals filed for this visit.      Subjective Assessment - 01/14/17 1615    Subjective "I think I am doing pretty good."   Special Tests MoCA   Currently in Pain? No/denies            SLP Evaluation [facility] - 01/14/17 1615      SLP Visit Information   SLP Received On 01/14/17   Referring Provider Faith Rogue   Onset Date 12/01/2016   Medical Diagnosis SAH s/p Aneurysm     Subjective   Subjective "I think I am doing pretty good."     General Information   HPI Pt is a 36 y.o.male[facility] secondary to aneurysm, s/p clipping 5/8 and complicated by vasospasm. He became hypoxic requiring intubation 5/8 and trach 5/21. Pt had MBS on 5/29 recommending honey thick liquids, has since been upgraded to regular textures and thin liquids. He was seen for inpatient rehab and discharged home. He was referred for outpatient SLP evaluation by Dr. Riley Kill. PMH includes mordbid obesity and varicose veins.       Behavioral/Cognition  alert and coop- Acupuncturist Status  ambulatory          Prior Functional Status  Cognitive/Linguistic Baseline  WFL          Type of Home  House  Lives With  Family          Available Support  Family          Education  college          Vocation  Full time empl- oyment          Pain Assessment  Pain Assessment  No/denies pain          Cognition  Overall Cognitive Status  Within Functio- nal Limits for tasks assessed          Auditory Comprehension  Overall Auditory Comprehension  Appears within functional lim- its for tasks assessed          Yes/No Questions  WFL          Commands  WFL          Conversation  Complex          Visual Recognition/Discrimination  Discrimination  WFL          Reading Comprehension  Reading Status  WFL          Expression  Primary Mode of Expression  Verbal          Verbal Expression  Overall Verbal Expression  Appears within functional lim- its for  tasks assessed          Initiation  No impairment          Level of Generative/Spontaneous Verbalization  Conversation          Repetition  No impairment          Naming  No impairment          Pragmatics  No impairment          Non-Verbal Means of Communication  Not applicable          Written Expression  Dominant Hand  Right          Written Expression  Not tested          Oral Motor/Sensory Function  Overall Oral Motor/Sensory Function  Appears within functional lim- its for tasks assessed          Motor Speech  Overall Motor Speech  Other (comment)            trach is closi- ng- still some mild hoarseness          Respiration  WFL          Phonation  Normal;Hoarse          Resonance  [facility]FL          Articulation  WFL          Intelligibility  Intelligible          Standardized Assessments  Standardized Assessments   Montreal Cogni- tive Assessment (MOCA)          Montreal Cognitive Assessment [facility](MOCA)   28/30           Clinical Impression Bryan [name] is a 36 yo male who was referred for SLP evaluation following [facility]. Pt states that he feels like he is back to baseline (originally followed by SLP for dysphagia, trach care, and mild cognitive changes). The MoCA was administered and Pt scored within normal limits (29/30) with one error in serial 7 subtraction. He has not yet returned to driving and is awaiting clearance from MD. 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 He stated that he was unable to provide a  detailed description regarding job duties due to the sensitive nature of his employment. He did state that he felt prepared to be able to communicate with coworkers without issue. Pt still has healing stoma from recent trach and possibly mild hoarse vocal quality. He is consuming a regular diet with all liquids. No further SLP intervention indicated at this time. SLP did suggest returning to work gradually (shorter  days or midweek) once he was released by physician, however he stated that this may not be allowed. SLP also provided my contact information should he have questions or note concerns after returning to work related to cognitive communication changes not detected in this environment.   Patient will benefit from skilled therapeutic intervention in order to improve the following deficits and impairments:   N/A (assessed for cognitive communication deficit)     Problem List Patient Active Problem List   Diagnosis Date Noted  . Morbid obesity (HCC)   . Abnormality of gait   . Tracheostomy status (HCC)   . Oropharyngeal dysphagia   . 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 Pressure injury of skin 12/25/2016  . Encounter for orogastric (OG) tube placement   . Bad headache   . Syncope   . Endotracheally intubated   . Essential hypertension   . Respiratory failure with hypoxia (HCC)   . SAH (subarachnoid hemorrhage) (HCC) 12/01/2016   Thank you,  Havery Moros, CCC-SLP [phone]  PORTER,DABNEY 01/14/2017, 4:19 PM  [facility] [address] [place], [place], [address] Phone: [phone]   Fax:  [phone]  Name: Bryan [name] MRN: [id number] Date of Birth: Sep 16, 1980

## 2017-01-19 NOTE — Addendum Note (Signed)
Addended by: Dorene ArPORTER, Maryclaire Stoecker V on: 01/19/2017 02:44 PM   Modules accepted: Orders

## 2017-02-06 NOTE — Anesthesia Postprocedure Evaluation (Signed)
Anesthesia Post Note  Patient: Bryan [name]  Procedure(s) Performed: Procedure(s) (LRB): RADIOLOGY WITH ANESTHESIA (N/A)     Anesthesia Post Evaluation  Last Vitals:  Vitals:   12/26/16 1156 12/26/16 1244  BP: (!) 111/37 121/81  Pulse: 80 81  Resp: 20 18  Temp:  37 C    Last Pain:  Vitals:   12/26/16 1244  TempSrc: Oral  PainSc:                  Jiles GarterJACKSON,Pheonix Clinkscale EDWARD

## 2017-02-06 NOTE — Addendum Note (Signed)
Addendum  created 02/06/17 1233 by Necola Bluestein, MD   Sign clinical note    

## 2017-02-23 ENCOUNTER — Encounter: Payer: Self-pay | Admitting: Physical Medicine & Rehabilitation

## 2017-02-24 ENCOUNTER — Encounter: Payer: BLUE CROSS/BLUE SHIELD | Admitting: Physical Medicine & Rehabilitation

## 2017-02-25 ENCOUNTER — Encounter
Payer: BLUE CROSS/BLUE SHIELD | Attending: Physical Medicine & Rehabilitation | Admitting: Physical Medicine & Rehabilitation

## 2017-03-19 NOTE — Addendum Note (Signed)
Addendum  created 03/19/17 1450 by Tavarious Freel, MD   Sign clinical note    

## 2017-04-01 ENCOUNTER — Encounter: Payer: Self-pay | Attending: Physical Medicine & Rehabilitation | Admitting: Physical Medicine & Rehabilitation

## 2017-04-01 ENCOUNTER — Encounter: Payer: Self-pay | Admitting: Physical Medicine & Rehabilitation

## 2017-04-01 VITALS — BP 145/112 | HR 86 | Resp 14

## 2017-04-01 DIAGNOSIS — Z6841 Body Mass Index (BMI) 40.0 and over, adult: Secondary | ICD-10-CM | POA: Insufficient documentation

## 2017-04-01 DIAGNOSIS — I6032 Nontraumatic subarachnoid hemorrhage from left posterior communicating artery: Secondary | ICD-10-CM | POA: Insufficient documentation

## 2017-04-01 DIAGNOSIS — I10 Essential (primary) hypertension: Secondary | ICD-10-CM | POA: Insufficient documentation

## 2017-04-01 NOTE — Progress Notes (Signed)
Subjective:    Patient ID: Bryan [name], male    DOB: 10/26/1980, 36 y.o.   MRN: [id number]  HPI  This is a hospital follow up for [name] after his left PCom aneurysm. He has finished therapy months ago---was at outpt neuro-rehab briefly. He was discharged from inpatient rehab in mid June. He is essentially independent at home although he needs some help with getting his socks on his right foot. He is walking independently and make simple meals.   He denies any pain at present. Headaches are improving. His bowels and bladder are working normally. His sleep is sometimes inconsistent but overall stable. He denies any problems breathing at night or activity.   He is off all medications currently. He recently saw his PCP and told me assessment was normal.      Pain Inventory Average Pain 0 Pain Right Now 0 My pain is other  In the last 24 hours, has pain interfered with the following? General activity 8 Relation with others 9 Enjoyment of life 9 What TIME of day is your pain at its worst? no pain Sleep (in general) Fair  Pain is worse with: no pain Pain improves with: no pain Relief from Meds: 0  Mobility walk without assistance how many minutes can you walk? ? ability to climb steps?  no do you drive?  yes Do you have any goals in this area?  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 No pertinent family history. Social History   Social History  . Marital status: Single    Spouse name: N/A  . Number of children: N/A  . Years of education: N/A   Social History Main Topics  . Smoking status: Never Smoker  . Smokeless tobacco: Current User    Types: Chew  . Alcohol use 0.0 oz/week     Comment: couple of times a week  . Drug use: No  . Sexual activity: No   Other Topics Concern  .  None   Social History Narrative  . None   Past Surgical History:  Procedure Laterality Date  . CRANIOTOMY Left 12/02/2016   Procedure: CRANIOTOMY FOR CLIPPING OF INTRACRANIAL ANEURYSM;  Surgeon: Lisbeth Renshaw, MD;  Location: [facility] OR;  Service: Neurosurgery;  Laterality: Left;  . IR FLUORO RM 30-60 MIN  12/02/2016  . IR IVC FILTER PLMT / S&I /IMG GUID/MOD SED  12/11/2016  . IR US GUIDE VASC ACCESS RIGHT  12/02/2016  . lipo around heart  at age 43   pt states its bc they could hear his  heart beat  . ORIF ANKLE FRACTURE Left 09/02/2012   Procedure: OPEN REDUCTION INTERNAL FIXATION (ORIF) ANKLE FRACTURE;  Surgeon: Valeria Batman, MD;  Location: [facility] OR;  Service: Orthopedics;  Laterality: Left;  OPEN REDUCTION INTERNAL FIXATION LEFT DISTAL FIBULA FRACTURE WITH REDUCTION FIXATION DISTAL TIBIA/FIBULAR DIASTASIS   . RADIOLOGY WITH ANESTHESIA N/A 12/02/2016   Procedure: RADIOLOGY WITH ANESTHESIA;  Surgeon: Lisbeth Renshaw, MD;  [facility];  Service: Radiology;  Laterality: N/A;  . TRACHEOSTOMY TUBE PLACEMENT N/A 12/15/2016   Procedure: TRACHEOSTOMY;  Surgeon: Serena Colonel, MD;  Location: [facility] OR;  Service: ENT;  Laterality: N/A;   Past Medical History:  Diagnosis Date  . Complication of anesthesia    mom states they almost lost him but  not sure if he was given to much medication  . Varicose veins    mutilple bleeding episodes, bilateral lower extremities)   BP (!) 145/112 (BP Location: Left Wrist, Patient Position: Sitting, Cuff Size: Normal)   Pulse 86   Resp 14   SpO2 95%   Opioid Risk Score:   Fall Risk Score:  `1  Depression screen PHQ 2/9  No flowsheet data found.  Review of Systems  Constitutional: Negative.   HENT: Negative.   Eyes: Negative.   Respiratory: Negative.   Cardiovascular: Negative.   Gastrointestinal: Negative.   Endocrine: Negative.   Genitourinary: Negative.   Musculoskeletal: Negative.   Skin: Negative.   Allergic/Immunologic: Negative.     Neurological: Negative.   Hematological: Negative.   Psychiatric/Behavioral: Negative.        Objective:   Physical Exam       Constitutional: NAD. Obese     Eyes: EOMI are normal. No discharge.  Neck: old scar from trach Cardiovascular: RRR Respiratory: CTA B  GI: Soft. Bowel sounds are normal.   Musculoskeletal: He exhibits no edema.  Neurological: alert and oriented.  Normal insight and awareness. Motor: 5/5 UE's and 5/5 B/l LE. Normal balance and ROM. Sensory normal  Skin:  Scalp wound pink/open Psych: cooperative       Assessment & Plan:  Medical Problem List and Plan: 1. 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 I completed paperwork to indicate such 2. Hypertension. Elevated today. Tells me bp was normal at PCP  -follow up with PCP as indicated.   -I advised him to pic up a BP cuff. 3.Morbid obesity. BMI 56.35 kg    FOllow up with me prn. Fifteen minutes of face to face patient care time were spent during this visit. All questions were encouraged and answered.

## 2017-04-01 NOTE — Patient Instructions (Addendum)
PLEASE FEEL FREE TO CALL OUR OFFICE WITH ANY PROBLEMS OR QUESTIONS [phone])    KEEP AN EYE ON YOUR BLOOD PRESSURE!!!!

## 2018-04-18 IMAGING — DX DG CHEST 1V PORT
1 series · 1 of 1 positions shown · non-contrast
Comparison: Portable chest x-ray December 07, 2016.

CLINICAL DATA: Acute respiratory failure with hypoxia

EXAM:
PORTABLE CHEST 1 VIEW

[chest ap]
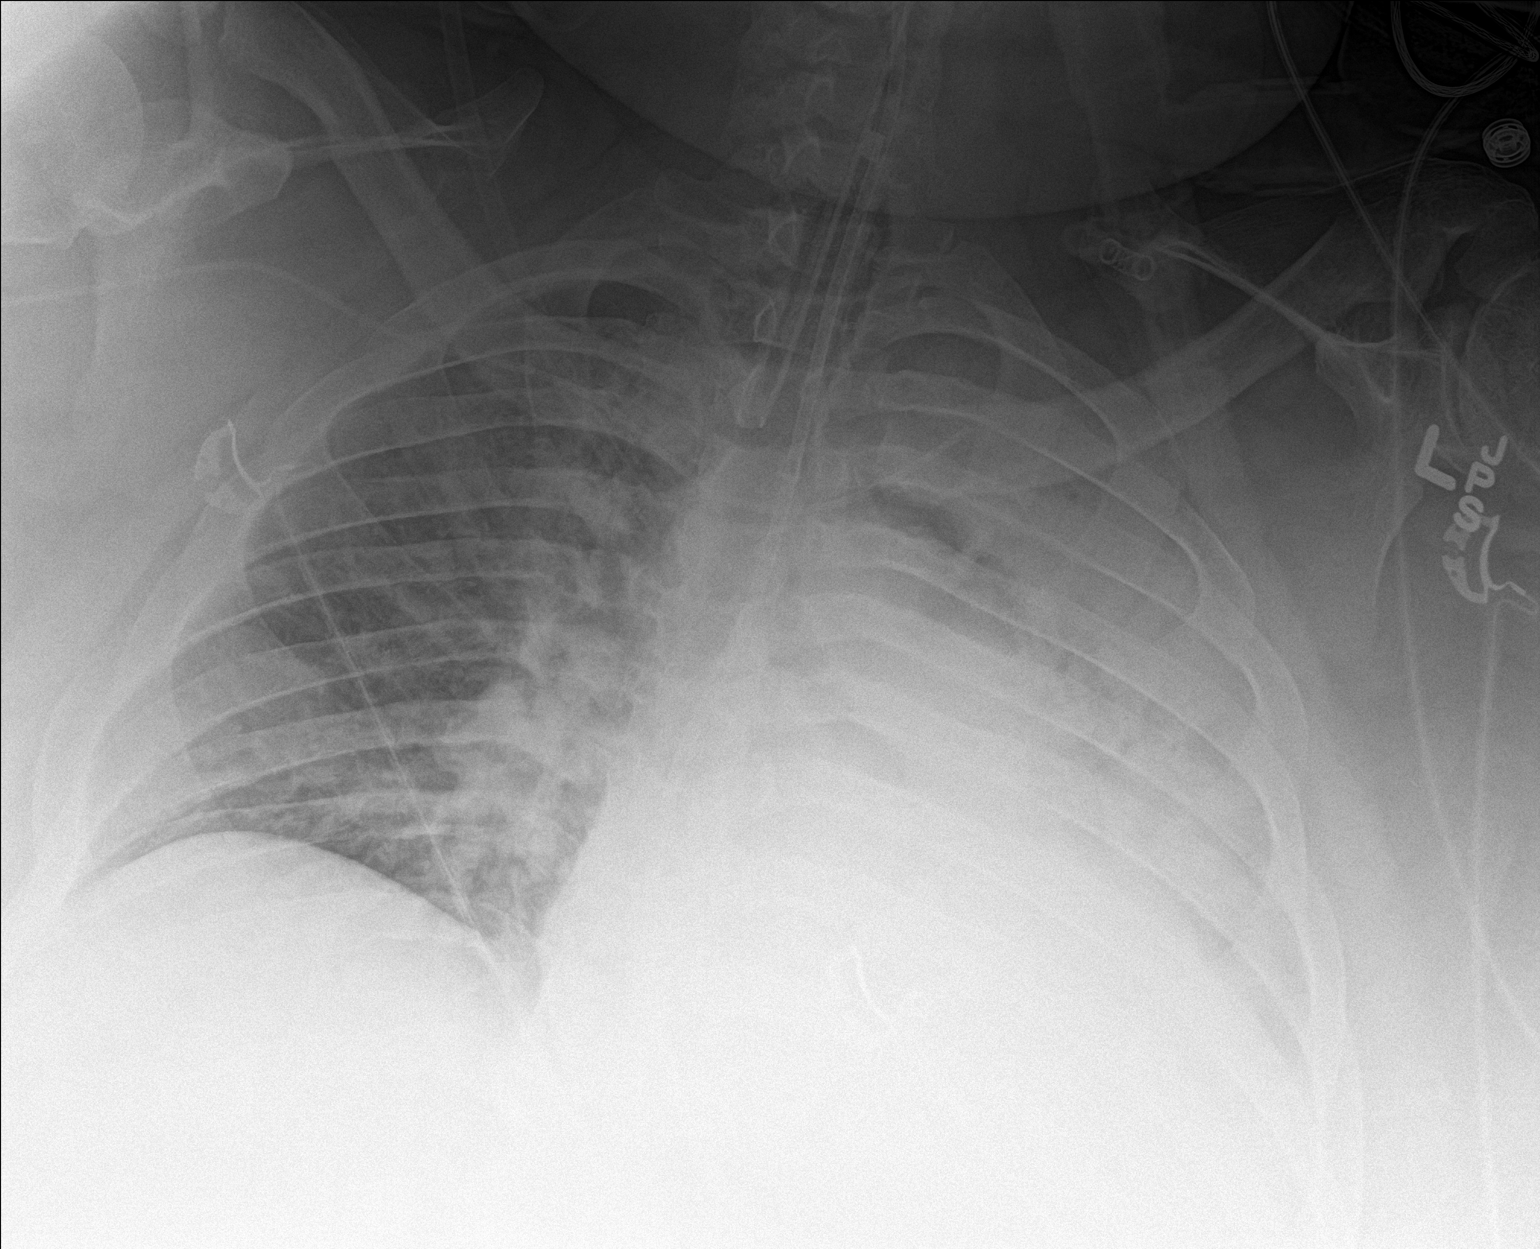

[1 of 1 positions shown; findings below may reference images not displayed]

FINDINGS: There has been further deterioration in the appearance of the left
hemithorax with only a very small amount of aerated lung parenchyma
present. The left heart border is obscured. On the right the lung is
adequately inflated. The pulmonary vascularity is engorged. The
endotracheal tube tip lies 4.7 cm above the carina. The feeding tube
tip projects below the inferior margin of the image. The PICC line
tip crosses the midline from right to left and appears to terminate
in the distal aspect of the left subclavian vein or junction of the
left subclavian vein with the left internal jugular vein.
IMPRESSION: Interval worsening in the appearance of the left lung compatible
with atelectasis or pneumonia or less likely asymmetric pulmonary
edema. There is pulmonary vascular congestion on the right more
conspicuous today without significant edema.

Repositioning of the PICC line is recommended to assure that it lies
within the superior vena cava.

## 2018-04-19 IMAGING — DX DG ABD PORTABLE 1V
1 series · 1 of 1 positions shown · non-contrast
Comparison: Abdominal radiograph of December 02, 2016

CLINICAL DATA: Assess feeding tube positioning.

EXAM:
PORTABLE ABDOMEN - 1 VIEW

[abdomen kub]
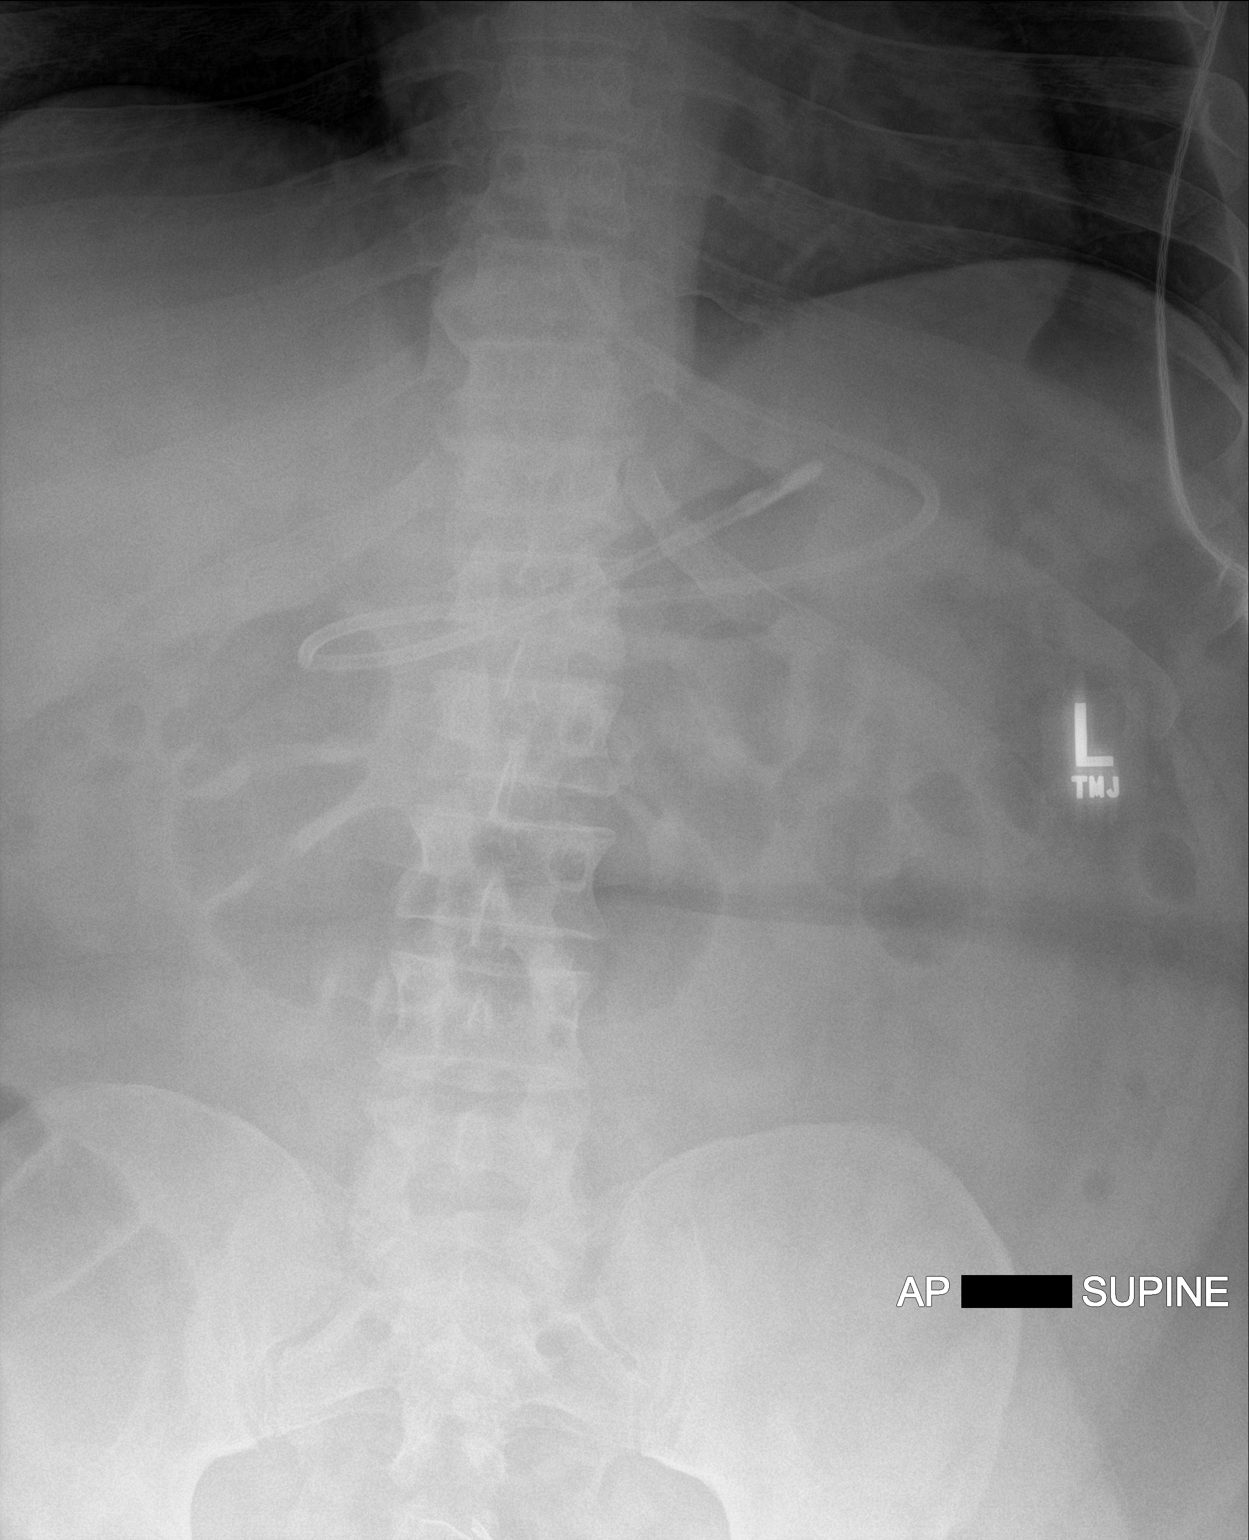

[1 of 1 positions shown; findings below may reference images not displayed]

FINDINGS: The radiodense tipped feeding tube is coiled with coiled within the
stomach with the tip oriented toward the cardia. There are loops of
gas-filled bowel in the mid abdomen.
IMPRESSION: The tip of the feeding tube is oriented toward the gastric cardia
with a portion of the tube extending as far to the right as the
pyloric region. If the patient can tolerate right-side-down
decubitus positioning, this may help propel the tube tip through the
pylorus into the duodenum.

## 2018-04-19 IMAGING — DX DG CHEST 1V PORT
1 series · 1 of 1 positions shown · non-contrast
Comparison: 12/09/2016

CLINICAL DATA: Check endotracheal tube placement

EXAM:
PORTABLE CHEST 1 VIEW

[chest ap]
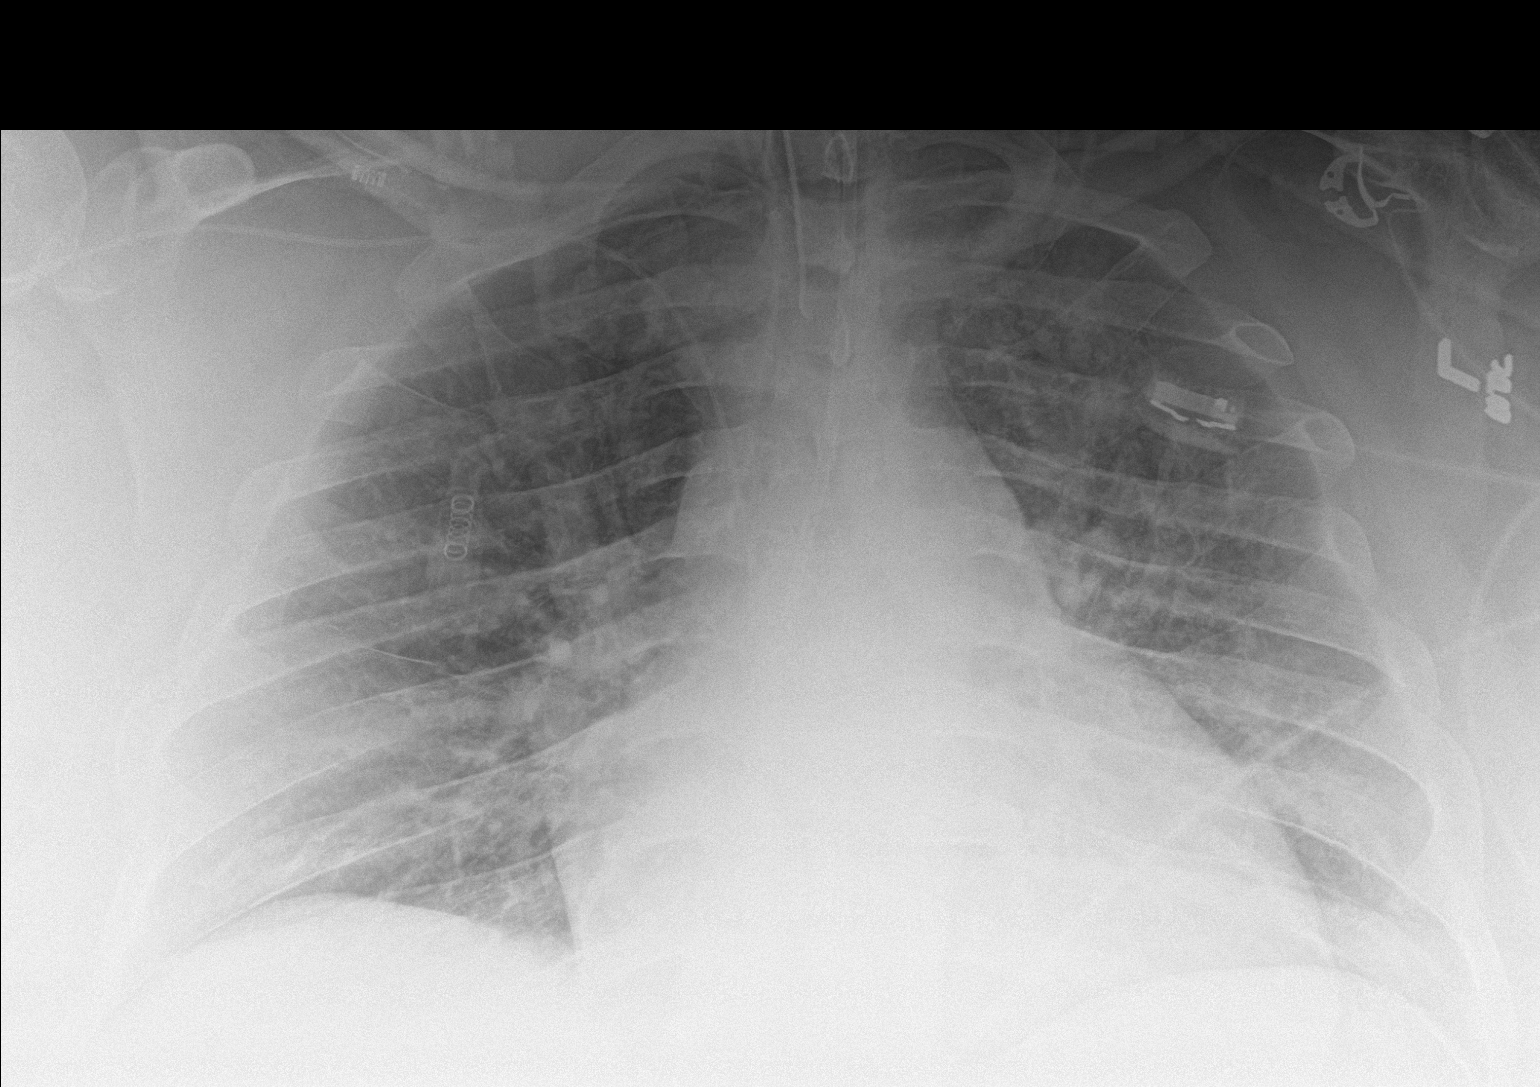

[1 of 1 positions shown; findings below may reference images not displayed]

FINDINGS: Cardiac shadow is mildly prominent but accentuated by the portable
technique. Endotracheal tube and feeding catheter are again noted
and stable. A right-sided PICC line is noted in satisfactory
position. Mild vascular congestion is noted without focal confluent
infiltrate. The previously seen left-sided infiltrate has
significantly improved in the interval. No bony abnormality is
noted.
IMPRESSION: Persistent vascular congestion.

Tubes and lines as described.

Improved infiltrative change on the left.

## 2018-04-20 IMAGING — XA IR IVC FILTER PLMT / S&I /IMG GUID/MOD SED
2 series · 6 of 6 positions shown · non-contrast
Comparison: none

CLINICAL DATA: Subarachnoid hemorrhage, status post cerebral
aneurysm clipping and documented left popliteal and posterior tibial
vein DVT. IVC filter placement has been requested due to
contraindication to receive anticoagulation currently.

[Series 1: body 4 care · 4 of 36 frames shown]
[frame 6/36]
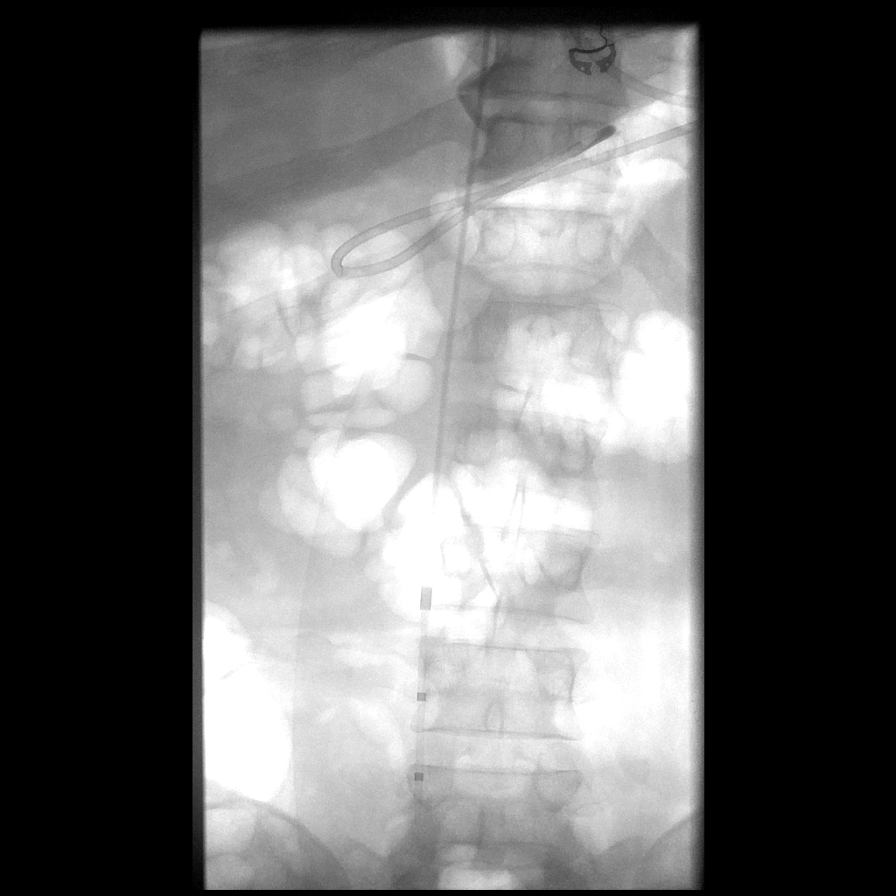
[frame 19/36]
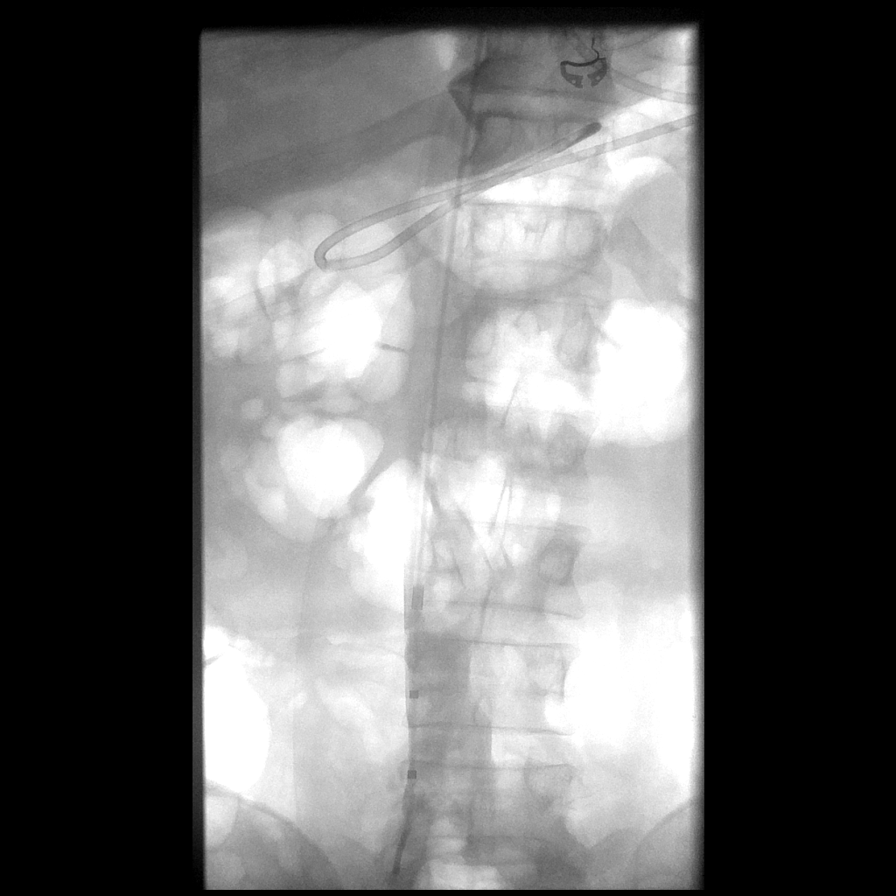
[frame 26/36]
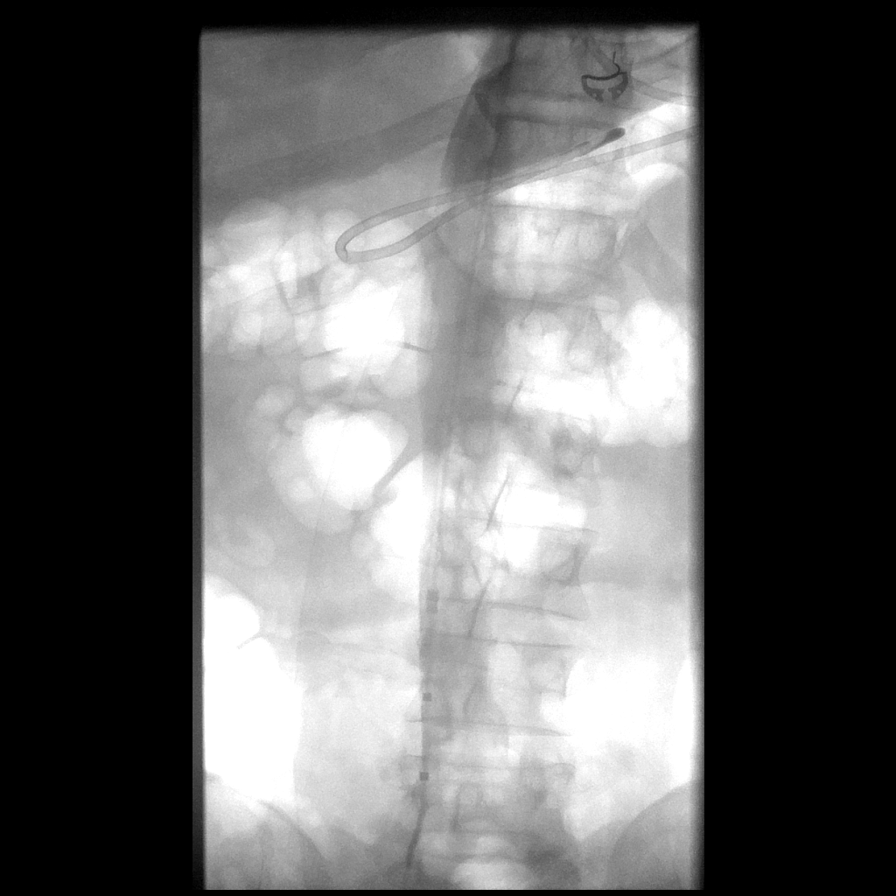
[frame 31/36]
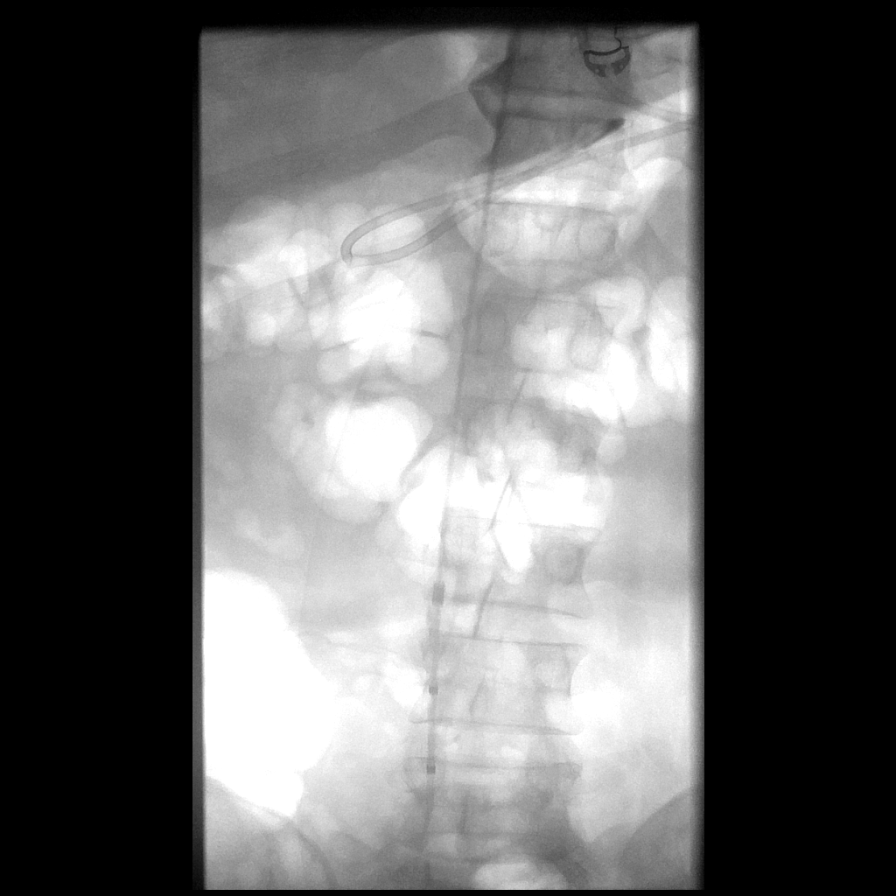

[Series 2: fl (-) angio · 2 of 2 slices shown]
[im 1/2]
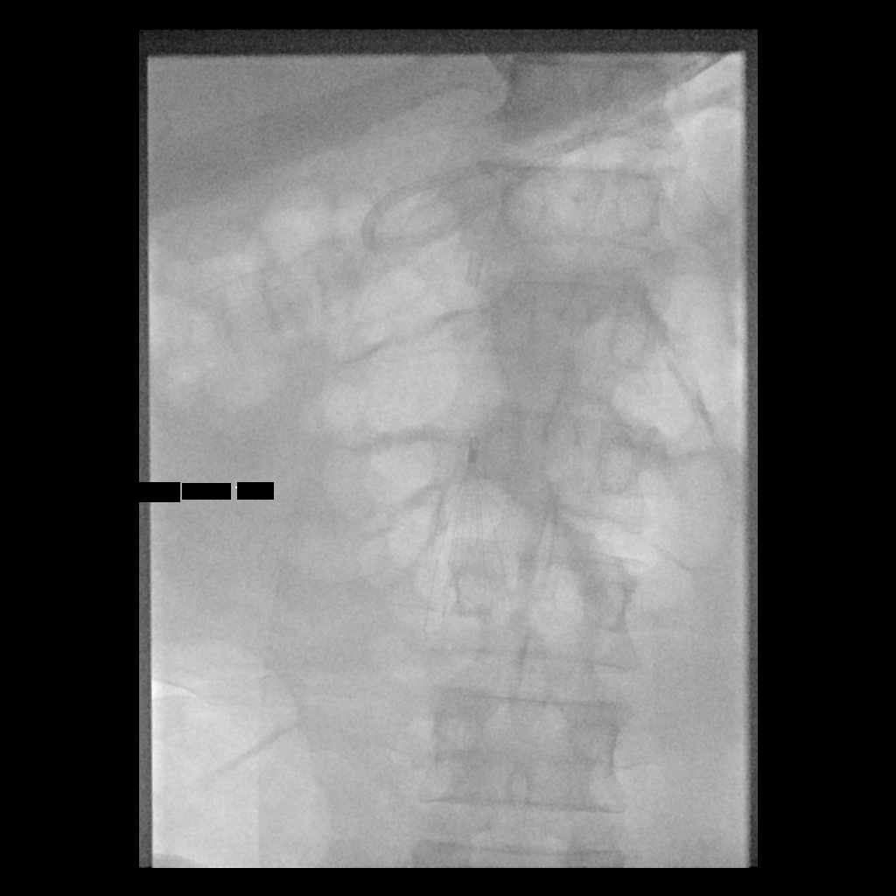
[im 2/2]
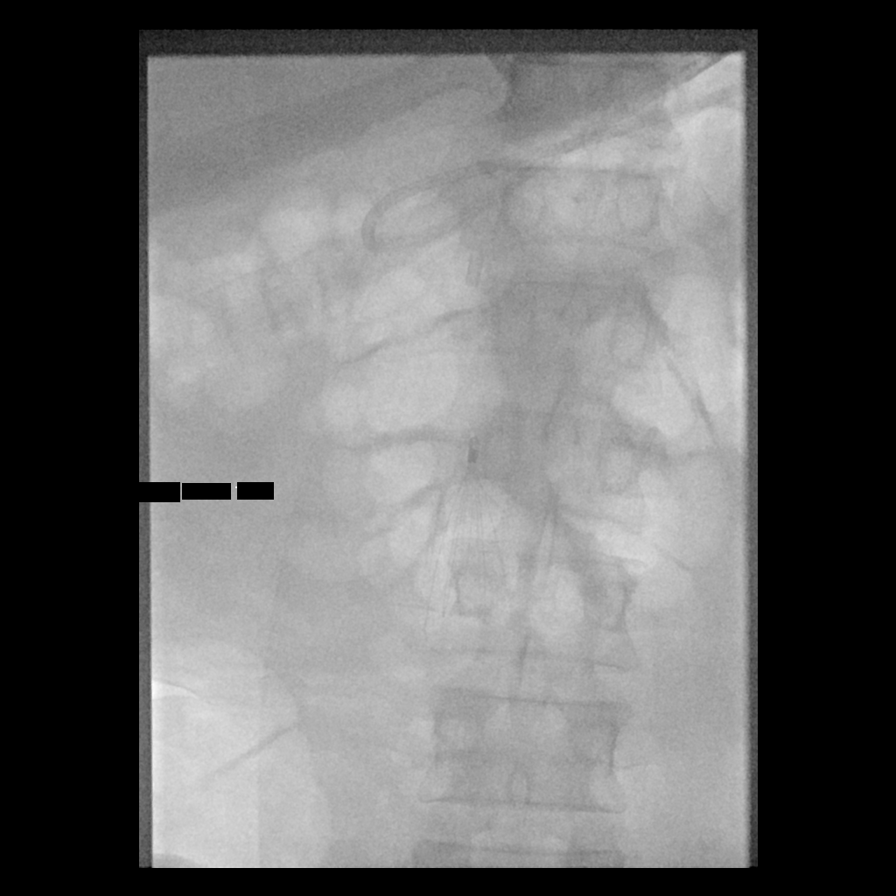

[6 of 6 positions shown; findings below may reference images not displayed]

EXAM:
1. ULTRASOUND GUIDANCE FOR VASCULAR ACCESS OF THE RIGHT INTERNAL
JUGULAR VEIN.
2. IVC VENOGRAM.
3. PERCUTANEOUS IVC FILTER PLACEMENT.

ANESTHESIA/SEDATION:
None

CONTRAST:  30 mL [id number]

FLUOROSCOPY TIME:  1 minutes and 42 seconds.  194 mGy.

PROCEDURE:
The procedure, risks, benefits, and alternatives were explained to
the patient. Questions regarding the procedure were encouraged and
answered. The patient understands and consents to the procedure. A
time-out was performed prior to initiating the procedure.

The right neck was prepped with chlorhexidine in a sterile fashion,
and a sterile drape was applied covering the operative field. A
sterile gown and sterile gloves were used for the procedure. Local
anesthesia was provided with 1% Lidocaine.

Ultrasound was utilized to confirm patency of the right internal
jugular vein. Under direct ultrasound guidance, a 21 gauge needle
was advanced into the right internal jugular vein with ultrasound
image documentation performed. After securing access with a
micropuncture dilator, a guidewire was advanced into the inferior
vena cava. A deployment sheath was advanced over the guidewire. This
was utilized to perform IVC venography.

The deployment sheath was further positioned in an appropriate
location for filter deployment. Fandl Gru IVC filter was then
advanced in the sheath. This was then fully deployed in the
infrarenal IVC. Final filter position was confirmed with a
fluoroscopic spot image. After the procedure the sheath was removed
and hemostasis obtained with manual compression.

COMPLICATIONS:
None.
FINDINGS: IVC venography demonstrates a normal caliber IVC with no evidence of
thrombus. The IVC filter was successfully positioned below the level
of the renal veins and is appropriately oriented. This IVC filter
has both permanent and retrievable indications.
IMPRESSION: Placement of percutaneous IVC filter in infrarenal IVC. IVC venogram
shows no evidence of IVC thrombus and normal caliber of the inferior
vena cava. This filter does have both permanent and retrievable
indications.

PLAN:
This IVC filter is potentially retrievable. The patient will be
approximately 8-12 weeks. Further recommendations regarding filter
retrieval, continued surveillance or declaration of device
permanence, will be made at that time.

## 2018-09-12 ENCOUNTER — Ambulatory Visit: Payer: Medicaid Other | Attending: Neurology | Admitting: Neurology

## 2018-09-12 DIAGNOSIS — G4733 Obstructive sleep apnea (adult) (pediatric): Secondary | ICD-10-CM | POA: Insufficient documentation

## 2018-09-20 NOTE — Procedures (Signed)
   [facility] Rudolph Daoust A. Gerilyn Pilgrim, MD     [email or web address]             NOCTURNAL POLYSOMNOGRAPHY   LOCATION: [facility]  Patient Name: Bryan [name], Bryan [name] Date: 09/12/2018 Gender: Male D.O.B: [birth year] Age (years): 37 Referring Provider: Jorge Mandril NP Height (inches): 72 Interpreting Physician: Beryle Beams MD, ABSM Weight (lbs): 460 RPSGT: Peak, Robert BMI: 62 MRN: [id number] Neck Size: 23.00 CLINICAL INFORMATION Sleep Study Type: NPSG     Indication for sleep study: OSA     Epworth Sleepiness Score: 5     SLEEP STUDY TECHNIQUE As per the AASM Manual for the Scoring of Sleep and Associated Events v2.3 (April 2016) with a hypopnea requiring 4% desaturations.  The channels recorded and monitored were frontal, central and occipital EEG, electrooculogram (EOG), submentalis EMG (chin), nasal and oral airflow, thoracic and abdominal wall motion, anterior tibialis EMG, snore microphone, electrocardiogram, and pulse oximetry.  MEDICATIONS Medications self-administered by patient taken the night of the study : N/A No current outpatient medications on file.     SLEEP ARCHITECTURE The study was initiated at 9:16:22 PM and ended at 5:00:03 AM.  Sleep onset time was 14.3 minutes and the sleep efficiency was 50.2%%. The total sleep time was 233 minutes.  Stage REM latency was N/A minutes.  The patient spent 5.2%% of the night in stage N1 sleep, 94.0%% in stage N2 sleep, 0.9%% in stage N3 and 0% in REM.  Alpha intrusion was absent.  Supine sleep was 0.00%.  RESPIRATORY PARAMETERS The overall apnea/hypopnea index (AHI) was 87.8 per hour. There were 0 total apneas, including 0 obstructive, 0 central and 0 mixed apneas. There were 341 hypopneas and 0 RERAs.  The AHI during Stage REM sleep was N/A per hour.  AHI while supine was N/A per hour.  The mean oxygen saturation was 82.9%. The minimum SpO2 during sleep was 57.0%.  moderate snoring was  noted during this study.  CARDIAC DATA The 2 lead EKG demonstrated sinus rhythm. The mean heart rate was 90.0 beats per minute. Other EKG findings include: None.   LEG MOVEMENT DATA The total PLMS were 0 with a resulting PLMS index of 0.0. Associated arousal with leg movement index was 0.0.  IMPRESSIONS 1. Severe obstructive sleep apnea is recorded with the study.  AutoPap 10-20 is recommended. 2.  Abnormal sleep architecture is observed with the significantly reduced sleep efficiency, reduced slow-wave sleep and absent REM sleep.   Argie Ramming, MD Diplomate, American Board of Sleep Medicine. ELECTRONICALLY SIGNED ON:  09/20/2018, 8:27 AM [facility] PH: [phone]   FX: [phone] ACCREDITED BY THE AMERICAN ACADEMY OF SLEEP MEDICINE

## 2021-06-05 ENCOUNTER — Emergency Department (HOSPITAL_COMMUNITY): Payer: Medicare Other

## 2021-06-05 ENCOUNTER — Other Ambulatory Visit: Payer: Self-pay

## 2021-06-05 ENCOUNTER — Observation Stay (HOSPITAL_COMMUNITY)
Admission: EM | Admit: 2021-06-05 | Discharge: 2021-06-06 | Disposition: A | Discharge status: Home / Self Care | Payer: Medicare Other | Attending: Emergency Medicine | Admitting: Emergency Medicine

## 2021-06-05 ENCOUNTER — Encounter (HOSPITAL_COMMUNITY): Payer: Self-pay | Admitting: Emergency Medicine

## 2021-06-05 DIAGNOSIS — G40309 Generalized idiopathic epilepsy and epileptic syndromes, not intractable, without status epilepticus: Secondary | ICD-10-CM | POA: Diagnosis not present

## 2021-06-05 DIAGNOSIS — M6281 Muscle weakness (generalized): Secondary | ICD-10-CM | POA: Diagnosis not present

## 2021-06-05 DIAGNOSIS — R569 Unspecified convulsions: Secondary | ICD-10-CM | POA: Diagnosis present

## 2021-06-05 DIAGNOSIS — J9621 Acute and chronic respiratory failure with hypoxia: Secondary | ICD-10-CM | POA: Diagnosis not present

## 2021-06-05 DIAGNOSIS — I10 Essential (primary) hypertension: Secondary | ICD-10-CM | POA: Insufficient documentation

## 2021-06-05 DIAGNOSIS — G4733 Obstructive sleep apnea (adult) (pediatric): Secondary | ICD-10-CM | POA: Insufficient documentation

## 2021-06-05 DIAGNOSIS — E119 Type 2 diabetes mellitus without complications: Secondary | ICD-10-CM | POA: Insufficient documentation

## 2021-06-05 DIAGNOSIS — Z79899 Other long term (current) drug therapy: Secondary | ICD-10-CM | POA: Diagnosis not present

## 2021-06-05 DIAGNOSIS — G40009 Localization-related (focal) (partial) idiopathic epilepsy and epileptic syndromes with seizures of localized onset, not intractable, without status epilepticus: Secondary | ICD-10-CM | POA: Diagnosis not present

## 2021-06-05 DIAGNOSIS — Z9989 Dependence on other enabling machines and devices: Secondary | ICD-10-CM

## 2021-06-05 DIAGNOSIS — Z20822 Contact with and (suspected) exposure to covid-19: Secondary | ICD-10-CM | POA: Diagnosis not present

## 2021-06-05 DIAGNOSIS — J9601 Acute respiratory failure with hypoxia: Secondary | ICD-10-CM

## 2021-06-05 HISTORY — DX: Essential (primary) hypertension: I10

## 2021-06-05 LAB — CBC WITH DIFFERENTIAL/PLATELET
Abs Immature Granulocytes: 0.02 10*3/uL (ref 0.00–0.07)
Basophils Absolute: 0 10*3/uL (ref 0.0–0.1)
Basophils Relative: 0 %
Eosinophils Absolute: 0.2 10*3/uL (ref 0.0–0.5)
Eosinophils Relative: 2 %
HCT: 37.1 % — ABNORMAL LOW (ref 39.0–52.0)
Hemoglobin: 12.4 g/dL — ABNORMAL LOW (ref 13.0–17.0)
Immature Granulocytes: 0 %
Lymphocytes Relative: 16 %
Lymphs Abs: 1.1 10*3/uL (ref 0.7–4.0)
MCH: 31 pg (ref 26.0–34.0)
MCHC: 33.4 g/dL (ref 30.0–36.0)
MCV: 92.8 fL (ref 80.0–100.0)
Monocytes Absolute: 0.6 10*3/uL (ref 0.1–1.0)
Monocytes Relative: 9 %
Neutro Abs: 4.6 10*3/uL (ref 1.7–7.7)
Neutrophils Relative %: 73 %
Platelets: 241 10*3/uL (ref 150–400)
RBC: 4 MIL/uL — ABNORMAL LOW (ref 4.22–5.81)
RDW: 13.5 % (ref 11.5–15.5)
WBC: 6.4 10*3/uL (ref 4.0–10.5)
nRBC: 0 % (ref 0.0–0.2)

## 2021-06-05 LAB — COMPREHENSIVE METABOLIC PANEL
ALT: 21 U/L (ref 0–44)
AST: 29 U/L (ref 15–41)
Albumin: 3.4 g/dL — ABNORMAL LOW (ref 3.5–5.0)
Alkaline Phosphatase: 76 U/L (ref 38–126)
Anion gap: 9 (ref 5–15)
BUN: 16 mg/dL (ref 6–20)
CO2: 31 mmol/L (ref 22–32)
Calcium: 9 mg/dL (ref 8.9–10.3)
Chloride: 97 mmol/L — ABNORMAL LOW (ref 98–111)
Creatinine, Ser: 1.19 mg/dL (ref 0.61–1.24)
GFR, Estimated: >60 mL/min (ref >60)
Glucose, Bld: 147 mg/dL — ABNORMAL HIGH (ref 70–99)
Potassium: 3.2 mmol/L — ABNORMAL LOW (ref 3.5–5.1)
Sodium: 137 mmol/L (ref 135–145)
Total Bilirubin: 0.6 mg/dL (ref 0.3–1.2)
Total Protein: 8.5 g/dL — ABNORMAL HIGH (ref 6.5–8.1)

## 2021-06-05 LAB — RAPID URINE DRUG SCREEN, HOSP PERFORMED
Amphetamines: NOT DETECTED
Barbiturates: NOT DETECTED
Benzodiazepines: NOT DETECTED
Cocaine: NOT DETECTED
Opiates: NOT DETECTED
Tetrahydrocannabinol: NOT DETECTED

## 2021-06-05 LAB — GLUCOSE, CAPILLARY: Glucose-Capillary: 92 mg/dL (ref 70–99)

## 2021-06-05 LAB — RESP PANEL BY RT-PCR (FLU A&B, COVID) ARPGX2
Influenza A by PCR: NEGATIVE
Influenza B by PCR: NEGATIVE
SARS Coronavirus 2 by RT PCR: NEGATIVE

## 2021-06-05 MED ORDER — SODIUM CHLORIDE 0.9 % IV SOLN
750.0000 mg | Freq: Two times a day (BID) | INTRAVENOUS | Status: DC
  Administered 2021-06-05 – 2021-06-06 (×2): 750 mg via INTRAVENOUS
  Filled 2021-06-05 (×6): qty 7.5

## 2021-06-05 MED ORDER — LORAZEPAM 2 MG/ML IJ SOLN
INTRAMUSCULAR | Status: AC
  Administered 2021-06-05: 2 mg via INTRAVENOUS
  Filled 2021-06-05: qty 1

## 2021-06-05 MED ORDER — ACETAMINOPHEN 650 MG RE SUPP: 650.0000 mg | Freq: Four times a day (QID) | RECTAL | Status: DC | PRN

## 2021-06-05 MED ORDER — ONDANSETRON HCL 4 MG PO TABS: 4.0000 mg | ORAL_TABLET | Freq: Four times a day (QID) | ORAL | Status: DC | PRN

## 2021-06-05 MED ORDER — SENNOSIDES-DOCUSATE SODIUM 8.6-50 MG PO TABS: 1.0000 | ORAL_TABLET | Freq: Every evening | ORAL | Status: DC | PRN

## 2021-06-05 MED ORDER — POTASSIUM CHLORIDE 10 MEQ/100ML IV SOLN
10.0000 meq | INTRAVENOUS | Status: AC
  Administered 2021-06-05 – 2021-06-06 (×3): 10 meq via INTRAVENOUS
  Filled 2021-06-05 (×3): qty 100

## 2021-06-05 MED ORDER — LEVETIRACETAM IN NACL 1000 MG/100ML IV SOLN
1000.0000 mg | Freq: Once | INTRAVENOUS | Status: AC
  Administered 2021-06-05: 1000 mg via INTRAVENOUS
  Filled 2021-06-05: qty 100

## 2021-06-05 MED ORDER — LORAZEPAM 2 MG/ML IJ SOLN: 2.0000 mg | Freq: Once | INTRAMUSCULAR | Status: AC

## 2021-06-05 MED ORDER — ACETAMINOPHEN 325 MG PO TABS: 650.0000 mg | ORAL_TABLET | Freq: Four times a day (QID) | ORAL | Status: DC | PRN

## 2021-06-05 MED ORDER — LORAZEPAM 2 MG/ML IJ SOLN: 2.0000 mg | INTRAMUSCULAR | Status: DC | PRN

## 2021-06-05 MED ORDER — INSULIN ASPART 100 UNIT/ML IJ SOLN
0.0000 [IU] | INTRAMUSCULAR | Status: DC
  Administered 2021-06-06: 3 [IU] via SUBCUTANEOUS

## 2021-06-05 MED ORDER — ENOXAPARIN SODIUM 120 MG/0.8ML IJ SOSY
0.5000 mg/kg | PREFILLED_SYRINGE | INTRAMUSCULAR | Status: DC
  Administered 2021-06-05: 114 mg via SUBCUTANEOUS
  Filled 2021-06-05: qty 0.76

## 2021-06-05 MED ORDER — ONDANSETRON HCL 4 MG/2ML IJ SOLN: 4.0000 mg | Freq: Four times a day (QID) | INTRAMUSCULAR | Status: DC | PRN

## 2021-06-05 MED ORDER — LEVETIRACETAM 750 MG PO TABS
750.0000 mg | ORAL_TABLET | Freq: Two times a day (BID) | ORAL | Status: DC
  Filled 2021-06-05: qty 1

## 2021-06-05 NOTE — ED Notes (Signed)
MD & PA @ bedside at this time.

## 2021-06-05 NOTE — ED Triage Notes (Addendum)
Pt bib ems from [facility]; brought in for seizure; hx of same as child, nothing recent, not on seizure meds; postictal on ems arrival; lethargic at [facility]; pt exceeds weight limit for their CT table, brought to [facility] for further eval; nothing given PTA; A and O on arrival, answering questions appropriately  131/75 HR 91 96% 3L (wear 2 at home)

## 2021-06-05 NOTE — ED Provider Notes (Signed)
Patient sent from for new onset seizures this morning.  No history of the same.  History of previous aneurysmal brain bleed requiring craniotomy.  1:13 PM BP (!) 186/85   Pulse (!) 115   Temp 98.4 F (36.9 C) (Oral)   Resp (!) 22   Ht 6' (1.829 m)   Wt (!) 227.3 kg   SpO2 95%   BMI 67.96 kg/m  Patient had another witnessed tonic-clonic seizure.  Mother is at bedside and states that the patient was asleep and then suddenly raise his arms out of the bed and began shaking all over.  He was incontinent of urine and bit his tongue.  Seizure lasted approximately 45 seconds to 1 minute.  Patient given 2 mg IV Ativan and will be given a 2 g loading dose of Keppra.  During the event the patient desaturated to 70% and is currently on 15 L via NRB.  Patient placed in upright position to improve airway status.   Patient here with 3 known seizures today.  Case discussed with Dr. But got of neurology who will consult.  Patient given Keppra loading dose.  I discussed the case with internal medicine teaching service who will admit the patient for seizures.  Patient is beginning to wake up at this time is not quite back to baseline but markedly improved after medication interventions.   Arthor Captain, PA-C 06/05/21 1440    Virgina Norfolk, DO 06/05/21 1518

## 2021-06-05 NOTE — ED Notes (Signed)
Pt to CT at this time.

## 2021-06-05 NOTE — Procedures (Signed)
Patient Name: Bryan [name]  MRN: [id number]  Epilepsy Attending: Charlsie Quest  Referring Physician/Provider: Dr Virgina Norfolk Date: 06/05/2021 Duration: 28.35 mins  Patient history:  40 year old male with history of left P-comm aneurysm status post clipping who presented with new onset seizures. EEG to evaluate for seizure.  Level of alertness: Awake, asleep  AEDs during EEG study: Ativan, Keppra  Technical aspects: This EEG study was done with scalp electrodes positioned according to the 10-20 International system of electrode placement. Electrical activity was acquired at a sampling rate of 500Hz  and reviewed with a high frequency filter of 70Hz  and a low frequency filter of 1Hz . EEG data were recorded continuously and digitally stored.   Description: The posterior dominant rhythm consists of 9 Hz activity of moderate voltage (25-35 uV) seen predominantly in posterior head regions, symmetric and reactive to eye opening and eye closing. Sleep was characterized by vertex waves, sleep spindles (12 to 14 Hz), maximal frontocentral region. Spikes were noted in left > right frontocentral region.  EEG showed intermittent 3 to 5 Hz theta-delta slowing with overriding 15 to 18 Hz beta activity in left frontal region consistent with breach artifact. Generalized, maximal frontocentral 15 to 18 Hz beta activity was also noted.  Hyperventilation and photic stimulation were not performed.     ABNORMALITY - Spike, left > right frontocentral region - Breach artifact, left frontal region - Excessive beta, generalized  IMPRESSION: This study showed evidence of epileptogenicity arising from left more than right frontocentral region. Additionally there is evidence of cortical dysfunction in left frontal region consistent with prior craniotomy.  The excessive beta activity seen in the background is most likely due to the effect of benzodiazepine and is a benign EEG pattern. No seizures were seen throughout  the recording.   Tejas Seawood 

## 2021-06-05 NOTE — ED Triage Notes (Signed)
Pt arrived via [facility] from home. Pts mother stated to EMS she witnessed two grand mal seizures w/ no current hx. Pt had seizures as a child. Pt wears O2 at times when at home @ 2L.

## 2021-06-05 NOTE — ED Notes (Addendum)
Pt reported having a seizure. PA was made aware. Pt placed on non-rebreather.

## 2021-06-05 NOTE — Consult Note (Addendum)
Neurology Consultation Reason for Consult: seizure Referring Physician: Dr Virgina Norfolk  CC: seizure  History is obtained from: Patient, mother at bedside  HPI: Bryan [name] is a 40 y.o. 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  Patient's mother states she heard a loud sound and went to check on him this morning around 5 AM and noticed that he was having full body convulsions.  The first episode stopped but then he immediately had another episode lasting about 3 to 4 minutes.  They called 911.  EMS arrived and patient was taken to [facility].  However patient was large for the CAT scanner at [facility] hospital and therefore was transferred to [facility].  On arrival here around noon he had another generalized tonic-clonic seizure lasting about 2 minutes.  IV Ativan 2 mg was administered and patient was loaded with Keppra 2000 mg.  Neurology was consulted for further management.  Mother states patient had febrile seizures (1 seizure with fever when he was about 39 years old) but denies any prior seizures.  She denies any recent illness, fever, cold, cough, sore throat, alcohol use, any other substance abuse.  She does state that he has had insomnia and was prescribed a medication but did not want to take it due to concern for side effects.  Denies any other recent changes.  Epilepsy risk factors: Reports normal birth and development, denies any history of epilepsy, denies history of meningitis/encephalitis, does have history of P-comm aneurysm status post clipping.  ROS: All other systems reviewed and negative except as noted in the HPI.    Past Medical History:  Diagnosis Date   Complication of anesthesia    mom states they almost lost him but not sure if he was given to much medication   Hypertension    Varicose veins    mutilple bleeding episodes, bilateral lower extremities)   Family history: Denies family history of epilepsy  Social History: Per chart review reports that he has never smoked. His smokeless tobacco use includes chew. He reports current alcohol use. He reports that he does not use drugs.   Exam: Current vital signs: BP 128/68   Pulse (!) 105   Temp 98.4 F (36.9 C) (Oral)   Resp (!) 32   Ht 6' (1.829 m)   Wt (!) 227.3 kg   SpO2 100%   BMI 67.96 kg/m  Vital signs in last 24 hours: Temp:  [98.4 F (36.9 C)-98.5 F (36.9 C)] 98.4 F (36.9 C) (11/09 1029) Pulse Rate:  [86-122] 105 (11/09 1345) Resp:  [19-32] 32 (11/09 1345) BP: (119-186)/(66-86) 128/68 (11/09 1345) SpO2:  [89 %-100 %] 100 % (11/09 1345) Weight:  [204.1 kg-227.3 kg] 227.3 kg (11/09 1028)   Physical Exam  Constitutional: Obese, on nonrebreather Psych: Affect appropriate to situation Eyes: No scleral injection HENT: No OP obstrucion Head: Normocephalic.  Cardiovascular: Normal rate and regular rhythm.  Respiratory: Effort normal, non-labored breathing GI: Soft.  No distension. There is no tenderness.  Skin: Warm, dry skin on bilateral lower extremity with edema Neuro: Awake, alert, oriented to self and place, following simple commands, cranial nerves II to XII appear grossly intact, antigravity strength in upper extremities  I have reviewed  labs in epic and the results pertinent to this consultation are: CBC:  Recent Labs  Lab 06/05/21 0730  WBC 6.4  NEUTROABS 4.6  HGB 12.4*  HCT 37.1*  MCV 92.8  PLT 241    Basic Metabolic Panel:  Lab Results  Component Value Date   NA 137 06/05/2021   K 3.2 (L) 06/05/2021   CO2 31 06/05/2021   GLUCOSE 147 (H) 06/05/2021   BUN 16 06/05/2021   CREATININE 1.19  06/05/2021   CALCIUM 9.0 06/05/2021   GFRNONAA >60 06/05/2021   GFRAA >60 01/05/2017   Lipid Panel: No results found for: LDLCALC HgbA1c: No results found for: HGBA1C Urine Drug Screen:     Component Value Date/Time   LABOPIA NONE DETECTED 06/05/2021 0859   COCAINSCRNUR NONE DETECTED 06/05/2021 0859   LABBENZ NONE DETECTED 06/05/2021 0859   AMPHETMU NONE DETECTED 06/05/2021 0859   THCU NONE DETECTED 06/05/2021 0859   LABBARB NONE DETECTED 06/05/2021 0859    Alcohol Level No results found for: ETH   I have reviewed the images obtained: CT head without contrast 06/05/2021: No acute abnormality. 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 Status post aneurysm clipping in the distribution of the left posterior communicating artery.  ASSESSMENT/PLAN: 40 year old male with history of left P-comm aneurysm status post clipping who presented with new onset seizures.  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 -Recommend starting Keppra 750 mg twice daily from tonight -We will also check urine analysis to look for any provoking factors -We will obtain routine EEG to look for epileptogenicity -Recommend admission to medicine service as patient had multiple seizures today and we are starting a new medication. -If patient remains seizure-free by tomorrow, can most likely be discharged with neurology follow-up in 4 to 6 weeks -Seizure precautions As needed IV Ativan 2 mg for clinical seizure-like activity -Management of rest of comorbidities per primary team   Thank you for allowing [place] to participate in the care of this  patient. If you have any further questions, please contact  me or neurohospitalist.   Lindie Spruce Epilepsy [facility]

## 2021-06-05 NOTE — ED Provider Notes (Signed)
I personally evaluated the patient during the encounter and completed a history, physical, procedures, medical decision making to contribute to the overall care of the patient and decision making for the patient briefly, the patient is a 40 y.o. male from [facility] for head CT after seizure episode.  History of brain aneurysm.  Not on seizure medications.  Sounds like he had a seizure this morning and is now back to his baseline.  However my PA has been taking care of him well awaiting his head CT.  He was to large to fit in CT scan and any pain and was transferred here for head CT.  He does have an unremarkable head CT.  Does have encephalomalacia.  Lab work is unremarkable.  No leukocytosis.  No fever.  Patient had been at his baseline when he had another seizure-like episode.  He was given Ativan and Keppra.  He was postictal and put on a nonrebreather.  About an hour after this seizure he has now slowly become more responsive.  I am able to have a conversation with him.  Neurology has been consulted.  We will admit him for further work-up of status/new seizure.  This chart was dictated using voice recognition software.  Despite best efforts to proofread,  errors can occur which can change the documentation meaning.    EKG Interpretation None             Virgina Norfolk, DO 06/05/21 1423

## 2021-06-05 NOTE — H&P (Addendum)
Date: 06/05/2021               Patient Name:  Bryan [name] MRN: [id number]  DOB: 03-Dec-1980 Age / Sex: 40 y.o., male   PCP: Richmond Campbell., PA-C         Medical Service: Internal Medicine Teaching Service         Attending Physician: Dr. Mayford Knife, Dorene Ar, MD    First Contact: Dr. Ruben Im Pager: [phone]  Second Contact: Dr. Kirke Corin Pager: [phone]       After Hours (After 5p/  First Contact Pager: [phone]  weekends / holidays): Second Contact Pager: [phone]   Chief Complaint: Generalized Tonic-clonic Seizure  History of Present Illness: Bryan [name] is a 40 y.o. 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  Patient's mother states she heard a loud sound and went to check on him this morning around 5 AM and noticed that he was having full body convulsions.  He was lying in bed and did not sustain a fall.  The first episode stopped but then he immediately had another episode lasting about 3 to 4 minutes.  They called 911.  EMS arrived and patient was taken to [facility].  However patient's weight exceeded capacity of [facility] scanner at [facility] and therefore was transferred to [facility].  On arrival here around noon he had another generalized tonic-clonic seizure lasting about 2 minutes with bowel/bladder incontinence and tongue biting.  IV Ativan 2 mg was administered and patient was loaded with Keppra 2000 mg.  Neurology was consulted for further management.  Mother states patient had history of childhood febrile seizures (about 3 seizures with fever when he was about 40 years old) but denies subsequent  seizures.  Denies ever being diagnosed with epilepsy or being on any antiepileptic medication.  Denies any recent illness, fever, cold, cough, sore throat, alcohol use, any other substance abuse. Denies recent falls or syncope.  Meds: Current Meds  Medication Sig   amLODipine (NORVASC) 5 MG tablet Take 5 mg by mouth daily.   furosemide (LASIX) 40 MG tablet Take 40 mg by mouth daily.   hydrochlorothiazide (HYDRODIURIL) 25 MG tablet Take 25 mg by mouth daily.   Multiple Vitamin (MULTIVITAMIN) capsule Take 1 capsule by mouth daily.   phentermine 15 MG capsule Take 15 mg by mouth every morning.   potassium chloride (KLOR-CON) 10 MEQ tablet Take 10 mEq by mouth daily.     Allergies: Allergies as of 06/05/2021 - Review Complete 06/05/2021  Allergen Reaction Noted   Bee venom Anaphylaxis 08/29/2020   Penicillins Hives and Other (See Comments) 09/01/2012   Phenobarbital  06/05/2021   Past Medical History:  Diagnosis Date   Complication of anesthesia    mom states they almost lost him but not sure if he was given to much medication   Hypertension    Varicose veins    mutilple bleeding episodes, bilateral lower extremities)    Family History: Mother: Prediabetes; HTN                 Father: deceased form natural causes  Social History: Patient is currently disabled; lives at home with his mother.  Denies alcohol use  or illicit drug use.  Patient is a non-smoker however uses dip tobacco.  Patient is able to ambulate and perform his ADLs and IADLs.  In the setting of leg pain patient will use a cane for assistance.  Review of Systems: A complete ROS was negative except as per HPI.   Physical Exam: Blood pressure 113/79, pulse 89, temperature 98.4 F (36.9 C), temperature source Oral, resp. rate (!) 28, height 6' (1.829 m), weight (!) 227.3 kg, SpO2 100 %. Physical Exam Constitutional:      Interventions: Nasal cannula in place.     Comments: Patient was intermittently answering  questions while dozing off to sleep.  Patient had difficulty articulating his words due to tongue bite.  Patient is a morbidly obese male lying in a 45 degree angle in bed.  Patient is malodorous with audible breathing.  HENT:     Head: Normocephalic and atraumatic.     Mouth/Throat:     Tongue: Lesions present.  Eyes:     Extraocular Movements: Extraocular movements intact.     Conjunctiva/sclera: Conjunctivae normal.     Comments: Pupils Equal, Round, Reactive to Light  Neck:     Comments: Acanthosis nigricans around the neck Cardiovascular:     Rate and Rhythm: Normal rate and regular rhythm.     Pulses:          Dorsalis pedis pulses are 2+ on the right side and 2+ on the left side.     Heart sounds: Heart sounds are distant (due to large body habitus).  Pulmonary:     Breath sounds: Examination of the right-upper field reveals rhonchi and rales. Examination of the left-upper field reveals rhonchi and rales. Decreased breath sounds, wheezing, rhonchi and rales present.     Comments: Expiratory wheezes noted.  Increased work of breathing Abdominal:     General: Abdomen is protuberant.     Palpations: Abdomen is soft.  Musculoskeletal:     Right lower leg: 1+ Edema present.     Left lower leg: 1+ Edema present.     Comments: 5 out of 5 strength upper and lower extremities bilaterally  Feet:     Right foot:     Skin integrity: Callus and dry skin present.     Toenail Condition: Right toenails are abnormally thick and long.     Left foot:     Skin integrity: Callus and dry skin present.     Toenail Condition: Left toenails are abnormally thick and long.     Comments: Bilateral lower extremities of the skin appeared extremely dry with plaques of callus. Skin:    General: Skin is warm and dry.  Neurological:     General: No focal deficit present.     Mental Status: He is oriented to person, place, and time and easily aroused.     Sensory: Sensation is intact.     Motor: Motor  function is intact.     Coordination: Finger-Nose-Finger Test normal.  Psychiatric:        Cognition and Memory: Cognition normal.     Comments: Patient was sleepy throughout interview         CT HEAD WITHOUT CONTRAST   TECHNIQUE: Contiguous axial images were obtained from the base of the skull through the vertex without intravenous contrast.   COMPARISON:  None.   FINDINGS: Brain: No evidence of acute infarction, hemorrhage, hydrocephalus, extra-axial collection or mass lesion/mass effect. The encephalomalacia within the left frontal lobe and anterior left temporal  lobe identified. Signs of previous left frontal craniotomy.   Vascular: Status post aneurysm clipping in the distribution of the left posterior communicating artery. No hyperdense vessel or unexpected calcification.   Skull: Previous left frontal craniotomy. No fracture or suspicious lesion.   Sinuses/Orbits: No acute abnormality.   Other: None.   IMPRESSION: 1. No acute intracranial abnormalities. 2. 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 3. Status post aneurysm clipping in the distribution of the left posterior communicating artery.    Assessment & Plan by Problem: Active Problems:   Essential hypertension   Morbid obesity (HCC)   Seizure (HCC)   OSA on CPAP  Generalized tonic-clonic seizure Patient expressed a total of 3 generalized tonic clonic seizures.  Two episodes occurred at home witnessed by the patient's mother.  The third seizure occurred in the ED.  Seizures lasted approximately 2 to 3 minutes.  Patient received 2000 mg Keppra load and 2mg  Ativan.  Neurology was consulted.  CBC insignificant with white count within normal limits.  CMP reassuring with normal creatinine and bun; potassium slightly decreased at 3.2; no anion gap.  UDS negative; COVID-negative.  CT of the head revealed no acute intracranial  abnormalities; status post left frontal craniotomy with encephalomalacia malacia within the left frontal lobe and anterior left temporal lobe; status post aneurysm clipping in the distribution of the left posterior communicating artery.  Results of EEG reveals evidence of epileptogenicity arising from the left more than the right frontocentral region.  No seizures were seen throughout the recording --N.p.o. for now; patient did not pass bedside swallow --Seizure precaution --Start Keppra 750 twice daily from tonight --As needed IV Ativan 2 mg for clinical seizure-like activity --If patient remains seizure-free by tomorrow, patient pending discharge with neurology follow-up in 4 to 6 weeks; per neurology recommendation --HIV results pending --Magnesium levels pending --TSH pending --UA pending Neurology recommendations greatly appreciated  Essential hypertension Patient's home blood pressure regimen includes amlodipine 5 mg daily; HCTZ 25 mg daily; Lasix 40 mg daily.  Currently patient is normotensive. --Restart home meds if patient becomes hypertensive  OSA on CPAP Patient uses CPAP intermittently at night.  Patient requires 2.5 to 3 L of oxygen throughout the day. --[place] O2 PRN -- CPAP at night  Morbid obesity Patient has a BMI of 67.  Patient is able to ambulate and is not bedbound.  Patient able to perform ADLs and IADLs independently.  Last hemoglobin A1c was September 2022 was 6.5%.  Patient does not take any diabetic medications.  Patient reports appetite during interview.  However patient did not pass bedside swallow. Patient home medication include phentermine 15 mg daily.    Dispo: Admit patient to Observation with expected length of stay less than 2 midnights.  Signed: October 2022, MD 06/05/2021, 5:07 PM  Pager: [phone] After 5pm on weekdays and 1pm on weekends: On Call pager: [phone]

## 2021-06-05 NOTE — Discharge Instructions (Addendum)
Transferred to [facility] emergency department for CT head Dr. Lockie Mola excepting

## 2021-06-05 NOTE — Progress Notes (Signed)
EEG complete - results pending 

## 2021-06-05 NOTE — ED Provider Notes (Signed)
[facility] EMERGENCY DEPARTMENT Provider Note   CSN: [id number] Arrival date & time: 06/05/21  [id number]     History Chief Complaint  Patient presents with   Seizures    Bryan [name] is a 40 y.o. male.  Patient has a history of hypertension and obesity patient is also on CPAP machine and uses oxygen 2.5 L during the day.  He does not know what is wrong with his lungs.  Patient had a seizure today according to his mother.  She witnessed a seizure.  When paramedics arrived he was postictal but when he arrived at the ED he was alert and oriented x4.  Patient received no medications from the paramedic  The history is provided by the patient. No language interpreter was used.  Seizures Seizure activity on arrival: no   Seizure type:  Grand mal Preceding symptoms: no sensation of an aura present   Initial focality:  None Episode characteristics: abnormal movements   Postictal symptoms: confusion   Return to baseline: yes   Severity:  Severe Timing:  Once Progression:  Resolved     Past Medical History:  Diagnosis Date   Complication of anesthesia    mom states they almost lost him but not sure if he was given to much medication   Hypertension    Varicose veins    mutilple bleeding episodes, bilateral lower extremities)    Patient Active Problem List   Diagnosis Date Noted   Morbid obesity ([place])    Abnormality of gait    Tracheostomy status ([place])    Oropharyngeal dysphagia    Subarachnoid hemorrhage from aneurysm of left posterior communicating artery ([place]) 12/26/2016   Pressure injury of skin 12/25/2016   Encounter for orogastric (OG) tube placement    Bad headache    Syncope    Endotracheally intubated    Essential hypertension    Respiratory failure with hypoxia (HCC)    SAH (subarachnoid hemorrhage) ([place]) 12/01/2016    Past Surgical History:  Procedure Laterality Date   CRANIOTOMY Left 12/02/2016   Procedure: CRANIOTOMY FOR CLIPPING OF INTRACRANIAL ANEURYSM;   Surgeon: Consuella Lose, MD;  Location: [facility];  Service: Neurosurgery;  Laterality: Left;   IR FLUORO RM 30-60 MIN  12/02/2016   IR IVC FILTER PLMT / S&I /IMG GUID/MOD SED  12/11/2016   IR US GUIDE VASC ACCESS RIGHT  12/02/2016   lipo around heart  at age 89   pt states its bc they could hear his  heart beat   ORIF ANKLE FRACTURE Left 09/02/2012   Procedure: OPEN REDUCTION INTERNAL FIXATION (ORIF) ANKLE FRACTURE;  Surgeon: Garald Balding, MD;  Location: [facility];  Service: Orthopedics;  Laterality: Left;  OPEN REDUCTION INTERNAL FIXATION LEFT DISTAL FIBULA FRACTURE WITH REDUCTION FIXATION DISTAL TIBIA/FIBULAR DIASTASIS    RADIOLOGY WITH ANESTHESIA N/A 12/02/2016   Procedure: RADIOLOGY WITH ANESTHESIA;  Surgeon: Consuella Lose, MD;  Location: [place];  Service: Radiology;  Laterality: N/A;   TRACHEOSTOMY TUBE PLACEMENT N/A 12/15/2016   Procedure: TRACHEOSTOMY;  Surgeon: Izora Gala, MD;  Location: [place];  Service: ENT;  Laterality: N/A;       History reviewed. No pertinent family history.  Social History   Tobacco Use   Smoking status: Never   Smokeless tobacco: Current    Types: Chew  Substance Use Topics   Alcohol use: Yes    Alcohol/week: 0.0 standard drinks    Comment: couple of times a week   Drug use: No    Home  Medications Prior to Admission medications   Medication Sig Start Date End Date Taking? Authorizing Provider  amLODipine (NORVASC) 5 MG tablet Take 5 mg by mouth daily. 05/02/21  Yes [provider]  furosemide (LASIX) 40 MG tablet Take 40 mg by mouth daily. 04/05/21  Yes [provider]  hydrochlorothiazide (HYDRODIURIL) 25 MG tablet Take 25 mg by mouth daily. 05/28/21  Yes [provider]  Multiple Vitamin (MULTIVITAMIN) capsule Take 1 capsule by mouth daily.   Yes [provider]  phentermine 15 MG capsule Take 15 mg by mouth every morning. 03/03/21  Yes [provider]  potassium chloride (KLOR-CON) 10 MEQ tablet Take 10 mEq  by mouth daily. 04/08/21  Yes [provider]    Allergies    Bee venom and Penicillins  Review of Systems   Review of Systems  Constitutional:  Negative for appetite change and fatigue.  HENT:  Negative for congestion, ear discharge and sinus pressure.   Eyes:  Negative for discharge.  Respiratory:  Negative for cough.   Cardiovascular:  Negative for chest pain.  Gastrointestinal:  Negative for abdominal pain and diarrhea.  Genitourinary:  Negative for frequency and hematuria.  Musculoskeletal:  Negative for back pain.  Skin:  Negative for rash.  Neurological:  Positive for seizures. Negative for headaches.  Psychiatric/Behavioral:  Negative for hallucinations.    Physical Exam Updated Vital Signs BP (!) 152/82   Pulse (!) 105   Temp 98.5 F (36.9 C) (Oral)   Resp (!) 23   Ht 6' (1.829 m)   Wt (!) 204.1 kg   SpO2 97%   BMI 61.03 kg/m   Physical Exam Vitals and nursing note reviewed.  Constitutional:      Appearance: He is well-developed.  HENT:     Head: Normocephalic.     Nose: Nose normal.  Eyes:     General: No scleral icterus.    Conjunctiva/sclera: Conjunctivae normal.  Neck:     Thyroid: No thyromegaly.  Cardiovascular:     Rate and Rhythm: Normal rate and regular rhythm.     Heart sounds: No murmur heard.   No friction rub. No gallop.  Pulmonary:     Breath sounds: No stridor. No wheezing or rales.  Chest:     Chest wall: No tenderness.  Abdominal:     General: There is no distension.     Tenderness: There is no abdominal tenderness. There is no rebound.  Musculoskeletal:        General: Normal range of motion.     Cervical back: Neck supple.  Lymphadenopathy:     Cervical: No cervical adenopathy.  Skin:    Findings: No erythema or rash.  Neurological:     Mental Status: He is alert and oriented to person, place, and time.     Motor: No abnormal muscle tone.     Coordination: Coordination normal.  Psychiatric:        Behavior:  Behavior normal.    ED Results / Procedures / Treatments   Labs (all labs ordered are listed, but only abnormal results are displayed) Labs Reviewed  CBC WITH DIFFERENTIAL/PLATELET - Abnormal; Notable for the following components:      Result Value   RBC 4.00 (*)    Hemoglobin 12.4 (*)    HCT 37.1 (*)    All other components within normal limits  COMPREHENSIVE METABOLIC PANEL - Abnormal; Notable for the following components:   Potassium 3.2 (*)    Chloride 97 (*)  Glucose, Bld 147 (*)    Total Protein 8.5 (*)    Albumin 3.4 (*)    All other components within normal limits  RAPID URINE DRUG SCREEN, HOSP PERFORMED    EKG None  Radiology No results found.  Procedures Procedures   Medications Ordered in ED Medications - No data to display  ED Course  I have reviewed the triage vital signs and the nursing notes.  Pertinent labs & imaging results that were available during my care of the patient were reviewed by me and considered in my medical decision making (see chart for details).    MDM Rules/Calculators/A&P                           Patient with new onset seizures.  Patient is too large for a CT scan here at any [facility].  He will be transferred to [facility] for a CT scan of his head and then disposition.  Dr. Ronnald Nian excepting Final Clinical Impression(s) / ED Diagnoses Final diagnoses:  None    Rx / DC Orders ED Discharge Orders     None        Milton Ferguson, MD 06/05/21 [phone]

## 2021-06-05 NOTE — ED Notes (Signed)
EEG at bedside.

## 2021-06-05 NOTE — ED Notes (Signed)
PER CT pt exceeds weight requirements for our CT table. Pt will have to be transferred if CT is needed. MD made aware .

## 2021-06-06 DIAGNOSIS — G40009 Localization-related (focal) (partial) idiopathic epilepsy and epileptic syndromes with seizures of localized onset, not intractable, without status epilepticus: Secondary | ICD-10-CM | POA: Diagnosis not present

## 2021-06-06 DIAGNOSIS — J9601 Acute respiratory failure with hypoxia: Secondary | ICD-10-CM

## 2021-06-06 DIAGNOSIS — G40309 Generalized idiopathic epilepsy and epileptic syndromes, not intractable, without status epilepticus: Secondary | ICD-10-CM | POA: Diagnosis not present

## 2021-06-06 LAB — URINALYSIS, ROUTINE W REFLEX MICROSCOPIC
Bilirubin Urine: NEGATIVE
Glucose, UA: NEGATIVE mg/dL
Hgb urine dipstick: NEGATIVE
Ketones, ur: 5 mg/dL — AB
Nitrite: POSITIVE — AB
Protein, ur: 30 mg/dL — AB
Specific Gravity, Urine: 1.023 (ref 1.005–1.030)
pH: 6 (ref 5.0–8.0)

## 2021-06-06 LAB — BASIC METABOLIC PANEL
Anion gap: 9 (ref 5–15)
BUN: 9 mg/dL (ref 6–20)
CO2: 29 mmol/L (ref 22–32)
Calcium: 9 mg/dL (ref 8.9–10.3)
Chloride: 101 mmol/L (ref 98–111)
Creatinine, Ser: 0.94 mg/dL (ref 0.61–1.24)
GFR, Estimated: >60 mL/min (ref >60)
Glucose, Bld: 88 mg/dL (ref 70–99)
Potassium: 3.6 mmol/L (ref 3.5–5.1)
Sodium: 139 mmol/L (ref 135–145)

## 2021-06-06 LAB — CBC
HCT: 37.8 % — ABNORMAL LOW (ref 39.0–52.0)
Hemoglobin: 12 g/dL — ABNORMAL LOW (ref 13.0–17.0)
MCH: 29.9 pg (ref 26.0–34.0)
MCHC: 31.7 g/dL (ref 30.0–36.0)
MCV: 94.3 fL (ref 80.0–100.0)
Platelets: 241 10*3/uL (ref 150–400)
RBC: 4.01 MIL/uL — ABNORMAL LOW (ref 4.22–5.81)
RDW: 13.2 % (ref 11.5–15.5)
WBC: 6.6 10*3/uL (ref 4.0–10.5)
nRBC: 0 % (ref 0.0–0.2)

## 2021-06-06 LAB — POCT I-STAT EG7
Acid-Base Excess: 7 mmol/L — ABNORMAL HIGH (ref 0.0–2.0)
Bicarbonate: 34.1 mmol/L — ABNORMAL HIGH (ref 20.0–28.0)
Calcium, Ion: 1.22 mmol/L (ref 1.15–1.40)
HCT: 36 % — ABNORMAL LOW (ref 39.0–52.0)
Hemoglobin: 12.2 g/dL — ABNORMAL LOW (ref 13.0–17.0)
O2 Saturation: 88 %
Potassium: 3.5 mmol/L (ref 3.5–5.1)
Sodium: 141 mmol/L (ref 135–145)
TCO2: 36 mmol/L — ABNORMAL HIGH (ref 22–32)
pCO2, Ven: 58.3 mmHg (ref 44.0–60.0)
pH, Ven: 7.375 (ref 7.250–7.430)
pO2, Ven: 57 mmHg — ABNORMAL HIGH (ref 32.0–45.0)

## 2021-06-06 LAB — GLUCOSE, CAPILLARY
Glucose-Capillary: 102 mg/dL — ABNORMAL HIGH (ref 70–99)
Glucose-Capillary: 112 mg/dL — ABNORMAL HIGH (ref 70–99)
Glucose-Capillary: 127 mg/dL — ABNORMAL HIGH (ref 70–99)
Glucose-Capillary: 92 mg/dL (ref 70–99)

## 2021-06-06 LAB — HIV ANTIBODY (ROUTINE TESTING W REFLEX): HIV Screen 4th Generation wRfx: NONREACTIVE

## 2021-06-06 LAB — HEMOGLOBIN A1C
Hgb A1c MFr Bld: 6.5 % — ABNORMAL HIGH (ref 4.8–5.6)
Mean Plasma Glucose: 139.85 mg/dL

## 2021-06-06 LAB — VITAMIN B12: Vitamin B-12: 291 pg/mL (ref 180–914)

## 2021-06-06 LAB — MAGNESIUM: Magnesium: 2.3 mg/dL (ref 1.7–2.4)

## 2021-06-06 LAB — TSH: TSH: 0.667 u[IU]/mL (ref 0.350–4.500)

## 2021-06-06 MED ORDER — POTASSIUM CHLORIDE 10 MEQ/100ML IV SOLN
10.0000 meq | Freq: Once | INTRAVENOUS | Status: AC
  Administered 2021-06-06: 10 meq via INTRAVENOUS
  Filled 2021-06-06: qty 100

## 2021-06-06 MED ORDER — AMLODIPINE BESYLATE 5 MG PO TABS
5.0000 mg | ORAL_TABLET | Freq: Every day | ORAL | Status: DC
  Administered 2021-06-06: 5 mg via ORAL
  Filled 2021-06-06: qty 1

## 2021-06-06 MED ORDER — ENOXAPARIN SODIUM 120 MG/0.8ML IJ SOSY
115.0000 mg | PREFILLED_SYRINGE | INTRAMUSCULAR | Status: DC
  Filled 2021-06-06: qty 0.76

## 2021-06-06 MED ORDER — SULFAMETHOXAZOLE-TRIMETHOPRIM 800-160 MG PO TABS: 1.0000 | ORAL_TABLET | Freq: Two times a day (BID) | ORAL | 0 refills | Status: AC

## 2021-06-06 MED ORDER — LEVETIRACETAM 750 MG PO TABS
750.0000 mg | ORAL_TABLET | Freq: Two times a day (BID) | ORAL | 0 refills | Status: DC
Start: 2021-06-06 — End: 2022-09-01

## 2021-06-06 MED ORDER — FUROSEMIDE 40 MG PO TABS
40.0000 mg | ORAL_TABLET | Freq: Every day | ORAL | Status: DC
  Administered 2021-06-06: 40 mg via ORAL
  Filled 2021-06-06: qty 1

## 2021-06-06 MED ORDER — HYDROCHLOROTHIAZIDE 25 MG PO TABS
25.0000 mg | ORAL_TABLET | Freq: Every day | ORAL | Status: DC
  Administered 2021-06-06: 25 mg via ORAL
  Filled 2021-06-06: qty 1

## 2021-06-06 MED ORDER — ORAL CARE MOUTH RINSE
15.0000 mL | Freq: Two times a day (BID) | OROMUCOSAL | Status: DC
  Administered 2021-06-06: 15 mL via OROMUCOSAL

## 2021-06-06 MED ORDER — CHLORHEXIDINE GLUCONATE 0.12 % MT SOLN
15.0000 mL | Freq: Two times a day (BID) | OROMUCOSAL | Status: DC
  Administered 2021-06-06: 15 mL via OROMUCOSAL

## 2021-06-06 NOTE — Progress Notes (Signed)
Patient discharged via wheel chair at this time. Patient hemodynamically stable during discharge.  °

## 2021-06-06 NOTE — Progress Notes (Signed)
Discharge instruction given to the patient and his mother and both stated understanding. New medications explained and stated understanding. IV and cardiac monitor discontinued. Questions and concern answered.

## 2021-06-06 NOTE — Plan of Care (Signed)
  Problem: Education: Goal: Expressions of having a comfortable level of knowledge regarding the disease process will increase Outcome: Adequate for Discharge   Problem: Coping: Goal: Ability to adjust to condition or change in health will improve Outcome: Adequate for Discharge Goal: Ability to identify appropriate support needs will improve Outcome: Adequate for Discharge   Problem: Health Behavior/Discharge Planning: Goal: Compliance with prescribed medication regimen will improve Outcome: Adequate for Discharge   Problem: Medication: Goal: Risk for medication side effects will decrease Outcome: Adequate for Discharge   Problem: Clinical Measurements: Goal: Complications related to the disease process, condition or treatment will be avoided or minimized Outcome: Adequate for Discharge Goal: Diagnostic test results will improve Outcome: Adequate for Discharge   Problem: Safety: Goal: Verbalization of understanding the information provided will improve Outcome: Adequate for Discharge   Problem: Self-Concept: Goal: Ability to verbalize feelings about condition will improve Outcome: Adequate for Discharge   Problem: Education: Goal: Knowledge of General Education information will improve Description: Including pain rating scale, medication(s)/side effects and non-pharmacologic comfort measures Outcome: Adequate for Discharge   Problem: Health Behavior/Discharge Planning: Goal: Ability to manage health-related needs will improve Outcome: Adequate for Discharge   Problem: Clinical Measurements: Goal: Ability to maintain clinical measurements within normal limits will improve Outcome: Adequate for Discharge Goal: Will remain free from infection Outcome: Adequate for Discharge Goal: Diagnostic test results will improve Outcome: Adequate for Discharge Goal: Respiratory complications will improve Outcome: Adequate for Discharge Goal: Cardiovascular complication will be  avoided Outcome: Adequate for Discharge   Problem: Activity: Goal: Risk for activity intolerance will decrease Outcome: Adequate for Discharge   Problem: Nutrition: Goal: Adequate nutrition will be maintained Outcome: Adequate for Discharge   Problem: Coping: Goal: Level of anxiety will decrease Outcome: Adequate for Discharge   Problem: Elimination: Goal: Will not experience complications related to bowel motility Outcome: Adequate for Discharge Goal: Will not experience complications related to urinary retention Outcome: Adequate for Discharge   Problem: Pain Managment: Goal: General experience of comfort will improve Outcome: Adequate for Discharge   Problem: Safety: Goal: Ability to remain free from injury will improve Outcome: Adequate for Discharge   Problem: Skin Integrity: Goal: Risk for impaired skin integrity will decrease Outcome: Adequate for Discharge

## 2021-06-06 NOTE — Discharge Summary (Addendum)
Name: Bryan [name] MRN: [id number] DOB: 1981/02/22 40 y.o. PCP: Richmond Campbell., PA-C  Date of Admission: 06/05/2021  7:06 AM Date of Discharge: 06/06/21 Attending Physician: Miguel Aschoff, MD  Discharge Diagnosis: 1. Generalized tonic clonic seizure 2. Newly diagnosed T2DM 3. Acute hypoxic respiratory failure 2/2 OSA 4. Essential HTN   Discharge Medications: Allergies as of 06/06/2021       Reactions   Bee Venom Anaphylaxis   Penicillins Hives, Other (See Comments)   Mouth blisters - childhood allergy Has patient had a PCN reaction causing immediate rash, facial/tongue/throat swelling, SOB or lightheadedness with hypotension: Yes Has patient had a PCN reaction causing severe rash involving mucus membranes or skin necrosis: No Has patient had a PCN reaction that required hospitalization Yes Has patient had a PCN reaction occurring within the last 10 years: No If all of the above answers are "NO", then may proceed with Cephalosporin use.   Phenobarbital    unknown        Medication List     TAKE these medications    amLODipine 5 MG tablet Commonly known as: NORVASC Take 5 mg by mouth daily.   furosemide 40 MG tablet Commonly known as: LASIX Take 40 mg by mouth daily.   hydrochlorothiazide 25 MG tablet Commonly known as: HYDRODIURIL Take 25 mg by mouth daily.   levETIRAcetam 750 MG tablet Commonly known as: KEPPRA Take 1 tablet (750 mg total) by mouth 2 (two) times daily.   multivitamin capsule Take 1 capsule by mouth daily.   phentermine 15 MG capsule Take 15 mg by mouth every morning.   potassium chloride 10 MEQ tablet Commonly known as: KLOR-CON Take 10 mEq by mouth daily.        Disposition and follow-up:   Bryan [name] was discharged from [facility] in Good condition.  At the hospital follow up visit please address:  1.  Generalized tonic colonic siezure- Follow up with Neurology in 4-6 weeks; seizure  precaution Newly diagnosed T2DM- Follow up with PCP;  start diabetic medication regimen, specifically consider GLP-1  Morbid obesity-candidate for GLP-1 for weight loss  2.  Labs / imaging needed at time of follow-up: Urine analysis  3.  Pending labs/ test needing follow-up: Urine analysis  Follow-up Appointments:  Follow-up Information     Richmond Campbell., PA-C Follow up in 1 week(s).   Specialty: Family Medicine Contact information: [address] [place] [address] [phone]         [facility] Follow up in 4 week(s).   Contact information: [address] [place], [address] [place] [address] [phone]                Hospital Course by problem list: 1.  Generalized tonic-clonic seizure-Patient experienced a total of 3 generalized tonic clonic seizures.  Two episodes occurred at home witnessed by the patient's mother.  The third seizure occurred in the ED.  Seizures lasted approximately 2 to 3 minutes.  Patient received 2000 mg Keppra load and 2mg  Ativan.  Neurology was consulted.  CBC insignificant with white count within normal limits.  CMP reassuring with normal creatinine and bun; potassium slightly decreased at 3.2; no anion gap.  UDS negative; COVID-negative.  CT of the head revealed no acute intracranial abnormalities; status post left frontal craniotomy with encephalomalacia  within the left frontal lobe and anterior left temporal lobe; status post aneurysm clipping in the distribution of the left posterior communicating artery.  Results of EEG reveals evidence of epileptogenicity arising from the left more than the right frontocentral region.  No seizures were seen throughout the recording.  Overnight patient was stable and had no further seizure-like activity.  On day of discharge, patient reports no complaints.  Patient feels like he is at his baseline. Neurology cleared patient for discharge.  2.  Newly diagnosed T2DM- on  admission A1c was obtained.  A1c 6.5%.  Patient denied any symptoms during hospital course.  Patient is to follow-up outpatient.  3.  Acute hypoxic respiratory failure 2/2 OSA with CPAP-on arrival to the ED, patient experienced witnessed generalized tonic clonic seizure.  Following the seizure episode patient oxygen saturations dropped into the 70s.  Patient was placed on nonrebreather.  Patient was then placed on HFNC, oxygen saturation improved >97% and patient was placed on [place] O2 3 L. Overnight patient used CPAP machine. During the day patient was placed on home regimen 3 L of oxygen.  Patient is stable and does not complain of difficulty breathing.  4.  Essential hypertension- On arrival patient was hypertensive at 186/85.  Patient's blood pressure trended down without medication.  The next day patient was started on home regimen of amlodipine 5mg , furosemide 40 mg, hydrochlorothiazide 25 mg daily.   Discharge Exam:   BP 140/70 (BP Location: Right Arm)   Pulse 96   Temp 98.7 F (37.1 C) (Axillary)   Resp (!) 22   Ht 6' (1.829 m)   Wt (!) 227.3 kg   SpO2 93%   BMI 67.96 kg/m  Discharge exam: Physical Exam Constitutional:      General: He is awake.     Appearance: He is morbidly obese.     Interventions: Nasal cannula in place.  HENT:     Head: Normocephalic and atraumatic.  Eyes:     General: Lids are normal.  Cardiovascular:     Rate and Rhythm: Normal rate and regular rhythm.     Heart sounds: Normal heart sounds.     Comments: Dry, plaque-like callus on bilateral lower extremities.  Pulmonary:     Effort: Pulmonary effort is normal.     Breath sounds: Normal breath sounds.  Musculoskeletal:     Right lower leg: Edema present.     Left lower leg: Edema present.  Feet:     Right foot:     Skin integrity: Callus and dry skin present.     Toenail Condition: Right toenails are abnormally thick.     Left foot:     Skin integrity: Callus and dry skin present.     Toenail  Condition: Left toenails are abnormally thick.  Skin:    General: Skin is warm and dry.  Neurological:     General: No focal deficit present.     Mental Status: He is alert and oriented to person, place, and time. Mental status is at baseline.     Motor: No seizure activity.     Gait: Gait is intact.  Psychiatric:        Mood and Affect: Mood normal.        Behavior: Behavior is cooperative.        Cognition and Memory: Cognition normal.     Pertinent Labs, Studies, and Procedures:  CBC Latest Ref Rng & Units 06/06/2021 06/05/2021 06/05/2021  WBC 4.0 - 10.5 K/uL 6.6 - 6.4  Hemoglobin 13.0 - 17.0 g/dL 12.0(L) 12.2(L) 12.4(L)  Hematocrit 39.0 - 52.0 % 37.8(L) 36.0(L) 37.1(L)  Platelets 150 -  400 K/uL 241 - 241   CMP Latest Ref Rng & Units 06/06/2021 06/05/2021 06/05/2021  Glucose 70 - 99 mg/dL 88 - 147(H)  BUN 6 - 20 mg/dL 9 - 16  Creatinine 0.61 - 1.24 mg/dL 0.94 - 1.19  Sodium 135 - 145 mmol/L 139 141 137  Potassium 3.5 - 5.1 mmol/L 3.6 3.5 3.2(L)  Chloride 98 - 111 mmol/L 101 - 97(L)  CO2 22 - 32 mmol/L 29 - 31  Calcium 8.9 - 10.3 mg/dL 9.0 - 9.0  Total Protein 6.5 - 8.1 g/dL - - 8.5(H)  Total Bilirubin 0.3 - 1.2 mg/dL - - 0.6  Alkaline Phos 38 - 126 U/L - - 76  AST 15 - 41 U/L - - 29  ALT 0 - 44 U/L - - 21   CT HEAD WITHOUT CONTRAST   TECHNIQUE: Contiguous axial images were obtained from the base of the skull through the vertex without intravenous contrast.   COMPARISON:  None.   FINDINGS: Brain: No evidence of acute infarction, hemorrhage, hydrocephalus, extra-axial collection or mass lesion/mass effect. The encephalomalacia within the left frontal lobe and anterior left temporal lobe identified. Signs of previous left frontal craniotomy.   Vascular: Status post aneurysm clipping in the distribution of the left posterior communicating artery. No hyperdense vessel or unexpected calcification.   Skull: Previous left frontal craniotomy. No fracture or  suspicious lesion.   Sinuses/Orbits: No acute abnormality.   Other: None.   IMPRESSION: 1. No acute intracranial abnormalities. 2. 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 3. Status post aneurysm clipping in the distribution of the left posterior communicating artery.  Discharge Instructions: Discharge Instructions     Call MD for:  difficulty breathing, headache or visual disturbances   Complete by: As directed    Call MD for:  extreme fatigue   Complete by: As directed    Call MD for:  hives   Complete by: As directed    Call MD for:  persistant dizziness or light-headedness   Complete by: As directed    Call MD for:  persistant nausea and vomiting   Complete by: As directed    Call MD for:  redness, tenderness, or signs of infection (pain, swelling, redness, odor or green/yellow discharge around incision site)   Complete by: As directed    Call MD for:  severe uncontrolled pain   Complete by: As directed    Call MD for:  temperature >100.4   Complete by: As directed    Diet - low sodium heart healthy   Complete by: As directed    Increase activity slowly   Complete by: As directed       Please take your Keppra 750 mg twice daily.  Please do not miss any doses.  It is important to take this medication as prescribed.  Please do not drive.  You have a recent history of seizure and currently on antiseizure medications.  It is unsafe for you to drive at this time.  Please follow-up with your neurology appointment in 4 weeks  Please follow-up with your primary care doctor appointment in 1 week   Signed: Timothy Lasso, MD 06/06/2021, 2:17 PM   Pager: [phone]

## 2021-06-06 NOTE — Evaluation (Signed)
Physical Therapy Evaluation Patient Details Name: Bryan [name] MRN: [id number] DOB: 10-11-1980 Today's Date: 06/06/2021  History of Present Illness  Bryan [name] is a 40 y.o. 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  Clinical Impression  Patient presents with decreased independence with mobility due to generalized weakness and bedrest.  Currently min A overall for up to EOB and hallway ambulation.  Feel he will continue to progress with skilled PT in the acute setting.  Likely not to need follow up PT at d/c.         Recommendations for follow up therapy are one component of a multi-disciplinary discharge planning process, led by the attending physician.  Recommendations may be updated based on patient status, additional functional criteria and insurance authorization.  Follow Up Recommendations No PT follow up    Assistance Recommended at Discharge    Functional Status Assessment Patient has had a recent decline in their functional status and demonstrates the ability to make significant improvements in function in a reasonable and predictable amount of time.  Equipment Recommendations  None recommended by PT    Recommendations for Other Services       Precautions / Restrictions Precautions Precautions: Fall Precaution Comments: seizure      Mobility  Bed Mobility Overal bed mobility: Needs Assistance Bed Mobility: Supine to Sit     Supine to sit: Min assist;HOB elevated     General bed mobility comments: to lift trunk in bari bed    Transfers Overall transfer level: Needs assistance Equipment used: None Transfers:  Sit to/from Stand Sit to Stand: Min guard           General transfer comment: for balance    Ambulation/Gait Ambulation/Gait assistance: Supervision;Min guard Gait Distance (Feet): 150 Feet Assistive device: None Gait Pattern/deviations: Step-through pattern;Decreased stride length;Wide base of support       General Gait Details: increased lateral sway hitting obstacles at times due to body habitus  Stairs            Wheelchair Mobility    Modified Rankin (Stroke Patients Only)       Balance Overall balance assessment: Needs assistance   Sitting balance-Leahy Scale: Good       Standing balance-Leahy Scale: Good Standing balance comment: moves around a lot in standing, minimal static standing due to body habitus                             Pertinent Vitals/Pain Pain Assessment: Faces Faces Pain Scale: Hurts a little bit Pain Location: lingual pain Pain Descriptors / Indicators: Grimacing;Guarding Pain Intervention(s): Monitored during session    Home Living Family/patient expects to be discharged to:: Private residence Living Arrangements: Parent (mom) Available Help at Discharge: Family Type of Home: Mobile home Home Access: Stairs to enter Entrance Stairs-Rails: Doctor, general practice of Steps: 5   Home Layout: One level Home Equipment: BSC/3in1      Prior Function Prior Level of Function : Independent/Modified Independent             Mobility Comments: helps with chores, 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       Hand Dominance   Dominant  Hand: Right    Extremity/Trunk Assessment   Upper Extremity Assessment Upper Extremity Assessment: LUE deficits/detail LUE Deficits / Details: shoulder elevation strength 2/5 had injury as a youth with surgery    Lower Extremity Assessment Lower Extremity Assessment: Overall WFL for tasks assessed (some limtations due to body habitus)    Cervical / Trunk  Assessment Cervical / Trunk Assessment: Other exceptions Cervical / Trunk Exceptions: increased body habitus  Communication   Communication: No difficulties  Cognition Arousal/Alertness: Awake/alert Behavior During Therapy: WFL for tasks assessed/performed Overall Cognitive Status: Within Functional Limits for tasks assessed                                   Functional Status Assessment: Patient has not had a recent decline in their functional status      General Comments      Exercises     Assessment/Plan    PT Assessment Patient needs continued PT services  PT Problem List Decreased mobility;Decreased activity tolerance;Decreased balance       PT Treatment Interventions Therapeutic activities;Gait training;Therapeutic exercise;Patient/family education;Stair training;Functional mobility training;Balance training    PT Goals (Current goals can be found in the Care Plan section)  Acute Rehab PT Goals Patient Stated Goal: to return to independent PT Goal Formulation: With patient Time For Goal Achievement: 06/20/21 Potential to Achieve Goals: Good    Frequency Min 3X/week   Barriers to discharge        Co-evaluation               AM-PAC PT "6 Clicks" Mobility  Outcome Measure Help needed turning from your back to your side while in a flat bed without using bedrails?: A Little Help needed moving from lying on your back to sitting on the side of a flat bed without using bedrails?: A Little Help needed moving to and from a bed to a chair (including a wheelchair)?: A Little Help needed standing up from a chair using your arms (e.g., wheelchair or bedside chair)?: A Little Help needed to walk in hospital room?: A Little Help needed climbing 3-5 steps with a railing? : A Lot 6 Click Score: 17    End of Session Equipment Utilized During Treatment: Oxygen Activity Tolerance: Patient tolerated treatment well Patient left: in bed   PT Visit  Diagnosis: Muscle weakness (generalized) (M62.81)    Time: 0935-1000 PT Time Calculation (min) (ACUTE ONLY): 25 min   Charges:   PT Evaluation $PT Eval Moderate Complexity: 1 Mod PT Treatments $Gait Training: 8-22 mins        Sheran Lawless, PT Acute Rehabilitation Services [phone] Office:[phone] 06/06/2021   Bryan [name] 06/06/2021, 10:11 AM

## 2021-06-06 NOTE — Progress Notes (Signed)
Subjective: No further seizures overnight.  Patient denies any new concerns.  Does not remember having seizures yesterday.  ROS: negative except above  Examination  Vital signs in last 24 hours: Temp:  [98.7 F (37.1 C)-98.8 F (37.1 C)] 98.7 F (37.1 C) (11/10 0438) Pulse Rate:  [89-105] 96 (11/10 0817) Resp:  [17-32] 22 (11/10 0817) BP: (78-140)/(59-112) 140/70 (11/10 0817) SpO2:  [93 %-100 %] 93 % (11/10 0817)  General: lying in bed, NAD CVS: pulse-normal rate and rhythm RS: breathing comfortably, on nasal cannula Extremities: normal, +edema Neuro: AOx3, follows all commands, no evidence of aphasia, cranial nerves II through grossly intact, antigravity strength in upper extremities  Basic Metabolic Panel: Recent Labs  Lab 06/05/21 0730 06/05/21 1208 06/06/21 0319  NA 137 141 139  K 3.2* 3.5 3.6  CL 97*  --  101  CO2 31  --  29  GLUCOSE 147*  --  88  BUN 16  --  9  CREATININE 1.19  --  0.94  CALCIUM 9.0  --  9.0  MG  --   --  2.3    CBC: Recent Labs  Lab 06/05/21 0730 06/05/21 1208 06/06/21 0319  WBC 6.4  --  6.6  NEUTROABS 4.6  --   --   HGB 12.4* 12.2* 12.0*  HCT 37.1* 36.0* 37.8*  MCV 92.8  --  94.3  PLT 241  --  241     Coagulation Studies: No results for input(s): LABPROT, INR in the last 72 hours.  Imaging No new brain imaging overnight  ASSESSMENT AND PLAN:  40 year old male with history of left P-comm aneurysm status post clipping who presented with new onset seizures.   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   Recommendations: -Continue Keppra 750 mg twice daily -If urine analysis is bland, patient can be discharged  -Recommend neurology follow-up with Dr Gerilyn Pilgrim at [facility] in 4 to 6 weeks -Seizure precautions were discussed  including do not drive for 6 months -Medication side effects were discussed  Seizure precautions: Per [facility] statutes, patients with seizures are not allowed to drive until they have been seizure-free for six months and cleared by a physician    Use caution when using heavy equipment or power tools. Avoid working on ladders or at heights. Take showers instead of baths. Ensure the water temperature is not too high on the home water heater. Do not go swimming alone. Do not lock yourself in a room alone (i.e. bathroom). When caring for infants or small children, sit down when holding, feeding, or changing them to minimize risk of injury to the child in the event you have a seizure. Maintain good sleep hygiene. Avoid alcohol.    If patient has another seizure, call 911 and bring them back to the ED if: A.  The seizure lasts longer than 5 minutes.      B.  The patient doesn't wake shortly after the seizure or has new problems such as difficulty seeing, speaking or moving following the seizure C.  The patient was injured during the seizure D.  The patient has a temperature over 102 F (39C) E.  The patient vomited during the seizure and now is having trouble breathing    During the Seizure   - First, ensure adequate ventilation and place patients on the floor  on their left side  Loosen clothing around the neck and ensure the airway is patent. If the patient is clenching the teeth, do not force the mouth open with any object as this can cause severe damage - Remove all items from the surrounding that can be hazardous. The patient may be oblivious to what's happening and may not even know what he or she is doing. If the patient is confused and wandering, either gently guide him/her away and block access to outside areas - Reassure the individual and be comforting - Call 911. In most cases, the seizure ends before EMS arrives. However, there are cases when seizures may last over 3 to 5  minutes. Or the individual may have developed breathing difficulties or severe injuries. If a pregnant patient or a person with diabetes develops a seizure, it is prudent to call an ambulance. - Finally, if the patient does not regain full consciousness, then call EMS. Most patients will remain confused for about 45 to 90 minutes after a seizure, so you must use judgment in calling for help. - Avoid restraints but make sure the patient is in a bed with padded side rails - Place the individual in a lateral position with the neck slightly flexed; this will help the saliva drain from the mouth and prevent the tongue from falling backward - Remove all nearby furniture and other hazards from the area - Provide verbal assurance as the individual is regaining consciousness - Provide the patient with privacy if possible - Call for help and start treatment as ordered by the caregiver    After the Seizure (Postictal Stage)   After a seizure, most patients experience confusion, fatigue, muscle pain and/or a headache. Thus, one should permit the individual to sleep. For the next few days, reassurance is essential. Being calm and helping reorient the person is also of importance.   Most seizures are painless and end spontaneously. Seizures are not harmful to others but can lead to complications such as stress on the lungs, brain and the heart. Individuals with prior lung problems may develop labored breathing and respiratory distress.   I have spent a total of  38 minutes with the patient reviewing hospital notes,  test results, labs and examining the patient as well as establishing an assessment and plan that was discussed personally with the patient.  > 50% of time was spent in direct patient care.     Lindie Spruce Epilepsy [facility] For questions after 5pm please refer to AMION to reach the Neurologist on call

## 2021-06-06 NOTE — Evaluation (Signed)
Clinical/Bedside Swallow Evaluation Patient Details  Name: Bryan [name] MRN: [id number] Date of Birth: 1980-09-07  Today's Date: 06/06/2021 Time: SLP Start Time (ACUTE ONLY): 0845 SLP Stop Time (ACUTE ONLY): 0915 SLP Time Calculation (min) (ACUTE ONLY): 30 min  Past Medical History:  Past Medical History:  Diagnosis Date   Complication of anesthesia    mom states they almost lost him but not sure if he was given to much medication   Hypertension    Varicose veins    mutilple bleeding episodes, bilateral lower extremities)   Past Surgical History:  Past Surgical History:  Procedure Laterality Date   CRANIOTOMY Left 12/02/2016   Procedure: CRANIOTOMY FOR CLIPPING OF INTRACRANIAL ANEURYSM;  Surgeon: Lisbeth Renshaw, MD;  Location: [facility] OR;  Service: Neurosurgery;  Laterality: Left;   IR FLUORO RM 30-60 MIN  12/02/2016   IR IVC FILTER PLMT / S&I /IMG GUID/MOD SED  12/11/2016   IR US GUIDE VASC ACCESS RIGHT  12/02/2016   lipo around heart  at age 71   pt states its bc they could hear his  heart beat   ORIF ANKLE FRACTURE Left 09/02/2012   Procedure: OPEN REDUCTION INTERNAL FIXATION (ORIF) ANKLE FRACTURE;  Surgeon: Valeria Batman, MD;  Location: [facility] OR;  Service: Orthopedics;  Laterality: Left;  OPEN REDUCTION INTERNAL FIXATION LEFT DISTAL FIBULA FRACTURE WITH REDUCTION FIXATION DISTAL TIBIA/FIBULAR DIASTASIS    RADIOLOGY WITH ANESTHESIA N/A 12/02/2016   Procedure: RADIOLOGY WITH ANESTHESIA;  Surgeon: Lisbeth Renshaw, MD;  Location: [facility] OR;  Service: Radiology;  Laterality: N/A;   TRACHEOSTOMY TUBE PLACEMENT N/A 12/15/2016   Procedure: TRACHEOSTOMY;  Surgeon: Serena Colonel, MD;  Location: [facility] OR;  Service: ENT;  Laterality: N/A;   HPI:  40yo male admitted 06/05/21 for multiple seizures. 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 HeadCT = no acute abnormalities.    Assessment / Plan /  Recommendation  Clinical Impression  Pt seen at bedside for clinical assessment of swallow function and safety. Pt's oral cavity was slightly dry, due to open mouth breathing. He has adequate natural dentition. CN exam unremarkable. Lingual strength and ROM is currently limited due to swelling and tenderness related to biting his tongue during a seizure. Oral care was completed with suction. This was uncomfortable for pt due to lingual tenderness. Pt accepted trials of ice chips, thin liquid, puree, and solid textures. All consistencies were tolerated well. Extended oral prep of solid texture noted, likely due to lingual tenderness. No overt s/s aspiration observed on any consistency despite multiple trials. Will begin with dys 3 (mech soft) diet and thin liquids. Anticipate easily advancing diet to regular when lingual swelling and pain subside. SLP will follow briefly to assess readiness to advance textures. Safe swallow precautions posted at [facility]. RN and MD informed.  SLP Visit Diagnosis: Dysphagia, unspecified (R13.10)    Aspiration Risk  Mild aspiration risk    Diet Recommendation Dysphagia 3 (Mech soft);Thin liquid   Liquid Administration via: Cup;Straw Medication Administration: Whole meds with liquid Supervision: Patient able to self feed;Intermittent supervision to cue for compensatory strategies Compensations: Slow rate;Small sips/bites Postural Changes: Seated upright at 90 degrees;Remain upright for at least 30 minutes after po intake    Other  Recommendations Oral Care Recommendations: Oral care BID    Recommendations for follow up therapy are one component of a multi-disciplinary discharge planning process, led by the attending physician.  Recommendations  may be updated based on patient status, additional functional criteria and insurance authorization.  Follow up Recommendations No SLP follow up      Assistance Recommended at Discharge None  Functional Status Assessment  Patient has not had a recent decline in their functional status  Frequency and Duration min 1 x/week  1 week;2 weeks       Prognosis Prognosis for Safe Diet Advancement: Good      Swallow Study   General Date of Onset: 06/05/21 HPI: 40yo male admitted 06/05/21 for multiple seizures. 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 HeadCT = no acute abnormalities. Type of Study: Bedside Swallow Evaluation Previous Swallow Assessment: FEES 01/01/17 = reg/thin Diet Prior to this Study: NPO Temperature Spikes Noted: No Respiratory Status: Nasal cannula History of Recent Intubation: No Behavior/Cognition: Alert;Cooperative;Pleasant mood Oral Cavity Assessment: Within Functional Limits Oral Care Completed by SLP: Yes Oral Cavity - Dentition: Adequate natural dentition Vision: Functional for self-feeding Self-Feeding Abilities: Able to feed self;Needs set up Patient Positioning: Partially reclined (pt was positioned as upright as bed would allow) Baseline Vocal Quality: Normal Volitional Cough: Strong Volitional Swallow: Able to elicit    Oral/Motor/Sensory Function Overall Oral Motor/Sensory Function: Within functional limits   Ice Chips Ice chips: Within functional limits Presentation: Spoon   Thin Liquid Thin Liquid: Within functional limits Presentation: Straw    Nectar Thick Nectar Thick Liquid: Not tested   Honey Thick Honey Thick Liquid: Not tested   Puree Puree: Within functional limits Presentation: Self Fed;Spoon   Solid     Solid: Impaired Presentation: Self Fed;Spoon Oral Phase Impairments: Impaired mastication Oral Phase Functional Implications: Impaired mastication;Prolonged oral transit Other Comments: difficulty with solids due to lingual swelling/pain     Bryan [name], [facility], CCC-SLP Speech Language Pathologist Office: [phone]  Leigh Aurora 06/06/2021,9:26 AM

## 2021-06-06 NOTE — Care Management Obs Status (Signed)
MEDICARE OBSERVATION STATUS NOTIFICATION   Patient Details  Name: Bryan [name] MRN: [id number] Date of Birth: 21-Jan-1981   Medicare Observation Status Notification Given:  Yes    Lorri Frederick, LCSW 06/06/2021, 1:37 PM

## 2021-06-09 ENCOUNTER — Telehealth: Payer: Self-pay | Admitting: Internal Medicine

## 2021-06-09 NOTE — Telephone Encounter (Signed)
-----   Message from Miguel Aschoff, MD sent at 06/09/2021  2:21 PM EST ----- Lu Duffel, Would you please review the discharge meds and the discussion about HTN in the d/c summary? The text mentions restarting hydralazine 25 at discharge, though this isn't in the med list.  There is HCTZ 25 mg in the med list, but not  mentioned in the text.  Perhaps you mean HCTZ rather than hydralazine? Please let me know when complete and I'll sign. Thanks!

## 2022-07-10 ENCOUNTER — Ambulatory Visit: Payer: Medicare Other | Admitting: Neurology

## 2022-07-16 ENCOUNTER — Ambulatory Visit: Payer: Medicare Other | Admitting: Neurology

## 2022-09-01 ENCOUNTER — Ambulatory Visit (INDEPENDENT_AMBULATORY_CARE_PROVIDER_SITE_OTHER): Payer: Medicare Other | Admitting: Neurology

## 2022-09-01 ENCOUNTER — Encounter: Payer: Self-pay | Admitting: Neurology

## 2022-09-01 VITALS — BP 131/101 | HR 64 | Ht 72.0 in | Wt >= 6400 oz

## 2022-09-01 DIAGNOSIS — E559 Vitamin D deficiency, unspecified: Secondary | ICD-10-CM | POA: Diagnosis not present

## 2022-09-01 DIAGNOSIS — G40909 Epilepsy, unspecified, not intractable, without status epilepticus: Secondary | ICD-10-CM | POA: Diagnosis not present

## 2022-09-01 DIAGNOSIS — Z5181 Encounter for therapeutic drug level monitoring: Secondary | ICD-10-CM | POA: Diagnosis not present

## 2022-09-01 MED ORDER — LEVETIRACETAM 750 MG PO TABS: 750.0000 mg | ORAL_TABLET | Freq: Two times a day (BID) | ORAL | 4 refills | Status: AC

## 2022-09-01 NOTE — Patient Instructions (Signed)
Continue with Keppra 750 mg twice daily  We will check a Keppra level today  Follow up in a year or sooner if worse

## 2022-09-01 NOTE — Progress Notes (Signed)
[facility]  PATIENT: Bryan [name] DOB: 08-08-1980  REQUESTING CLINICIAN: Phillips Odor, MD HISTORY FROM: Patient  REASON FOR VISIT: Establish care for his epilepsy    HISTORICAL  CHIEF COMPLAINT:  Chief Complaint  Patient presents with   New Patient (Initial Visit)    Rm 12. Patient alone. [place] office was closing, NP recommended. Patient has hx of seizures/aneurysms. Patient states last seizure last year, does not remember specific dates. Paramedics were called and he was transferred to [facility]. No additional concerns at this time    HISTORY OF PRESENT ILLNESS:  This is a 42 year old gentleman past medical history of ruptured aneurysm status post clipping, seizure disorder, obesity, who is presenting to establish care.  Patient reported in November 2022 he had a cluster of 4-5 seizures, described them as generalized seizure. This was his first ever seizure. He was admitted to [facility], EEG showed bifrontal spikes and CT scan did not show any acute abnormality but there was encephalomalacia and postsurgical changes.  At that time he was started on Keppra 750 mg twice daily and since then has not had any additional seizures.  He reports good compliance with the medicine, denies any side effect.  He is here to establish care as his previous neurologist office is closing.  No other complaints.   Handedness: ambidextrous  Onset: Nov 2022  Seizure Type: Generalized convulsion   Current frequency: Clusters on November 9 but none since started on Keppra   Any injuries from seizures: Denies   Seizure risk factors: Aneurysm rupture s/p clipping   Previous ASMs: Levetiracetam   Currenty ASMs: Levetiracetam 750 mg twice daily   ASMs side effects: None   Brain Images: Status post aneurysm clipping, encephalomalacia   Previous EEGs: Bifrontal spikes    OTHER MEDICAL CONDITIONS: Aneurysm rupture, status post clipping, Obesity,   REVIEW OF  SYSTEMS: Full 14 system review of systems performed and negative with exception of: As noted in the HPI   ALLERGIES: Allergies  Allergen Reactions   Bee Venom Anaphylaxis   Penicillins Hives and Other (See Comments)    Mouth blisters - childhood allergy Has patient had a PCN reaction causing immediate rash, facial/tongue/throat swelling, SOB or lightheadedness with hypotension: Yes Has patient had a PCN reaction causing severe rash involving mucus membranes or skin necrosis: No Has patient had a PCN reaction that required hospitalization Yes Has patient had a PCN reaction occurring within the last 10 years: No If all of the above answers are "NO", then may proceed with Cephalosporin use.   Phenobarbital     unknown    HOME MEDICATIONS: Outpatient Medications Prior to Visit  Medication Sig Dispense Refill   amLODipine (NORVASC) 5 MG tablet Take 5 mg by mouth daily.     furosemide (LASIX) 40 MG tablet Take 40 mg by mouth daily.     hydrochlorothiazide (HYDRODIURIL) 25 MG tablet Take 25 mg by mouth daily.     Multiple Vitamin (MULTIVITAMIN) capsule Take 1 capsule by mouth daily.     OZEMPIC, 1 MG/DOSE, 4 MG/3ML SOPN Inject 1 mg into the skin once a week.     phentermine 15 MG capsule Take 15 mg by mouth every morning.     potassium chloride (KLOR-CON) 10 MEQ tablet Take 10 mEq by mouth daily.     levETIRAcetam (KEPPRA) 750 MG tablet Take 1 tablet (750 mg total) by mouth 2 (two) times daily. 90 tablet 0   No facility-administered medications prior to visit.  PAST MEDICAL HISTORY: Past Medical History:  Diagnosis Date   Complication of anesthesia    mom states they almost lost him but not sure if he was given to much medication   Hypertension    Varicose veins    mutilple bleeding episodes, bilateral lower extremities)    PAST SURGICAL HISTORY: Past Surgical History:  Procedure Laterality Date   CRANIOTOMY Left 12/02/2016   Procedure: CRANIOTOMY FOR CLIPPING OF INTRACRANIAL  ANEURYSM;  Surgeon: Consuella Lose, MD;  Location: [place];  Service: Neurosurgery;  Laterality: Left;   IR FLUORO RM 30-60 MIN  12/02/2016   IR IVC FILTER PLMT / S&I /IMG GUID/MOD SED  12/11/2016   IR US GUIDE VASC ACCESS RIGHT  12/02/2016   lipo around heart  at age 84   pt states its bc they could hear his  heart beat   ORIF ANKLE FRACTURE Left 09/02/2012   Procedure: OPEN REDUCTION INTERNAL FIXATION (ORIF) ANKLE FRACTURE;  Surgeon: Garald Balding, MD;  Location: [place];  Service: Orthopedics;  Laterality: Left;  OPEN REDUCTION INTERNAL FIXATION LEFT DISTAL FIBULA FRACTURE WITH REDUCTION FIXATION DISTAL TIBIA/FIBULAR DIASTASIS    RADIOLOGY WITH ANESTHESIA N/A 12/02/2016   Procedure: RADIOLOGY WITH ANESTHESIA;  Surgeon: Consuella Lose, MD;  Location: [place];  Service: Radiology;  Laterality: N/A;   TRACHEOSTOMY TUBE PLACEMENT N/A 12/15/2016   Procedure: TRACHEOSTOMY;  Surgeon: Izora Gala, MD;  Location: [place];  Service: ENT;  Laterality: N/A;    FAMILY HISTORY: History reviewed. No pertinent family history.  SOCIAL HISTORY: Social History   Socioeconomic History   Marital status: Single    Spouse name: Not on file   Number of children: Not on file   Years of education: Not on file   Highest education level: Not on file  Occupational History   Not on file  Tobacco Use   Smoking status: Never   Smokeless tobacco: Current    Types: Chew  Substance and Sexual Activity   Alcohol use: Yes    Alcohol/week: 0.0 standard drinks of alcohol    Comment: couple of times a week   Drug use: No   Sexual activity: Never  Other Topics Concern   Not on file  Social History Narrative   Not on file   Social Determinants of Health   Financial Resource Strain: Not on file  Food Insecurity: Not on file  Transportation Needs: Not on file  Physical Activity: Not on file  Stress: Not on file  Social Connections: Not on file  Intimate Partner Violence: Not on file    PHYSICAL  EXAM  GENERAL EXAM/CONSTITUTIONAL: Vitals:  Vitals:   09/01/22 1044  BP: (!) 131/101  Pulse: 64  Weight: (!) 507 lb (230 kg)  Height: 6' (1.829 m)   Body mass index is 68.76 kg/m. Wt Readings from Last 3 Encounters:  09/01/22 (!) 507 lb (230 kg)  06/05/21 (!) 501 lb 1.7 oz (227.3 kg)  12/25/16 (!) 417 lb (189.1 kg)   Patient is in no distress; well developed, nourished and groomed; neck is supple  EYES: Visual fields full to confrontation, Extraocular movements intacts,  No results found.  MUSCULOSKELETAL: Gait, strength, tone, movements noted in Neurologic exam below  NEUROLOGIC: MENTAL STATUS:      No data to display         awake, alert, oriented to person, place and time recent and remote memory intact normal attention and concentration language fluent, comprehension intact, naming intact fund of knowledge appropriate  CRANIAL NERVE:  2nd, 3rd, 4th, 6th - Visual fields full to confrontation, extraocular muscles intact, no nystagmus 5th - facial sensation symmetric 7th - facial strength symmetric 8th - hearing intact 9th - palate elevates symmetrically, uvula midline 11th - shoulder shrug symmetric 12th - tongue protrusion midline  MOTOR:  normal bulk and tone, full strength in the BUE, BLE  SENSORY:  normal and symmetric to light touch  COORDINATION:  finger-nose-finger, fine finger movements normal  REFLEXES:  deep tendon reflexes present and symmetric  GAIT/STATION:  normal   DIAGNOSTIC DATA (LABS, IMAGING, TESTING) - I reviewed patient records, labs, notes, testing and imaging myself where available.  Lab Results  Component Value Date   WBC 6.6 06/06/2021   HGB 12.0 (L) 06/06/2021   HCT 37.8 (L) 06/06/2021   MCV 94.3 06/06/2021   PLT 241 06/06/2021      Component Value Date/Time   NA 139 06/06/2021 0319   K 3.6 06/06/2021 0319   CL 101 06/06/2021 0319   CO2 29 06/06/2021 0319   GLUCOSE 88 06/06/2021 0319   BUN 9 06/06/2021  0319   CREATININE 0.94 06/06/2021 0319   CALCIUM 9.0 06/06/2021 0319   PROT 8.5 (H) 06/05/2021 0730   ALBUMIN 3.4 (L) 06/05/2021 0730   AST 29 06/05/2021 0730   ALT 21 06/05/2021 0730   ALKPHOS 76 06/05/2021 0730   BILITOT 0.6 06/05/2021 0730   GFRNONAA >60 06/06/2021 0319   GFRAA >60 01/05/2017 0607   Lab Results  Component Value Date   TRIG 120 12/05/2016   Lab Results  Component Value Date   HGBA1C 6.5 (H) 06/06/2021   Lab Results  Component Value Date   VITAMINB12 291 06/06/2021   Lab Results  Component Value Date   TSH 0.667 06/06/2021    Routine EEG 06/05/2021 - Spike, left > right frontocentral region - Breach artifact, left frontal region - Excessive beta, generalized   CT Head 06/05/2021 1. No acute intracranial abnormalities. 2. 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 3. Status post aneurysm clipping in the distribution of the left posterior communicating artery.  I personally reviewed brain Images and previous EEG reports.   ASSESSMENT AND PLAN  42 y.o. year old male  with history of at his aneurysm rupture status post clipping and seizure disorder who is presenting to establish care.  His seizures are well controlled with Keppra 750 mg twice daily.  His last seizure was in November 22 when he was admitted in the hospital.  Since then has been doing well.  Will continue patient on Keppra 750 mg twice daily, I will check Keppra level and we will see him in 1 year for follow-up or sooner if worse. He voices understanding.    1. Nonintractable epilepsy without status epilepticus, unspecified epilepsy type ([place])   2. Therapeutic drug monitoring   3. Vitamin D deficiency, unspecified     Patient Instructions  Continue with Keppra 750 mg twice daily  We will check a Keppra level today  Follow up in a year or sooner if worse     Per [facility] statutes, patients with  seizures are not allowed to drive until they have been seizure-free for six months.  Other recommendations include using caution when using heavy equipment or power tools. Avoid working on ladders or at heights. Take showers instead of baths.  Do not swim alone.  Ensure the water temperature is not too  high on the home water heater. Do not go swimming alone. Do not lock yourself in a room alone (i.e. bathroom). When caring for infants or small children, sit down when holding, feeding, or changing them to minimize risk of injury to the child in the event you have a seizure. Maintain good sleep hygiene. Avoid alcohol.  Also recommend adequate sleep, hydration, good diet and minimize stress.   During the Seizure  - First, ensure adequate ventilation and place patients on the floor on their left side  Loosen clothing around the neck and ensure the airway is patent. If the patient is clenching the teeth, do not force the mouth open with any object as this can cause severe damage - Remove all items from the surrounding that can be hazardous. The patient may be oblivious to what's happening and may not even know what he or she is doing. If the patient is confused and wandering, either gently guide him/her away and block access to outside areas - Reassure the individual and be comforting - Call 911. In most cases, the seizure ends before EMS arrives. However, there are cases when seizures may last over 3 to 5 minutes. Or the individual may have developed breathing difficulties or severe injuries. If a pregnant patient or a person with diabetes develops a seizure, it is prudent to call an ambulance. - Finally, if the patient does not regain full consciousness, then call EMS. Most patients will remain confused for about 45 to 90 minutes after a seizure, so you must use judgment in calling for help. - Avoid restraints but make sure the patient is in a bed with padded side rails - Place the individual in a lateral  position with the neck slightly flexed; this will help the saliva drain from the mouth and prevent the tongue from falling backward - Remove all nearby furniture and other hazards from the area - Provide verbal assurance as the individual is regaining consciousness - Provide the patient with privacy if possible - Call for help and start treatment as ordered by the caregiver   After the Seizure (Postictal Stage)  After a seizure, most patients experience confusion, fatigue, muscle pain and/or a headache. Thus, one should permit the individual to sleep. For the next few days, reassurance is essential. Being calm and helping reorient the person is also of importance.  Most seizures are painless and end spontaneously. Seizures are not harmful to others but can lead to complications such as stress on the lungs, brain and the heart. Individuals with prior lung problems may develop labored breathing and respiratory distress.     Orders Placed This Encounter  Procedures   Levetiracetam level   Vitamin D, 25-hydroxy    Meds ordered this encounter  Medications   levETIRAcetam (KEPPRA) 750 MG tablet    Sig: Take 1 tablet (750 mg total) by mouth 2 (two) times daily.    Dispense:  180 tablet    Refill:  4    Return in about 1 year (around 09/02/2023).    Windell Norfolk, MD 09/01/2022, 11:27 AM  [facility] [address], [address] [place], [place] [address] [phone]

## 2022-09-02 LAB — VITAMIN D 25 HYDROXY (VIT D DEFICIENCY, FRACTURES): Vit D, 25-Hydroxy: 24.2 ng/mL — ABNORMAL LOW (ref 30.0–100.0)

## 2022-09-02 LAB — LEVETIRACETAM LEVEL: Levetiracetam Lvl: <2 ug/mL — ABNORMAL LOW (ref 10.0–40.0)

## 2022-09-02 NOTE — Progress Notes (Signed)
Please call and advise the patient that the recent [place] level was undetected in the blood. Please advise him to take the medication as prescribed. Please also advise him to start over the counter Vitamin D supplement.  Please remind patient to keep any upcoming appointments or tests and to call us with any interim questions, concerns, problems or updates. Thanks,   Alric Ran, MD

## 2022-09-04 ENCOUNTER — Telehealth: Payer: Self-pay

## 2022-09-04 NOTE — Telephone Encounter (Signed)
Contacted pt back, informed him recent Keppra level was undetected in the blood. He stated he has been taking Keppra as prescribed. Reiterated to take the medication as prescribed 750MG  BID. Also advise him to start over the counter Vitamin D supplement, pt verbally understood. Advised to call the office back with any questions or concerns as he had none at this time and was appreciative.

## 2022-09-04 NOTE — Telephone Encounter (Signed)
Contacted pt, LVM rq CB 

## 2022-09-04 NOTE — Telephone Encounter (Signed)
Pt returned call. Please call back when available. 

## 2022-09-04 NOTE — Telephone Encounter (Signed)
-----   Message from Alric Ran, MD sent at 09/02/2022  5:46 PM EST ----- Please call and advise the patient that the recent [place] level was undetected in the blood. Please advise him to take the medication as prescribed. Please also advise him to start over the counter Vitamin D supplement.  Please remind patient to keep any upcoming appointments or tests and to call us with any interim questions, concerns, problems or updates. Thanks,   Alric Ran, MD

## 2023-09-07 ENCOUNTER — Ambulatory Visit: Payer: Medicare Other | Admitting: Neurology

## 2023-09-07 ENCOUNTER — Encounter: Payer: Self-pay | Admitting: Neurology

## 2024-08-16 ENCOUNTER — Telehealth: Payer: Self-pay

## 2024-08-16 ENCOUNTER — Telehealth: Payer: Self-pay | Admitting: Neurology

## 2024-08-16 NOTE — Transitions of Care (Post Inpatient/ED Visit) (Signed)
" ° °  08/16/2024  Name: Bryan [name] MRN: [id number] DOB: 1981-04-01  Today's TOC FU Call Status: Today's TOC FU Call Status:: Successful TOC FU Call Completed TOC FU Call Complete Date: 08/16/24  Patient's Name and Date of Birth confirmed. Name, DOB  Transition Care Management Follow-up Telephone Call Date of Discharge: 08/15/24 Discharge Facility: Other [facility]) Name of Other (Non[facility]) Discharge Facility: [facility] Type of Discharge: Inpatient Admission Primary Inpatient Discharge Diagnosis:: CHF How have you been since you were released from the hospital?: Better Any questions or concerns?: No (Patient states his current PCP is with [facility] and does not see Dr. Zollie.)  Patient states PCP is with [facility]. Will sign off.    Richerd Fish, RN, BSN, CCM [facility], [facility] Management Coordinator Direct Dial: [phone]        "

## 2024-08-16 NOTE — Telephone Encounter (Signed)
 Received sleep referral from [facility] pt with OSA on CPAP, needing new CPAP. Noted mention used to see Dr. Vinson office for this which shut down, placed in sleep referrals box

## 2024-08-24 ENCOUNTER — Encounter: Payer: Self-pay | Admitting: *Deleted

## 2024-08-24 NOTE — Progress Notes (Signed)
 Bryan [name]                                          MRN: [id number]   08/24/2024   The [facility] Quality Team Specialist reviewed this patient medical record for the purposes of chart review for care gap closure. The following were reviewed: abstraction for care gap closure-glycemic status assessment.    [facility] Quality Team
